# Patient Record
Sex: Female | Born: 1935 | Race: White | Hispanic: No | Smoking: Former smoker
Health system: Southern US, Community
[De-identification: ages and names within clinical notes are randomized; demographics above are authoritative.]

## PROBLEM LIST (undated history)

## (undated) DIAGNOSIS — R002 Palpitations: Secondary | ICD-10-CM

## (undated) DIAGNOSIS — B029 Zoster without complications: Secondary | ICD-10-CM

## (undated) DIAGNOSIS — J189 Pneumonia, unspecified organism: Secondary | ICD-10-CM

## (undated) DIAGNOSIS — K602 Anal fissure, unspecified: Secondary | ICD-10-CM

## (undated) DIAGNOSIS — H409 Unspecified glaucoma: Secondary | ICD-10-CM

## (undated) DIAGNOSIS — E785 Hyperlipidemia, unspecified: Secondary | ICD-10-CM

## (undated) DIAGNOSIS — R0602 Shortness of breath: Secondary | ICD-10-CM

## (undated) DIAGNOSIS — R079 Chest pain, unspecified: Secondary | ICD-10-CM

## (undated) DIAGNOSIS — E039 Hypothyroidism, unspecified: Secondary | ICD-10-CM

## (undated) DIAGNOSIS — I10 Essential (primary) hypertension: Secondary | ICD-10-CM

## (undated) DIAGNOSIS — E119 Type 2 diabetes mellitus without complications: Secondary | ICD-10-CM

## (undated) DIAGNOSIS — E038 Other specified hypothyroidism: Secondary | ICD-10-CM

## (undated) DIAGNOSIS — K219 Gastro-esophageal reflux disease without esophagitis: Secondary | ICD-10-CM

## (undated) DIAGNOSIS — I493 Ventricular premature depolarization: Secondary | ICD-10-CM

## (undated) DIAGNOSIS — L719 Rosacea, unspecified: Secondary | ICD-10-CM

## (undated) HISTORY — DX: Hyperlipidemia, unspecified: E78.5

## (undated) HISTORY — DX: Unspecified glaucoma: H40.9

## (undated) HISTORY — DX: Ventricular premature depolarization: I49.3

## (undated) HISTORY — DX: Zoster without complications: B02.9

## (undated) HISTORY — DX: Other specified hypothyroidism: E03.8

## (undated) HISTORY — DX: Shortness of breath: R06.02

## (undated) HISTORY — DX: Pneumonia, unspecified organism: J18.9

## (undated) HISTORY — DX: Chest pain, unspecified: R07.9

## (undated) HISTORY — DX: Hypothyroidism, unspecified: E03.9

## (undated) HISTORY — PX: APPENDECTOMY: SHX54

## (undated) HISTORY — PX: TOTAL ABDOMINAL HYSTERECTOMY: SHX209

## (undated) HISTORY — PX: BOWEL RESECTION: SHX1257

## (undated) HISTORY — DX: Palpitations: R00.2

## (undated) HISTORY — DX: Anal fissure, unspecified: K60.2

## (undated) HISTORY — PX: BLADDER REPAIR: SHX76

## (undated) HISTORY — DX: Rosacea, unspecified: L71.9

---

## 1998-10-07 ENCOUNTER — Ambulatory Visit (HOSPITAL_COMMUNITY): Admission: RE | Admit: 1998-10-07 | Discharge: 1998-10-08 | Payer: Self-pay | Admitting: Urology

## 2007-05-21 HISTORY — PX: OTHER SURGICAL HISTORY: SHX169

## 2009-04-03 ENCOUNTER — Encounter: Payer: Self-pay | Admitting: Cardiology

## 2009-04-14 DIAGNOSIS — R002 Palpitations: Secondary | ICD-10-CM | POA: Insufficient documentation

## 2009-04-14 DIAGNOSIS — R0602 Shortness of breath: Secondary | ICD-10-CM | POA: Insufficient documentation

## 2009-04-17 ENCOUNTER — Ambulatory Visit: Payer: Self-pay | Admitting: Cardiology

## 2009-04-17 DIAGNOSIS — E119 Type 2 diabetes mellitus without complications: Secondary | ICD-10-CM | POA: Insufficient documentation

## 2009-04-17 DIAGNOSIS — R079 Chest pain, unspecified: Secondary | ICD-10-CM | POA: Insufficient documentation

## 2009-04-17 DIAGNOSIS — E785 Hyperlipidemia, unspecified: Secondary | ICD-10-CM | POA: Insufficient documentation

## 2009-04-30 ENCOUNTER — Telehealth (INDEPENDENT_AMBULATORY_CARE_PROVIDER_SITE_OTHER): Payer: Self-pay | Admitting: *Deleted

## 2009-05-01 ENCOUNTER — Encounter (HOSPITAL_COMMUNITY): Admission: RE | Admit: 2009-05-01 | Discharge: 2009-06-25 | Payer: Self-pay | Admitting: Cardiology

## 2009-05-01 ENCOUNTER — Ambulatory Visit: Payer: Self-pay

## 2009-05-01 ENCOUNTER — Ambulatory Visit: Payer: Self-pay | Admitting: Cardiology

## 2009-05-01 ENCOUNTER — Encounter: Payer: Self-pay | Admitting: Cardiology

## 2009-05-01 ENCOUNTER — Ambulatory Visit (HOSPITAL_COMMUNITY): Admission: RE | Admit: 2009-05-01 | Discharge: 2009-05-01 | Payer: Self-pay | Admitting: Cardiology

## 2009-05-02 ENCOUNTER — Telehealth (INDEPENDENT_AMBULATORY_CARE_PROVIDER_SITE_OTHER): Payer: Self-pay | Admitting: *Deleted

## 2009-05-02 ENCOUNTER — Ambulatory Visit: Payer: Self-pay | Admitting: Internal Medicine

## 2009-05-05 LAB — CONVERTED CEMR LAB
Eosinophils Relative: 1.2 % (ref 0.0–5.0)
HCT: 38.9 % (ref 36.0–46.0)
Hemoglobin: 13 g/dL (ref 12.0–15.0)
Lymphocytes Relative: 47.3 % — ABNORMAL HIGH (ref 12.0–46.0)
Lymphs Abs: 4.1 10*3/uL — ABNORMAL HIGH (ref 0.7–4.0)
Monocytes Relative: 8.2 % (ref 3.0–12.0)
Platelets: 335 10*3/uL (ref 150.0–400.0)
Sed Rate: 22 mm/hr (ref 0–22)
WBC: 8.6 10*3/uL (ref 4.5–10.5)

## 2009-10-28 ENCOUNTER — Ambulatory Visit: Payer: Self-pay

## 2010-04-23 NOTE — Assessment & Plan Note (Signed)
Summary: Cardiology Nuclear Study  Nuclear Med Background Indications for Stress Test: Evaluation for Ischemia   History: Echo, GXT  History Comments: '07 GXT: no definite abnormalities; 05/01/09 Echo:EF=60-65%  Symptoms: Chest Pain with Exertion, Chest Pressure, Chest Pressure with Exertion, DOE, Palpitations, Rapid HR  Symptoms Comments: Last episode of IO:NGEX night.   Nuclear Pre-Procedure Cardiac Risk Factors: Lipids, NIDDM Caffeine/Decaff Intake: None NPO After: 6:30 PM Lungs: Clear IV 0.9% NS with Angio Cath: 20g     IV Site: (R) AC IV Started by: Irean Hong RN Chest Size (in) 38     Cup Size B     Height (in): 66 Weight (lb): 158 BMI: 25.59 Tech Comments: While waiting of stress images patient c/o throat tightness, chest and abdominal pain.  This was all relieved by the time she was finished with the images.  Patient stated she was feeling fine.  Nuclear Med Study 1 or 2 day study:  1 day     Stress Test Type:  Eugenie Birks Reading MD:  Marca Ancona, MD     Referring MD:  Willa Rough, MD Resting Radionuclide:  Technetium 74m Tetrofosmin     Resting Radionuclide Dose:  10.9 mCi  Stress Radionuclide:  Technetium 54m Tetrofosmin     Stress Radionuclide Dose:  33.0 mCi   Stress Protocol   Lexiscan: 0.4 mg   Stress Test Technologist:  Rea College CMA-N     Nuclear Technologist:  Burna Mortimer Deal RT-N  Rest Procedure  Myocardial perfusion imaging was performed at rest 45 minutes following the intravenous administration of Myoview Technetium 85m Tetrofosmin.  Stress Procedure  The patient received IV Lexiscan 0.4 mg over 15-seconds.  Myoview injected at 30-seconds.  There were nonspecific T-wave changes and frequent PVC's with LBBB morphology with lexiscan.  Quantitative spect images were obtained after a 45 minute delay.  QPS Raw Data Images:  Normal; no motion artifact; normal heart/lung ratio. Stress Images:  NI: Uniform and normal uptake of tracer in all myocardial  segments. Rest Images:  Normal homogeneous uptake in all areas of the myocardium. Subtraction (SDS):  There is no evidence of scar or ischemia. Transient Ischemic Dilatation:  .87  (Normal <1.22)  Lung/Heart Ratio:  .28  (Normal <0.45)  Quantitative Gated Spect Images QGS EDV:  62 ml QGS ESV:  20 ml QGS EF:  69 % QGS cine images:  Normal wall motion   Overall Impression  Exercise Capacity: Lexiscan stress BP Response: Normal blood pressure response. Clinical Symptoms: Nausea, dizzy with infusion. Throat tightness while waiting for stress images.  ECG Impression: Intermittent left bundle with infusion.  Overall Impression: No evidence for ischemia or infarction Overall Impression Comments: Intermittent left bundle with infusion.   Appended Document: Cardiology Nuclear Study Good  Appended Document: Cardiology Nuclear Study Left message to call back   Appended Document: Cardiology Nuclear Study spoke w/pt she is aware of results

## 2010-04-23 NOTE — Progress Notes (Signed)
Summary: Nuclear Pre-Procedure  Phone Note Outgoing Call Call back at [facility] Phone [phone]   Call placed by: Stanton Kidney, EMT-P,  April 30, 2009 3:00 PM Call placed to: Patient Action Taken: Phone Call Completed Summary of Call: Reviewed information on Myoview Information Sheet (see scanned document for further details).  Spoke with Patient.    Nuclear Med Background Indications for Stress Test: Evaluation for Ischemia   History: GXT  History Comments: '07 GXT: no definite abnormalities  Symptoms: Chest Pain with Exertion, DOE, Palpitations    Nuclear Pre-Procedure Cardiac Risk Factors: Lipids, NIDDM Height (in): 66

## 2010-04-23 NOTE — Assessment & Plan Note (Signed)
Summary: NP6/PALPS/SOB/PER DR Angelina Ok PT TO SEE DR Myrtis Ser   Visit Type:  Initial Consult Primary Provider:  Harrison Mons, MD  CC:  chest pain.  History of Present Illness: The patient is seen to evaluate chest pain and palpitations.  I have excellent information from her primary physician, Dr.Shaw.  The patient has diabetes.  She tells me that she had a standard exercise test in 2007.  At that time there was no definite abnormality.  More recently she feels palpitations.  She has not had syncope or presyncope.  She also says that she feels uncomfortable if she rushes with her activities.  If she climbs a set of stairs rapidly she feels some shortness of breath and an unusual sensation in her throat.  This passes with time.  There is no radiation of discomfort.  There is no nausea vomiting or diaphoresis.  The patient does have diabetes.  Current Medications (verified): 1)  Aspirin 81 Mg Tbec (Aspirin) .... Take One Tablet By Mouth Daily 2)  Co Q-10 30 Mg  Caps (Coenzyme Q10) .... 50 Mg Once Daily 3)  Fish Oil   Oil (Fish Oil) .Marland Kitchen.. 1000 Mg Once Daily 4)  Vitamin E 600 Unit  Caps (Vitamin E) .... 400 International Units Once Daily 5)  Tetrahydrazoline Hcl 0.05 % Soln (Tetrahydrozoline Hcl) .... As Directed 6)  Glucophage 1000 Mg Tabs (Metformin Hcl) .... Two Times A Day 7)  Singulair 10 Mg Tabs (Montelukast Sodium) .... Once Daily 8)  Metrocream 0.75 % Crea (Metronidazole) .... Two Times A Day 9)  Lumigan 0.03 % Soln (Bimatoprost) .... At Bedtime  Allergies (verified): 1)  ! Sulfa 2)  ! Pcn 3)  ! 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  Alcohol Use - no Drug Use - no Full Time---- CNA (ederly /  alzheimers pts)  Review of Systems       Patient denies fever, chills, headache, sweats, rash, change in vision, change in hearing, cough, nausea vomiting, urinary symptoms, musculoskeletal problems.  All other systems are reviewed and are negative.  Vital Signs:  Patient profile:   77 year old female Height:      66 inches Weight:      159 pounds BMI:     25.76 Pulse rate:   68 / minute BP sitting:   120 / 78  (left arm) Cuff size:   regular  Vitals Entered By: Hardin Negus, RMA (April 17, 2009 10:24 AM)  Physical Exam  General:  patient is quite stable in general. Head:  head is atraumatic. Eyes:  no xanthelasma. Neck:  no jugular venous distention.  No carotid bruits. Chest Wall:  no chest wall tenderness. Lungs:  lungs are clear.  Respiratory effort is nonlabored. Heart:  cardiac exam reveals S1-S2.  No clicks or significant murmurs. Abdomen:  abdomen is soft. Msk:  no musculoskeletal deformities. Extremities:  no peripheral edema. Skin:  no skin rashes. Psych:  patient is oriented to person time and place.  Affect is normal.   Impression & Recommendations:  Problem # 1:  CHEST PAIN-UNSPECIFIED (ICD-786.50)  The patient has chest discomfort  after walking up stairs.  She describes a sensation in her throat that could possibly be an anginal equivalent.  By report she had a normal general exercise test in 2007.  She does have diabetes.  I do feel that we should assess her further to rule out significant ischemia.  She would prefer not to walk on a treadmill.  We will do a pharmacologic nuclear stress.  Also because she has shortness of breath we need to be sure that she has good LV function.  Two-dimensional echo will be done to  assess LV function and valvular function.  I have reviewed the EKG dated April 03, 2009.  There are no significant abnormalities.  There is sinus rhythm.  I have also reviewed all of the information sent from Dr.Shaw's office.    Orders: Echocardiogram (Echo) Nuclear Stress Test (Nuc Stress Test)  Problem # 2:  HYPERLIPIDEMIA-MIXED (ICD-272.4) The patient is receiving medications for lipids.  No change in therapy.  Problem # 3:  DM (ICD-250.00)  Her updated medication list for this problem includes:    Aspirin 81 Mg Tbec (Aspirin) .Marland Kitchen... Take one tablet by mouth daily    Glucophage 1000 Mg Tabs (Metformin hcl) .Marland Kitchen..Marland Kitchen Two times a day The patient's diabetes is well treated by her primary physician.  No change in therapy.  Problem # 4:  PALPITATIONS (ICD-785.1) The patient has palpitations.  She has a history of some PVCs in the past.  I discussed this issue with her.  I doubt she is having significant arrhythmias.  I chosen not to proceed with a Holter monitor.  We can make further decisions about her rhythm after we know more about her LV function.  After her studies are done I will review them with her in person.  Patient Instructions: 1)  Your physician has requested that you have an echocardiogram.  Echocardiography is a painless test that uses sound waves to create images of your heart. It provides your doctor with information about the size and shape of your heart and how well your heart's chambers and valves are working.  This procedure takes approximately one hour. There are no restrictions for this procedure. 2)  Your physician has requested that you have a Lexiscan myoview.  For further information please visit [email or web address]  Please follow instruction sheet, as given. 3)  Follow up in 3 weeks

## 2010-04-23 NOTE — Letter (Signed)
Summary: [facility] Office Note  [facility] Office Note   Imported By: Roderic Ovens 04/29/2009 13:44:21  _____________________________________________________________________  External Attachment:    Type:   Image     Comment:   External Document

## 2010-04-23 NOTE — Assessment & Plan Note (Signed)
Summary: rov   Visit Type:  Follow-up ADD ON _-DOD Primary Provider:  Harrison Mons, MD  CC:  pt continues to have sob.  History of Present Illness: Patient is a 77 year old with a history of diabetes,  allergies.  She was recently seen by Lovena Neighbours for shortness of breath and chest tightness (refer to his dictation).  She was sched for an echo and a myoview scan which she had done yesterday.  The echo showed normal LV function and no significant valvular abnormalities.  The patient had a [employer].  She developed nausa and shrotness of breath during the infusion and wihile waiting for the stress images.  Per the patient she continued to feel poorly.  She was taken to her car by wheelchair and went home.  Over the past day, she continues to have shortness of breath.  She also complains of some aching in her legs.   She called back earlier today and was scehduled to be seen.   Shee denies rash, itching, wheezing, swelling.  No diarrhea.  Does note some back pain.  Current Medications (verified): 1)  Aspirin 81 Mg Tbec (Aspirin) .... Take One Tablet By Mouth Daily 2)  Co Q-10 30 Mg  Caps (Coenzyme Q10) .... 50 Mg Once Daily 3)  Fish Oil   Oil (Fish Oil) .Marland Kitchen.. 1000 Mg Once Daily 4)  Vitamin E 600 Unit  Caps (Vitamin E) .... 400 International Units Once Daily 5)  Glucophage 1000 Mg Tabs (Metformin Hcl) .... Two Times A Day 6)  Singulair 10 Mg Tabs (Montelukast Sodium) .... Once Daily 7)  Metrocream 0.75 % Crea (Metronidazole) .... Two Times A Day 8)  Lumigan 0.03 % Soln (Bimatoprost) .... At Bedtime 9)  Vivelle Dot .... As Directed 10)  Vit C .... Daily  Allergies (verified): 1)  ! Sulfa 2)  ! Pcn 3)  ! 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  Alcohol Use - no Drug Use - no Full Time---- CNA (ederly /  alzheimers pts)  Review of Systems       All systems reviewed.  Negative to the above problem except as noted above.  Vital Signs:  Patient profile:   77 year old female Height:      66 inches Weight:      159 pounds BMI:     25.76 Pulse rate:   72 / minute BP sitting:   174 / 88  (left arm) Cuff size:   regular  Vitals Entered By: Burnett Kanaris, CNA (May 02, 2009 11:50 AM)  Physical Exam  Additional Exam:  HEENT:  Normocephalic, atraumatic. EOMI, PERRLA.  Neck: JVP is normal. No thyromegaly. No bruits.  Lungs: Decreased expiratory flow.  No wheezes or rales. Heart: Regular rate and rhythm. Normal S1, S2. No S3.   No significant murmurs. PMI not displaced. Back:  Tender in L paravertebral area to percussion.  Abdomen:  Supple, nontender. Normal bowel sounds. No masses. No hepatomegaly.  Extremities:   Good distal pulses throughout. No lower extremity edema.  Musculoskeletal :moving all  extremities.  Neuro:   alert and oriented x3.    Impression & Recommendations:  Problem # 1:  DYSPNEA (ICD-786.05) The patient returns today saying she has felt bad since her myoview yesterday.  ON exam, the only remarkable finding is decreased airflow on respiratory exam.  No wheezes.  I have reviewed  the studies she has had done.  They are overall normal.  I am not convinced that this is a false negative.  I am not convinced that this is an allergic reaction to the lexiscan or myoview.   I would recomm that we get a CBC and ESR.  I would also set the patient up for  PFTs.  Problem # 2:  CHEST PAIN-UNSPECIFIED (ICD-786.50) Refer to #!.    continue ASA for now. Her updated medication list for this problem includes:    Aspirin 81 Mg Tbec (Aspirin) .Marland Kitchen... Take one tablet by mouth daily  Other Orders: TLB-CBC Platelet - w/Differential (85025-CBCD) TLB-Sedimentation Rate (ESR) (85652-ESR) Pulmonary Function Test (PFT)  Patient Instructions: 1)  Your physician recommends that you return for lab work in: lab work today .Marland Kitchenwe will call you with results 2)  Your physician has recommended that you have a pulmonary function test.  Pulmonary Function Tests are a group of tests that measure how well air moves in and out of your lungs.  Appended Document: rov Patient had cancelled her PFT appointment.

## 2010-04-23 NOTE — Progress Notes (Signed)
  Phone Note Outgoing Call   Call placed by: Scherrie Bateman, LPN,  May 02, 2009 9:43 AM Summary of Call: CALLED PT  RE  LEFT MESSAGE WITH ANSWERING SERVICE LAST NIGHT NO RETURN CALL THIS AM. PT HAD LEXISCAN HERE YESTERDAY HAD TO LEAVE OFFICE  ON STRETCHER C/O CHEST AND THROAT TIGHTNESS AS WELL AS NAUSEA AFTER LEXISCAN. SPOKE WITH PT THIS AM CONT TO C/O CHEST HEAVINESS H/A SOB "CAN'T GET DEEP BREATH" "DOESN'T FEEL RIGHT" SUGAR THIS AM WAS 169 NEVER RUNS THAT HIGH PER PT. WHILE ON PHONE HAD PT CHECK B/P. B/P RUNNING 185/100.WAS GIVEN PHENEGRAN FOR NAUSEA YESTERDAY.NO C/O RASH HIVES OR ITCHING Initial call taken by: Scherrie Bateman, LPN,  May 02, 2009 9:51 AM  Follow-up for Phone Call        pt son in law just called and states that she is still not doing well at all. would like for someone to call and talk to the pt and pt daughter [phone]  May 02, 2009 9:51 AM    Additional Follow-up for Phone Call Additional follow up Details #2::    SPOKE WITH DR Tenny Craw RE PT'S S/S PER DR ROSS WILL SEE PT  TODAY. CALLED PT, DAUGHTER ANSWERED PHONE INSTRUCTED TO BRING MOTHER TO OFFICE TO BE EVALUATED BY DR ROSS .VERBALIZED UNDERSTANDING. Follow-up by: Scherrie Bateman, LPN,  May 02, 2009 10:06 AM

## 2010-09-25 ENCOUNTER — Encounter: Payer: Self-pay | Admitting: Internal Medicine

## 2013-05-29 LAB — IFOBT (OCCULT BLOOD): IFOBT: POSITIVE

## 2013-06-01 ENCOUNTER — Observation Stay (HOSPITAL_COMMUNITY)
Admission: EM | Admit: 2013-06-01 | Discharge: 2013-06-03 | DRG: 060 | Disposition: A | Discharge status: Home / Self Care | Payer: Medicare Other | Attending: Internal Medicine | Admitting: Internal Medicine

## 2013-06-01 ENCOUNTER — Encounter (HOSPITAL_COMMUNITY): Payer: Self-pay | Admitting: Emergency Medicine

## 2013-06-01 ENCOUNTER — Inpatient Hospital Stay (HOSPITAL_COMMUNITY): Payer: Medicare Other

## 2013-06-01 DIAGNOSIS — G119 Hereditary ataxia, unspecified: Principal | ICD-10-CM | POA: Diagnosis present

## 2013-06-01 DIAGNOSIS — E785 Hyperlipidemia, unspecified: Secondary | ICD-10-CM | POA: Diagnosis present

## 2013-06-01 DIAGNOSIS — R197 Diarrhea, unspecified: Secondary | ICD-10-CM | POA: Diagnosis present

## 2013-06-01 DIAGNOSIS — R270 Ataxia, unspecified: Secondary | ICD-10-CM

## 2013-06-01 DIAGNOSIS — I951 Orthostatic hypotension: Secondary | ICD-10-CM

## 2013-06-01 DIAGNOSIS — I10 Essential (primary) hypertension: Secondary | ICD-10-CM | POA: Diagnosis present

## 2013-06-01 DIAGNOSIS — E119 Type 2 diabetes mellitus without complications: Secondary | ICD-10-CM | POA: Diagnosis present

## 2013-06-01 DIAGNOSIS — E039 Hypothyroidism, unspecified: Secondary | ICD-10-CM | POA: Diagnosis present

## 2013-06-01 HISTORY — DX: Type 2 diabetes mellitus without complications: E11.9

## 2013-06-01 HISTORY — DX: Ataxia, unspecified: R27.0

## 2013-06-01 HISTORY — DX: Essential (primary) hypertension: I10

## 2013-06-01 HISTORY — DX: Orthostatic hypotension: I95.1

## 2013-06-01 LAB — CBC WITH DIFFERENTIAL/PLATELET
Basophils Absolute: 0 10*3/uL (ref 0.0–0.1)
Basophils Relative: 0 % (ref 0–1)
EOS ABS: 0.1 10*3/uL (ref 0.0–0.7)
EOS PCT: 1 % (ref 0–5)
HEMATOCRIT: 38.9 % (ref 36.0–46.0)
Hemoglobin: 13.4 g/dL (ref 12.0–15.0)
LYMPHS ABS: 4.4 10*3/uL — AB (ref 0.7–4.0)
LYMPHS PCT: 43 % (ref 12–46)
MCH: 30.9 pg (ref 26.0–34.0)
MCHC: 34.4 g/dL (ref 30.0–36.0)
MCV: 89.6 fL (ref 78.0–100.0)
MONO ABS: 1.1 10*3/uL — AB (ref 0.1–1.0)
Monocytes Relative: 10 % (ref 3–12)
Neutro Abs: 4.6 10*3/uL (ref 1.7–7.7)
Neutrophils Relative %: 45 % (ref 43–77)
PLATELETS: 322 10*3/uL (ref 150–400)
RBC: 4.34 MIL/uL (ref 3.87–5.11)
RDW: 13.2 % (ref 11.5–15.5)
WBC: 10.2 10*3/uL (ref 4.0–10.5)

## 2013-06-01 LAB — COMPREHENSIVE METABOLIC PANEL
ALBUMIN: 3.7 g/dL (ref 3.5–5.2)
ALT: 17 U/L (ref 0–35)
AST: 19 U/L (ref 0–37)
Alkaline Phosphatase: 65 U/L (ref 39–117)
BUN: 19 mg/dL (ref 6–23)
CHLORIDE: 102 meq/L (ref 96–112)
CO2: 24 mEq/L (ref 19–32)
Calcium: 9.8 mg/dL (ref 8.4–10.5)
Creatinine, Ser: 0.82 mg/dL (ref 0.50–1.10)
GFR calc Af Amer: 77 mL/min — ABNORMAL LOW (ref >90)
GFR calc non Af Amer: 67 mL/min — ABNORMAL LOW (ref >90)
Glucose, Bld: 134 mg/dL — ABNORMAL HIGH (ref 70–99)
Potassium: 3.8 mEq/L (ref 3.7–5.3)
SODIUM: 142 meq/L (ref 137–147)
TOTAL PROTEIN: 7 g/dL (ref 6.0–8.3)
Total Bilirubin: <0.2 mg/dL — ABNORMAL LOW (ref 0.3–1.2)

## 2013-06-01 LAB — URINE MICROSCOPIC-ADD ON

## 2013-06-01 LAB — URINALYSIS, ROUTINE W REFLEX MICROSCOPIC
Bilirubin Urine: NEGATIVE
GLUCOSE, UA: NEGATIVE mg/dL
Hgb urine dipstick: NEGATIVE
KETONES UR: NEGATIVE mg/dL
Nitrite: NEGATIVE
PROTEIN: NEGATIVE mg/dL
Specific Gravity, Urine: 1.015 (ref 1.005–1.030)
Urobilinogen, UA: 0.2 mg/dL (ref 0.0–1.0)
pH: 5 (ref 5.0–8.0)

## 2013-06-01 LAB — GLUCOSE, CAPILLARY: Glucose-Capillary: 138 mg/dL — ABNORMAL HIGH (ref 70–99)

## 2013-06-01 LAB — I-STAT TROPONIN, ED: Troponin i, poc: 0.01 ng/mL (ref 0.00–0.08)

## 2013-06-01 MED ORDER — CYCLOSPORINE 0.05 % OP EMUL
1.0000 [drp] | Freq: Two times a day (BID) | OPHTHALMIC | Status: DC | PRN
  Filled 2013-06-01: qty 1

## 2013-06-01 MED ORDER — LORAZEPAM 2 MG/ML IJ SOLN
0.5000 mg | Freq: Once | INTRAMUSCULAR | Status: AC
Start: 2013-06-01 — End: 2013-06-01
  Administered 2013-06-01: 0.5 mg via INTRAVENOUS
  Filled 2013-06-01: qty 1

## 2013-06-01 MED ORDER — ALPRAZOLAM 0.5 MG PO TABS
0.5000 mg | ORAL_TABLET | Freq: Every evening | ORAL | Status: DC | PRN
  Administered 2013-06-02: 0.5 mg via ORAL
  Filled 2013-06-01: qty 1

## 2013-06-01 MED ORDER — VITAMIN C 500 MG PO TABS
500.0000 mg | ORAL_TABLET | Freq: Every day | ORAL | Status: DC
  Administered 2013-06-02 – 2013-06-03 (×2): 500 mg via ORAL
  Filled 2013-06-01 (×2): qty 1

## 2013-06-01 MED ORDER — SODIUM CHLORIDE 0.9 % IV BOLUS (SEPSIS)
1000.0000 mL | Freq: Once | INTRAVENOUS | Status: AC
  Administered 2013-06-01: 1000 mL via INTRAVENOUS

## 2013-06-01 MED ORDER — ONDANSETRON HCL 4 MG/2ML IJ SOLN: 4.0000 mg | Freq: Four times a day (QID) | INTRAMUSCULAR | Status: DC | PRN

## 2013-06-01 MED ORDER — SODIUM CHLORIDE 0.9 % IV SOLN
INTRAVENOUS | Status: DC
  Administered 2013-06-01: 60 mL/h via INTRAVENOUS

## 2013-06-01 MED ORDER — VITAMIN D3 25 MCG (1000 UNIT) PO TABS
1000.0000 [IU] | ORAL_TABLET | Freq: Every day | ORAL | Status: DC
  Administered 2013-06-02 – 2013-06-03 (×2): 1000 [IU] via ORAL
  Filled 2013-06-01 (×2): qty 1

## 2013-06-01 MED ORDER — GUAIFENESIN ER 600 MG PO TB12
600.0000 mg | ORAL_TABLET | Freq: Two times a day (BID) | ORAL | Status: DC | PRN
  Filled 2013-06-01: qty 1

## 2013-06-01 MED ORDER — ACETAMINOPHEN 325 MG PO TABS
650.0000 mg | ORAL_TABLET | Freq: Four times a day (QID) | ORAL | Status: DC | PRN
  Administered 2013-06-02 (×2): 650 mg via ORAL
  Filled 2013-06-01 (×2): qty 2

## 2013-06-01 MED ORDER — LOSARTAN POTASSIUM 50 MG PO TABS
50.0000 mg | ORAL_TABLET | Freq: Two times a day (BID) | ORAL | Status: DC
  Administered 2013-06-02 – 2013-06-03 (×4): 50 mg via ORAL
  Filled 2013-06-01 (×5): qty 1

## 2013-06-01 MED ORDER — ONDANSETRON HCL 4 MG PO TABS: 4.0000 mg | ORAL_TABLET | Freq: Four times a day (QID) | ORAL | Status: DC | PRN

## 2013-06-01 MED ORDER — VITAMIN B-12 1000 MCG PO TABS
1000.0000 ug | ORAL_TABLET | Freq: Every day | ORAL | Status: DC
  Administered 2013-06-02 – 2013-06-03 (×2): 1000 ug via ORAL
  Filled 2013-06-01 (×2): qty 1

## 2013-06-01 MED ORDER — METOCLOPRAMIDE HCL 5 MG/ML IJ SOLN
10.0000 mg | Freq: Once | INTRAMUSCULAR | Status: AC
  Administered 2013-06-01: 10 mg via INTRAVENOUS
  Filled 2013-06-01: qty 2

## 2013-06-01 MED ORDER — DESONIDE 0.05 % EX CREA: 1.0000 "application " | TOPICAL_CREAM | Freq: Two times a day (BID) | CUTANEOUS | Status: DC | PRN

## 2013-06-01 MED ORDER — INSULIN ASPART 100 UNIT/ML ~~LOC~~ SOLN: 0.0000 [IU] | Freq: Three times a day (TID) | SUBCUTANEOUS | Status: DC

## 2013-06-01 MED ORDER — MONTELUKAST SODIUM 10 MG PO TABS
10.0000 mg | ORAL_TABLET | Freq: Every day | ORAL | Status: DC
  Administered 2013-06-02 (×2): 10 mg via ORAL
  Filled 2013-06-01 (×3): qty 1

## 2013-06-01 MED ORDER — ACETAMINOPHEN 650 MG RE SUPP: 650.0000 mg | Freq: Four times a day (QID) | RECTAL | Status: DC | PRN

## 2013-06-01 NOTE — H&P (Signed)
Kayla Medina is an 80 y.o. female.   PCP:   Marton Redwood, MD   Chief Complaint:  N/DeH/Orthostasis/Dizzy/Fatigue/Anorexia  HPI: 61 F c DM2 and HTN presents to UC then the ED c 1 week of strange Sxs.  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  He Rxed the Flexeril and it helped.  Sxs changed Late Tuesday c increasing Fatigue.  Worked Tuesday and Thursday 15 hrs plus.  Now c/o of Lightheadedness c N/DeH/Orthostasis/Dizzy/Fatigue/Anorexia.  She has only been able to lie around.  I was called to admit for eval and Rx.  C/O intermittent Frontal HA.  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  When stood up she gets very dizzy, nauseated, and lightheaded. Orthostasis noted. Generalized weakness. Nausea is worse when she stands. Lightheadedness is worse when she stands. She denies pain anywhere denies fever. She was seen in urgent care Center sent here for further evaluation. 2 Zofran, without relief. No other associated symptoms  I was called for inpt admission because she cannot funnction    Past Medical History:  Past Medical History  Diagnosis Date  . Chest pain, unspecified   . Other and unspecified hyperlipidemia   . Shortness of breath   . Palpitations   . Shingles     hx  . Subclinical hypothyroidism   . Diabetes mellitus without complication   . Hypertension     Past Surgical History  Procedure Laterality Date  . Bladder track    . Bowel resection    . Bilateral eye surgery/cataract repair  3/09  . Total abdominal hysterectomy        Allergies:   Allergies  Allergen Reactions  . Codeine Nausea And Vomiting  . Januvia [Sitagliptin] Nausea And Vomiting  . Other Nausea And Vomiting    *all narcotic meds*  . Penicillins Hives  . Sulfonamide Derivatives Hives     Medications: Prior to Admission medications   Medication Sig Start Date End Date Taking?  Authorizing Provider  ALPRAZolam Duanne Moron) 0.5 MG tablet Take 0.5 mg by mouth at bedtime as needed for anxiety.   Yes Historical Provider, MD  cholecalciferol (VITAMIN D) 1000 UNITS tablet Take 1,000 Units by mouth daily.   Yes Historical Provider, MD  Coenzyme Q10 (CO Q-10) 30 MG CAPS Take 30 mg by mouth daily.    Yes Historical Provider, MD  cyclobenzaprine (FLEXERIL) 10 MG tablet Take 10 mg by mouth 3 (three) times daily as needed for muscle spasms.   Yes Historical Provider, MD  cycloSPORINE (RESTASIS) 0.05 % ophthalmic emulsion Place 1 drop into both eyes 2 (two) times daily as needed (for dry eyes).   Yes Historical Provider, MD  desonide (DESOWEN) 0.05 % cream Apply 1 application topically 2 (two) times daily as needed (for itching/dry skin).   Yes Historical Provider, MD  hydrochlorothiazide (MICROZIDE) 12.5 MG capsule Take 12.5 mg by mouth daily.   Yes Historical Provider, MD  losartan (COZAAR) 50 MG tablet Take 50 mg by mouth 2 (two) times daily.   Yes Historical Provider, MD  metFORMIN (GLUCOPHAGE) 1000 MG tablet Take 1,000 mg by mouth 2 (two) times daily.     Yes Historical Provider, MD  montelukast (SINGULAIR) 10 MG tablet Take 10 mg by mouth at bedtime.    Yes Historical Provider, MD  vitamin B-12 (CYANOCOBALAMIN) 1000 MCG tablet Take 1,000 mcg by  mouth daily.   Yes Historical Provider, MD  vitamin C (ASCORBIC ACID) 500 MG tablet Take 500 mg by mouth daily.   Yes Historical Provider, MD  vitamin E 600 UNIT capsule Take 400 Units by mouth daily.     Yes Historical Provider, MD      (Not in a hospital admission)   Social History:  reports that she has never smoked. She does not have any smokeless tobacco history on file. She reports that she does not drink alcohol or use illicit drugs.  Family History: Family History  Problem Relation Age of Onset  . Alzheimer's disease Other   . Hypertension Other     Review of Systems:  Review of Systems - See HPI. Nasal congestion -  nothing new - Mucinex. No other ENT Sx x HA. No CP or SOB No other Ab issues. No pain No Urinary issues - goes often due to lots of water. No Focal weakness. (-) fever.   Physical Exam:  Blood pressure 151/53, pulse 79, temperature 97.9 F (36.6 C), temperature source Oral, resp. rate 14, SpO2 99.00%. Filed Vitals:   06/01/13 1956 06/01/13 1958 06/01/13 2000 06/01/13 2110  BP: 142/61 155/64 157/61 151/53  Pulse: 79     Temp:      TempSrc:      Resp: 18   14  SpO2: 100%   99%   General appearance: Appears fine lying down Head: Normocephalic, without obvious abnormality, atraumatic Eyes: conjunctivae/corneas clear. PERRL, EOM's intact.  Nose: Nares normal. Septum midline. Mucosa normal. No drainage or sinus tenderness. Throat: lips, mucosa, and tongue normal but dry. Neck: no adenopathy, no carotid bruit, no JVD and thyroid not enlarged, symmetric, no tenderness/mass/nodules Resp: CTA B Cardio:  Reg GI: soft, non-tender; bowel sounds normal; no masses,  no organomegaly Extremities: extremities normal, atraumatic, no cyanosis or edema Pulses: 2+ and symmetric Lymph nodes: no cervical lymphadenopathy Neurologic: Alert and oriented X 3, normal strength and tone. Normal symmetric reflexes.  Becomes lightheaded and nauseated upon standing gait is unsteady. Finger-to-nose is normal heel-to-shin normal. Gait is unsteady and broad-based.     Labs on Admission:   Recent Labs  06/01/13 1823  NA 142  K 3.8  CL 102  CO2 24  GLUCOSE 134*  BUN 19  CREATININE 0.82  CALCIUM 9.8    Recent Labs  06/01/13 1823  AST 19  ALT 17  ALKPHOS 65  BILITOT <0.2*  PROT 7.0  ALBUMIN 3.7   No results found for this basename: LIPASE, AMYLASE,  in the last 72 hours  Recent Labs  06/01/13 1823  WBC 10.2  NEUTROABS 4.6  HGB 13.4  HCT 38.9  MCV 89.6  PLT 322   No results found for this basename: CKTOTAL, CKMB, CKMBINDEX, TROPONINI,  in the last 72 hours No results found for this  basename: INR,  PROTIME     LAB RESULT POCT:  Results for orders placed during the hospital encounter of 06/01/13  COMPREHENSIVE METABOLIC PANEL      Result Value Ref Range   Sodium 142  137 - 147 mEq/L   Potassium 3.8  3.7 - 5.3 mEq/L   Chloride 102  96 - 112 mEq/L   CO2 24  19 - 32 mEq/L   Glucose, Bld 134 (*) 70 - 99 mg/dL   BUN 19  6 - 23 mg/dL   Creatinine, Ser 0.82  0.50 - 1.10 mg/dL   Calcium 9.8  8.4 - 10.5 mg/dL   Total  Protein 7.0  6.0 - 8.3 g/dL   Albumin 3.7  3.5 - 5.2 g/dL   AST 19  0 - 37 U/L   ALT 17  0 - 35 U/L   Alkaline Phosphatase 65  39 - 117 U/L   Total Bilirubin <0.2 (*) 0.3 - 1.2 mg/dL   GFR calc non Af Amer 67 (*) >90 mL/min   GFR calc Af Amer 77 (*) >90 mL/min  CBC WITH DIFFERENTIAL      Result Value Ref Range   WBC 10.2  4.0 - 10.5 K/uL   RBC 4.34  3.87 - 5.11 MIL/uL   Hemoglobin 13.4  12.0 - 15.0 g/dL   HCT 38.9  36.0 - 46.0 %   MCV 89.6  78.0 - 100.0 fL   MCH 30.9  26.0 - 34.0 pg   MCHC 34.4  30.0 - 36.0 g/dL   RDW 13.2  11.5 - 15.5 %   Platelets 322  150 - 400 K/uL   Neutrophils Relative % 45  43 - 77 %   Neutro Abs 4.6  1.7 - 7.7 K/uL   Lymphocytes Relative 43  12 - 46 %   Lymphs Abs 4.4 (*) 0.7 - 4.0 K/uL   Monocytes Relative 10  3 - 12 %   Monocytes Absolute 1.1 (*) 0.1 - 1.0 K/uL   Eosinophils Relative 1  0 - 5 %   Eosinophils Absolute 0.1  0.0 - 0.7 K/uL   Basophils Relative 0  0 - 1 %   Basophils Absolute 0.0  0.0 - 0.1 K/uL  URINALYSIS, ROUTINE W REFLEX MICROSCOPIC      Result Value Ref Range   Color, Urine YELLOW  YELLOW   APPearance CLEAR  CLEAR   Specific Gravity, Urine 1.015  1.005 - 1.030   pH 5.0  5.0 - 8.0   Glucose, UA NEGATIVE  NEGATIVE mg/dL   Hgb urine dipstick NEGATIVE  NEGATIVE   Bilirubin Urine NEGATIVE  NEGATIVE   Ketones, ur NEGATIVE  NEGATIVE mg/dL   Protein, ur NEGATIVE  NEGATIVE mg/dL   Urobilinogen, UA 0.2  0.0 - 1.0 mg/dL   Nitrite NEGATIVE  NEGATIVE   Leukocytes, UA TRACE (*) NEGATIVE  URINE  MICROSCOPIC-ADD ON      Result Value Ref Range   Squamous Epithelial / LPF FEW (*) RARE   WBC, UA 0-2  <3 WBC/hpf   Bacteria, UA RARE  RARE   Urine-Other MUCOUS PRESENT    I-STAT TROPOININ, ED      Result Value Ref Range   Troponin i, poc 0.01  0.00 - 0.08 ng/mL   Comment 3               Radiological Exams on Admission: No results found.    Orders placed during the hospital encounter of 06/01/13  . EKG 12-LEAD  . EKG 12-LEAD   Sinus rhythm Multiple ventricular premature    Assessment/Plan Active Problems:   DM   Orthostasis   Ataxia  N/DeH/Orthostasis/Dizzy/Fatigue/Anorexia and ? Cerebellar Ataxia - Agree c MRI to eval for Post CVA. No Pains to suggest Carotid or Vertebral dissection. Get am Cortisol but doubt adrenal issues as electrolytes are fine. May need Neuro consult. No Arrhythmias seen. No Sxs of illness to suggest viral issues. Continue fluids. Hold flexeril, HCT, Metformin for now. OOB c assist only. May need PT/OT Tele. No lab or urine abnormalities. Check TSH  DM2 - SSI - light. Hold metformin.  HTN - Hold Diuretic and eval  and adjust in am.  DVT Proph - SCDs  Nelida Mandarino M 06/01/2013, 9:21 PM

## 2013-06-01 NOTE — ED Notes (Signed)
Per EMS, pt coming from UC on [address]. Pt with c/o gradual onset of fatigue that started one week ago and nausea that started 2 days ago. Pt denies pain. Pt given ODT Zofran at UC. Pt denies nausea. CBG 287.

## 2013-06-01 NOTE — ED Notes (Signed)
Patient transported to MRI 

## 2013-06-01 NOTE — ED Provider Notes (Signed)
CSN: [id number]     Arrival date & time 06/01/13  1643 History   First MD Initiated Contact with Patient 06/01/13 1725     Chief Complaint  Patient presents with  . Fatigue  . Weakness  . Nausea     (Consider location/radiation/quality/duration/timing/severity/associated sxs/prior Treatment) HPI Complains of generalized weakness for 2 days accompanied by nausea and lightheadedness. Nausea is worse when she stands lightheadedness is worse when she stands. She denies pain anywhere denies fever. Admits to diarrhea 2 episodes yesterday one episode today. She suffers from chronic diarrhea for several years. He was seen in urgent care Center sent here for further evaluation. 2 Zofran, without relief. No chest pain no abdominal pain no headache no shortness of breath. No other associated symptoms. Past Medical History  Diagnosis Date  . Chest pain, unspecified   . Other and unspecified hyperlipidemia   . Shortness of breath   . Palpitations   . Shingles     hx  . Subclinical hypothyroidism   . Diabetes mellitus without complication   . Hypertension    Past Surgical History  Procedure Laterality Date  . Bladder track    . Bowel resection    . Bilateral eye surgery/cataract repair  3/09  . Total abdominal hysterectomy     Family History  Problem Relation Age of Onset  . Alzheimer's disease Other   . Hypertension Other    History  Substance Use Topics  . Smoking status: Never Smoker   . Smokeless tobacco: Not on file  . Alcohol Use: No   OB History   Grav Para Term Preterm Abortions TAB SAB Ect Mult Living                 Review of Systems  Constitutional: Negative.   HENT: Negative.   Respiratory: Negative.   Cardiovascular: Negative.   Gastrointestinal: Positive for nausea and diarrhea.  Musculoskeletal: Negative.   Skin: Negative.   Neurological: Positive for light-headedness.  Psychiatric/Behavioral: Negative.   All other systems reviewed and are  negative.      Allergies  Codeine; Januvia; Other; Penicillins; and Sulfonamide derivatives  Home Medications   Current Outpatient Rx  Name  Route  Sig  Dispense  Refill  . ALPRAZolam (XANAX) 0.5 MG tablet   Oral   Take 0.5 mg by mouth at bedtime as needed for anxiety.         . cholecalciferol (VITAMIN D) 1000 UNITS tablet   Oral   Take 1,000 Units by mouth daily.         . Coenzyme Q10 (CO Q-10) 30 MG CAPS   Oral   Take 30 mg by mouth daily.          . cyclobenzaprine (FLEXERIL) 10 MG tablet   Oral   Take 10 mg by mouth 3 (three) times daily as needed for muscle spasms.         . cycloSPORINE (RESTASIS) 0.05 % ophthalmic emulsion   Both Eyes   Place 1 drop into both eyes 2 (two) times daily as needed (for dry eyes).         Marland Kitchen desonide (DESOWEN) 0.05 % cream   Topical   Apply 1 application topically 2 (two) times daily as needed (for itching/dry skin).         . hydrochlorothiazide (MICROZIDE) 12.5 MG capsule   Oral   Take 12.5 mg by mouth daily.         Marland Kitchen losartan (COZAAR) 50 MG tablet  Oral   Take 50 mg by mouth 2 (two) times daily.         . metFORMIN (GLUCOPHAGE) 1000 MG tablet   Oral   Take 1,000 mg by mouth 2 (two) times daily.           . montelukast (SINGULAIR) 10 MG tablet   Oral   Take 10 mg by mouth at bedtime.          . vitamin B-12 (CYANOCOBALAMIN) 1000 MCG tablet   Oral   Take 1,000 mcg by mouth daily.         . vitamin C (ASCORBIC ACID) 500 MG tablet   Oral   Take 500 mg by mouth daily.         . vitamin E 600 UNIT capsule   Oral   Take 400 Units by mouth daily.            BP 133/63  Temp(Src) 97.9 F (36.6 C) (Oral)  Resp 13  SpO2 97% Physical Exam  Nursing note and vitals reviewed. Constitutional: She is oriented to person, place, and time. She appears well-developed and well-nourished.  HENT:  Head: Normocephalic and atraumatic.  Eyes: Conjunctivae are normal. Pupils are equal, round, and  reactive to light.  Neck: Neck supple. No tracheal deviation present. No thyromegaly present.  Cardiovascular: Normal rate and regular rhythm.   No murmur heard. Pulmonary/Chest: Effort normal and breath sounds normal.  Abdominal: Soft. Bowel sounds are normal. She exhibits no distension. There is no tenderness.  Musculoskeletal: Normal range of motion. She exhibits no edema and no tenderness.  Neurological: She is alert and oriented to person, place, and time. She has normal reflexes. No cranial nerve deficit. She exhibits normal muscle tone. Coordination normal.  Becomes lightheaded and nauseated upon standing gait is unsteady. Finger-to-nose is normal heel-to-shin normal. Gait is unsteady and broad-based.  Skin: Skin is warm and dry. No rash noted.  Psychiatric: She has a normal mood and affect.   Results for orders placed during the hospital encounter of 06/01/13  COMPREHENSIVE METABOLIC PANEL      Result Value Ref Range   Sodium 142  137 - 147 mEq/L   Potassium 3.8  3.7 - 5.3 mEq/L   Chloride 102  96 - 112 mEq/L   CO2 24  19 - 32 mEq/L   Glucose, Bld 134 (*) 70 - 99 mg/dL   BUN 19  6 - 23 mg/dL   Creatinine, Ser 0.82  0.50 - 1.10 mg/dL   Calcium 9.8  8.4 - 10.5 mg/dL   Total Protein 7.0  6.0 - 8.3 g/dL   Albumin 3.7  3.5 - 5.2 g/dL   AST 19  0 - 37 U/L   ALT 17  0 - 35 U/L   Alkaline Phosphatase 65  39 - 117 U/L   Total Bilirubin <0.2 (*) 0.3 - 1.2 mg/dL   GFR calc non Af Amer 67 (*) >90 mL/min   GFR calc Af Amer 77 (*) >90 mL/min  CBC WITH DIFFERENTIAL      Result Value Ref Range   WBC 10.2  4.0 - 10.5 K/uL   RBC 4.34  3.87 - 5.11 MIL/uL   Hemoglobin 13.4  12.0 - 15.0 g/dL   HCT 38.9  36.0 - 46.0 %   MCV 89.6  78.0 - 100.0 fL   MCH 30.9  26.0 - 34.0 pg   MCHC 34.4  30.0 - 36.0 g/dL   RDW 13.2  11.5 -  15.5 %   Platelets 322  150 - 400 K/uL   Neutrophils Relative % 45  43 - 77 %   Neutro Abs 4.6  1.7 - 7.7 K/uL   Lymphocytes Relative 43  12 - 46 %   Lymphs Abs 4.4 (*)  0.7 - 4.0 K/uL   Monocytes Relative 10  3 - 12 %   Monocytes Absolute 1.1 (*) 0.1 - 1.0 K/uL   Eosinophils Relative 1  0 - 5 %   Eosinophils Absolute 0.1  0.0 - 0.7 K/uL   Basophils Relative 0  0 - 1 %   Basophils Absolute 0.0  0.0 - 0.1 K/uL  URINALYSIS, ROUTINE W REFLEX MICROSCOPIC      Result Value Ref Range   Color, Urine YELLOW  YELLOW   APPearance CLEAR  CLEAR   Specific Gravity, Urine 1.015  1.005 - 1.030   pH 5.0  5.0 - 8.0   Glucose, UA NEGATIVE  NEGATIVE mg/dL   Hgb urine dipstick NEGATIVE  NEGATIVE   Bilirubin Urine NEGATIVE  NEGATIVE   Ketones, ur NEGATIVE  NEGATIVE mg/dL   Protein, ur NEGATIVE  NEGATIVE mg/dL   Urobilinogen, UA 0.2  0.0 - 1.0 mg/dL   Nitrite NEGATIVE  NEGATIVE   Leukocytes, UA TRACE (*) NEGATIVE  URINE MICROSCOPIC-ADD ON      Result Value Ref Range   Squamous Epithelial / LPF FEW (*) RARE   WBC, UA 0-2  <3 WBC/hpf   Bacteria, UA RARE  RARE   Urine-Other MUCOUS PRESENT    I-STAT TROPOININ, ED      Result Value Ref Range   Troponin i, poc 0.01  0.00 - 0.08 ng/mL   Comment 3            No results found.  ED Course  Procedures (including critical care time) Labs Review Labs Reviewed  COMPREHENSIVE METABOLIC PANEL  CBC WITH DIFFERENTIAL  URINALYSIS, ROUTINE W REFLEX MICROSCOPIC  I-STAT [place], ED   Imaging Review No results found.   EKG Interpretation   Date/Time:  Friday June 01 2013 16:56:05 EDT Ventricular Rate:  68 PR Interval:  164 QRS Duration: 94 QT Interval:  405 QTC Calculation: 431 R Axis:   60 Text Interpretation:  Sinus rhythm Multiple ventricular premature  complexes Premature ventricular complexes New since previous tracing  Confirmed by Nilam Quakenbush  MD, Slyvia Lartigue [phone]) on 06/01/2013 5:00:49 PM       she was treated with 1 L of intravenous normal saline. He a part of a meal. At 8:30 PM she is continues to become nauseated with standing and has a broad-based and unsteady gait. MDM   Final diagnoses:  None   Dr. Virgina Jock  called to evaluate patient for inpatient stay. MRI scan brain ordered as I'm concerned for possible posterior circulation stroke. Symptoms may be consistent with vertigo however she states this does not resemble vertigo but she speaks in the past. Diagnosis ataxia    Orlie Dakin, MD 06/01/13 2102

## 2013-06-02 LAB — CBC
HCT: 34.3 % — ABNORMAL LOW (ref 36.0–46.0)
HEMOGLOBIN: 11.5 g/dL — AB (ref 12.0–15.0)
MCH: 30.4 pg (ref 26.0–34.0)
MCHC: 33.5 g/dL (ref 30.0–36.0)
MCV: 90.7 fL (ref 78.0–100.0)
PLATELETS: 285 10*3/uL (ref 150–400)
RBC: 3.78 MIL/uL — AB (ref 3.87–5.11)
RDW: 13.4 % (ref 11.5–15.5)
WBC: 7.8 10*3/uL (ref 4.0–10.5)

## 2013-06-02 LAB — GLUCOSE, CAPILLARY
GLUCOSE-CAPILLARY: 118 mg/dL — AB (ref 70–99)
GLUCOSE-CAPILLARY: 175 mg/dL — AB (ref 70–99)
Glucose-Capillary: 154 mg/dL — ABNORMAL HIGH (ref 70–99)
Glucose-Capillary: 179 mg/dL — ABNORMAL HIGH (ref 70–99)

## 2013-06-02 LAB — COMPREHENSIVE METABOLIC PANEL
ALBUMIN: 3.1 g/dL — AB (ref 3.5–5.2)
ALT: 13 U/L (ref 0–35)
AST: 14 U/L (ref 0–37)
Alkaline Phosphatase: 53 U/L (ref 39–117)
BUN: 18 mg/dL (ref 6–23)
CHLORIDE: 108 meq/L (ref 96–112)
CO2: 23 mEq/L (ref 19–32)
CREATININE: 0.87 mg/dL (ref 0.50–1.10)
Calcium: 8.9 mg/dL (ref 8.4–10.5)
GFR calc Af Amer: 72 mL/min — ABNORMAL LOW (ref >90)
GFR calc non Af Amer: 62 mL/min — ABNORMAL LOW (ref >90)
Glucose, Bld: 149 mg/dL — ABNORMAL HIGH (ref 70–99)
Potassium: 3.9 mEq/L (ref 3.7–5.3)
Sodium: 144 mEq/L (ref 137–147)
Total Bilirubin: 0.2 mg/dL — ABNORMAL LOW (ref 0.3–1.2)
Total Protein: 5.8 g/dL — ABNORMAL LOW (ref 6.0–8.3)

## 2013-06-02 LAB — TSH: TSH: 9.176 u[IU]/mL — AB (ref 0.350–4.500)

## 2013-06-02 LAB — HEMOGLOBIN A1C
HEMOGLOBIN A1C: 8.4 % — AB (ref <5.7)
MEAN PLASMA GLUCOSE: 194 mg/dL — AB (ref <117)

## 2013-06-02 MED ORDER — ENOXAPARIN SODIUM 40 MG/0.4ML ~~LOC~~ SOLN
40.0000 mg | SUBCUTANEOUS | Status: DC
  Administered 2013-06-02: 40 mg via SUBCUTANEOUS
  Filled 2013-06-02 (×2): qty 0.4

## 2013-06-02 MED ORDER — HYDROCORTISONE 1 % EX CREA
TOPICAL_CREAM | Freq: Two times a day (BID) | CUTANEOUS | Status: DC | PRN
Start: 2013-06-02 — End: 2013-06-03
  Filled 2013-06-02: qty 28

## 2013-06-02 MED ORDER — MECLIZINE HCL 25 MG PO TABS
25.0000 mg | ORAL_TABLET | Freq: Three times a day (TID) | ORAL | Status: DC
  Administered 2013-06-02 – 2013-06-03 (×4): 25 mg via ORAL
  Filled 2013-06-02 (×6): qty 1

## 2013-06-02 NOTE — Progress Notes (Signed)
CSW (Clinical Education officer, museum) received consult for "home health services". This is a CM role. CSW left message for weekend CM to notify. No current CSW needs.   [place], [place]

## 2013-06-02 NOTE — Progress Notes (Signed)
Subjective: No N/V Doing well in bed this am. She is happy she did not have a CVA and MRI was (-) PT note read.  Objective: Vital signs in last 24 hours: Temp:  [97.8 F (36.6 C)-98 F (36.7 C)] 98 F (36.7 C) (03/14 0428) Pulse Rate:  [63-86] 63 (03/14 0428) Resp:  [11-21] 16 (03/14 0428) BP: (118-157)/(40-78) 132/53 mmHg (03/14 0428) SpO2:  [96 %-100 %] 96 % (03/14 0428) Weight:  [75.524 kg (166 lb 8 oz)] 75.524 kg (166 lb 8 oz) (03/13 2245) Weight change:  Last BM Date: 06/01/13  CBG (last 3)   Recent Labs  06/01/13 2255 06/02/13 0735  GLUCAP 138* 118*    Intake/Output from previous day:  Intake/Output Summary (Last 24 hours) at 06/02/13 1046 Last data filed at 06/02/13 0700  Gross per 24 hour  Intake    456 ml  Output    400 ml  Net     56 ml   03/13 0701 - 03/14 0700 In: 789 [I.V.:456] Out: 400 [Urine:400]   Physical Exam  General appearance: Looks good in bed. Eyes: L Lat Gaze causes dizzieness Throat: oropharynx moist without erythema Resp: CTA Cardio: reg GI: soft, non-tender; bowel sounds normal; no masses,  no organomegaly Extremities: no clubbing, cyanosis or edema Dizzy c standing   Lab Results:  Recent Labs  06/01/13 1823 06/02/13 0558  NA 142 144  K 3.8 3.9  CL 102 108  CO2 24 23  GLUCOSE 134* 149*  BUN 19 18  CREATININE 0.82 0.87  CALCIUM 9.8 8.9     Recent Labs  06/01/13 1823 06/02/13 0558  AST 19 14  ALT 17 13  ALKPHOS 65 53  BILITOT <0.2* 0.2*  PROT 7.0 5.8*  ALBUMIN 3.7 3.1*     Recent Labs  06/01/13 1823 06/02/13 0558  WBC 10.2 7.8  NEUTROABS 4.6  --   HGB 13.4 11.5*  HCT 38.9 34.3*  MCV 89.6 90.7  PLT 322 285    No results found for this basename: INR, PROTIME    No results found for this basename: CKTOTAL, CKMB, CKMBINDEX, TROPONINI,  in the last 72 hours  No results found for this basename: TSH, T4TOTAL, FREET3, T3FREE, THYROIDAB,  in the last 72 hours  No results found for this basename:  VITAMINB12, FOLATE, FERRITIN, TIBC, IRON, RETICCTPCT,  in the last 72 hours  Micro Results: No results found for this or any previous visit (from the past 240 hour(s)).   Studies/Results: Mr Brain Wo Contrast  06/01/2013   CLINICAL DATA:  Stroke  EXAM: MRI HEAD WITHOUT CONTRAST  TECHNIQUE: Multiplanar, multiecho pulse sequences of the brain and surrounding structures were obtained without intravenous contrast.  COMPARISON:  None.  FINDINGS: Negative for acute infarct. Small 3 mm hyperintensities in the frontal white matter bilaterally. Brainstem and cerebellum are intact. Negative for intracranial hemorrhage.  Ventricle size is normal. Cerebral volume is normal. Negative for mass or edema.  Mild mucosal thickening in the paranasal sinuses.  IMPRESSION: No acute abnormality.  Normal for age.   Electronically Signed   By: Franchot Gallo M.D.   On: 06/01/2013 23:50     Medications: Scheduled: . cholecalciferol  1,000 Units Oral Daily  . insulin aspart  0-9 Units Subcutaneous TID WC  . losartan  50 mg Oral BID  . montelukast  10 mg Oral QHS  . vitamin B-12  1,000 mcg Oral Daily  . vitamin C  500 mg Oral Daily   Continuous: .  sodium chloride 60 mL/hr (06/01/13 2324)     Assessment/Plan: Active Problems:   DM   Orthostasis   Ataxia   N/DeH/Orthostasis/Dizzy/Fatigue/Anorexia and ? Cerebellar Ataxia - MRI (-) Daughter states her mom has not felt well since 2/13th. Also exposed to family viruses and URI's Worked c PT this am and Pt generally unsteady with mobility and with sudden onset of dizziness after amb to bathroom and washing hands.  With Lat gaze issues - ? All related to Vertigo. Start Meclizine and continue Hydrating c holding some meds (flexeril, HCT, Metformin). Await cortisol and TSh May need Neuro consult. OOB c assist only  HTN - Hold Diuretic and eval and adjust in am.  DVT Proph - SCDs. May add Lovenox since MRI (-) DM2 -SSI - light. Hold metformin  Recent Labs   06/01/13 2255 06/02/13 0735  GLUCAP 138* 118*   Hopefully improves some today and overnight and can go home tomorrow/Monday.   LOS: 1 day   Merrick Feutz M 06/02/2013, 10:46 AM

## 2013-06-02 NOTE — Evaluation (Signed)
Physical Therapy Evaluation Patient Details Name: Kayla Medina MRN: [id number] DOB: 24-Jun-1932 Today's Date: 06/02/2013 Time: 9767-3419 PT Time Calculation (min): 27 min  PT Assessment / Plan / Recommendation History of Present Illness  pt presents with balance deficits and dizziness.    Clinical Impression  Pt generally unsteady with mobility and with sudden onset of dizziness after amb to bathroom and washing hands.  Pt needed MinA to sit quickly on bed with pt indicating symptoms resolved.  Spoke with pt about if this has happened before and pt relates that often first thing in the morning she has an episode of "vertigo" and has to lie back down and takes a Dramamine.  Spoke with RN about pt c/o "vertigo" and RN to check Orthostatics.  Will continue to follow.      PT Assessment  Patient needs continued PT services    Follow Up Recommendations  Home health PT;Supervision for mobility/OOB    Does the patient have the potential to tolerate intense rehabilitation      Barriers to Discharge        Equipment Recommendations   (TBD)    Recommendations for Other Services     Frequency Min 4X/week    Precautions / Restrictions Precautions Precautions: Fall Restrictions Weight Bearing Restrictions: No   Pertinent Vitals/Pain Denied pain.        Mobility  Bed Mobility Overal bed mobility: Modified Independent Transfers Overall transfer level: Needs assistance Equipment used: None Transfers: Sit to/from Stand Sit to Stand: Supervision;Min assist General transfer comment: pt S to come to stand, but required MinA to return to sitting 2/2 sudden onset of dizziness.   Ambulation/Gait Ambulation/Gait assistance: Min guard Ambulation Distance (Feet): 15 Feet (x2) Assistive device:  (IV pole) Gait Pattern/deviations: Step-through pattern;Decreased stride length;Staggering left;Staggering right Gait velocity interpretation: Below normal speed for age/gender General Gait  Details: pt unsteady and with staggered gait.  pt uses IV pole for support.  As pt amb back to bed, pt became very dissy and needed MinA to return to bed.   Modified Rankin (Stroke Patients Only) Pre-Morbid Rankin Score: No symptoms Modified Rankin: Moderately severe disability    Exercises     PT Diagnosis: Difficulty walking  PT Problem List: Decreased activity tolerance;Decreased balance;Decreased mobility;Decreased coordination;Decreased knowledge of use of DME PT Treatment Interventions: DME instruction;Gait training;Stair training;Functional mobility training;Therapeutic activities;Therapeutic exercise;Balance training;Neuromuscular re-education;Patient/family education     PT Goals(Current goals can be found in the care plan section) Acute Rehab PT Goals Patient Stated Goal: Back to normal PT Goal Formulation: With patient Time For Goal Achievement: 06/16/13 Potential to Achieve Goals: Good  Visit Information  Last PT Received On: 06/02/13 Assistance Needed: +1 History of Present Illness: pt presents with balance deficits and dizziness.         Prior [place] expects to be discharged to:: Private residence Living Arrangements: Alone Available Help at Discharge: Family;Available PRN/intermittently Type of Home: House Home Access: Stairs to enter [employer] of Steps: 8 Entrance Stairs-Rails: Right Home Layout: One level Home Equipment: None Additional Comments: Daughter works [employer] and can check on pt daily.   Prior Function Level of Independence: Independent Comments: pt works [employer].   Communication Communication: No difficulties    Cognition  Cognition Arousal/Alertness: Awake/alert Behavior During Therapy: WFL for tasks assessed/performed Overall Cognitive Status: Within Functional Limits for tasks assessed    Extremity/Trunk Assessment Upper Extremity Assessment Upper Extremity Assessment:  Defer to OT evaluation Lower  Extremity Assessment Lower Extremity Assessment: Overall WFL for tasks assessed Cervical / Trunk Assessment Cervical / Trunk Assessment: Normal   Balance Balance Overall balance assessment: Needs assistance Standing balance support: Single extremity supported Standing balance-Leahy Scale: Poor  End of Session PT - End of Session Equipment Utilized During Treatment: Gait belt Activity Tolerance: Patient tolerated treatment well Patient left: in bed;with call bell/phone within reach Nurse Communication: Mobility status  GP     Kayla Medina, [name] 06/02/2013, 9:58 AM

## 2013-06-03 LAB — GLUCOSE, CAPILLARY: Glucose-Capillary: 144 mg/dL — ABNORMAL HIGH (ref 70–99)

## 2013-06-03 LAB — CORTISOL-AM, BLOOD: Cortisol - AM: 7.4 ug/dL (ref 4.3–22.4)

## 2013-06-03 MED ORDER — GUAIFENESIN ER 600 MG PO TB12: 600.0000 mg | ORAL_TABLET | Freq: Two times a day (BID) | ORAL | Status: DC | PRN

## 2013-06-03 MED ORDER — MECLIZINE HCL 25 MG PO TABS: 25.0000 mg | ORAL_TABLET | Freq: Three times a day (TID) | ORAL | Status: AC | PRN

## 2013-06-03 NOTE — Discharge Summary (Signed)
Physician Discharge Summary  DISCHARGE SUMMARY   Patient ID: SWEETIE GIEBLER MR#: [id number] DOB/AGE: 1932-06-16 80 y.o.   Attending Physician:Onyx Edgley M  Patient's EGB:TDVV, Gwyndolyn Saxon, MD  Consults:  none  Admit date: 06/01/2013 Discharge date: 06/03/2013  Discharge Diagnoses:  Active Problems:   DM   Orthostasis   Ataxia   Patient Active Problem List   Diagnosis Date Noted  . Orthostasis 06/01/2013  . Ataxia 06/01/2013  . DM 04/17/2009  . HYPERLIPIDEMIA-MIXED 04/17/2009  . CHEST PAIN-UNSPECIFIED 04/17/2009  . PALPITATIONS 04/14/2009  . DYSPNEA 04/14/2009   Past Medical History  Diagnosis Date  . Chest pain, unspecified   . Other and unspecified hyperlipidemia   . Shortness of breath   . Palpitations   . Shingles     hx  . Subclinical hypothyroidism   . Diabetes mellitus without complication   . Hypertension     Discharged Condition: stable/better   Discharge Medications:   Medication List    STOP taking these medications       cyclobenzaprine 10 MG tablet  Commonly known as:  FLEXERIL     hydrochlorothiazide 12.5 MG capsule  Commonly known as:  MICROZIDE      TAKE these medications       ALPRAZolam 0.5 MG tablet  Commonly known as:  XANAX  Take 0.5 mg by mouth at bedtime as needed for anxiety.     cholecalciferol 1000 UNITS tablet  Commonly known as:  VITAMIN D  Take 1,000 Units by mouth daily.     Co Q-10 30 MG Caps  Take 30 mg by mouth daily.     cycloSPORINE 0.05 % ophthalmic emulsion  Commonly known as:  RESTASIS  Place 1 drop into both eyes 2 (two) times daily as needed (for dry eyes).     desonide 0.05 % cream  Commonly known as:  DESOWEN  Apply 1 application topically 2 (two) times daily as needed (for itching/dry skin).     GLUCOPHAGE 1000 MG tablet  Generic drug:  metFORMIN  Take 1,000 mg by mouth 2 (two) times daily.     guaiFENesin 600 MG 12 hr tablet  Commonly known as:  MUCINEX  Take 1 tablet (600 mg total) by mouth  2 (two) times daily as needed for cough or to loosen phlegm.     losartan 50 MG tablet  Commonly known as:  COZAAR  Take 50 mg by mouth 2 (two) times daily.     meclizine 25 MG tablet  Commonly known as:  ANTIVERT  Take 1 tablet (25 mg total) by mouth 3 (three) times daily as needed for dizziness.     SINGULAIR 10 MG tablet  Generic drug:  montelukast  Take 10 mg by mouth at bedtime.     vitamin B-12 1000 MCG tablet  Commonly known as:  CYANOCOBALAMIN  Take 1,000 mcg by mouth daily.     vitamin C 500 MG tablet  Commonly known as:  ASCORBIC ACID  Take 500 mg by mouth daily.     vitamin E 600 UNIT capsule  Take 400 Units by mouth daily.        Hospital Procedures: Mr Brain Wo Contrast  06/01/2013   CLINICAL DATA:  Stroke  EXAM: MRI HEAD WITHOUT CONTRAST  TECHNIQUE: Multiplanar, multiecho pulse sequences of the brain and surrounding structures were obtained without intravenous contrast.  COMPARISON:  None.  FINDINGS: Negative for acute infarct. Small 3 mm hyperintensities in the frontal white matter bilaterally. Brainstem and cerebellum are  intact. Negative for intracranial hemorrhage.  Ventricle size is normal. Cerebral volume is normal. Negative for mass or edema.  Mild mucosal thickening in the paranasal sinuses.  IMPRESSION: No acute abnormality.  Normal for age.   Electronically Signed   By: Franchot Gallo M.D.   On: 06/01/2013 23:50    History of Present Illness:  13 F c DM2 and HTN presented to UC then the ED on 06/01/13 c 1 week of strange Sxs. 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 He Rxed her c Flexeril and it helped. Sxs changed Late Tuesday c increasing Fatigue. Worked Tuesday and Thursday 15 hrs plus. In ED she c/o of Lightheadedness c N/DeH/Orthostasis/Dizzy/Fatigue/Anorexia. She had only been able to lie around. I was called to admit for eval and Rx. C/O intermittent Frontal HA. 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 When stood up she got very dizzy, nauseated, and lightheaded. Orthostasis noted. Generalized weakness. Nausea is worse when she stands. Lightheadedness was worse when she stood. She denied pain anywhere denied fever. She was seen in urgent care Center and sent to the ED for further evaluation. 2 Zofran, without relief. No other associated symptoms  I was called for inpt admission because she could not funnction   Hospital Course:  Admitted 06/01/13 - N/DeH/Orthostasis/Dizzy/Fatigue/Anorexia and ? Cerebellar Ataxia  MRI done to R/o post circulation CVA or issues - it was (-) She was hydrated. Meds adjusted. HCT, Flexeril, and Metformin held.  Daughter states her mom has not felt well since 2/13th.  She admits to 1 week.  Worked c PT 3/14 and Pt generally unsteady with mobility and with sudden onset of dizziness after amb to bathroom and washing hands.   On 3/14 she had Lateral gaze issues and the question became are all her Sxs related to Atypical Vertigo.  We started Meclizine and on morning of 3/15 she was smiling, joking, able to stand, walk - No more dizzieness.  She was better. TSH = 9.176 (H).  Will add Free T4 and TT3 - she will likely need Synthroid added. Labs need to be followed up on. Cortisol - AM 7.4 - this is equivecal and b/c she is better we will hold on further testing unless Sxs recur. She did not need a Neuro consult.    HTN - Hold Diuretic and eval and adjust BP as outpt. He BP was checked in Standing 141/68 HR 62:  Sitting 143/68 HR 62: Standing 126/63 HR 50.  This is not a + tilt as the HR went down  DVT Proph - SCDs/Lovenox  DM2 c neuropathy -SSI in hospital. Metformin held in hospital.  OK to restart.  She could not tolerate the Januvia.  Will d/c on 3/15 s HHPT as she is back to Normal.  Day of Discharge Exam BP 126/63  Pulse 50  Temp(Src) 98.1 F (36.7 C) (Oral)  Resp 18  Ht 5\' 6"  (1.676  m)  Wt 75.524 kg (166 lb 8 oz)  BMI 26.89 kg/m2  SpO2 99%  Physical Exam:  General appearance: Looks better - she is in better mood.  Eyes: L Lat Gaze is back to norma. Throat: oropharynx moist without erythema  Resp: CTA  Cardio: reg  GI: soft, non-tender; bowel sounds normal; no masses, no organomegaly  Extremities: no clubbing, cyanosis or edema  NON-Dizzy c standing  Discharge  Labs:  Recent Labs  06/01/13 1823 06/02/13 0558  NA 142 144  K 3.8 3.9  CL 102 108  CO2 24 23  GLUCOSE 134* 149*  BUN 19 18  CREATININE 0.82 0.87  CALCIUM 9.8 8.9    Recent Labs  06/01/13 1823 06/02/13 0558  AST 19 14  ALT 17 13  ALKPHOS 65 53  BILITOT <0.2* 0.2*  PROT 7.0 5.8*  ALBUMIN 3.7 3.1*    Recent Labs  06/01/13 1823 06/02/13 0558  WBC 10.2 7.8  NEUTROABS 4.6  --   HGB 13.4 11.5*  HCT 38.9 34.3*  MCV 89.6 90.7  PLT 322 285   No results found for this basename: CKTOTAL, CKMB, CKMBINDEX, TROPONINI,  in the last 72 hours  Recent Labs  06/02/13 0558  TSH 9.176*   No results found for this basename: VITAMINB12, FOLATE, FERRITIN, TIBC, IRON, RETICCTPCT,  in the last 72 hours No results found for this basename: INR,  PROTIME       Discharge instructions:   Follow-up Information   Follow up with Marton Redwood, MD In 1 week.   Specialty:  Internal Medicine   Contact information:   [place] [address] [phone]        Disposition: home  Follow-up Appts: Follow-up with Dr. Brigitte Pulse at [facility] in 1-2 weeks.  Call for appointment.  Condition on Discharge: stable/Better  Tests Needing Follow-up: Free T4 and Total T3. BP. Orthostatics.  Time spent in discharge (includes decision making & examination of pt): 35 min.  Signed: Precious Reel 06/03/2013, 9:17 AM

## 2013-06-11 ENCOUNTER — Ambulatory Visit (INDEPENDENT_AMBULATORY_CARE_PROVIDER_SITE_OTHER): Payer: Medicare Other | Admitting: Gastroenterology

## 2013-06-11 ENCOUNTER — Encounter: Payer: Self-pay | Admitting: Gastroenterology

## 2013-06-11 VITALS — BP 154/70 | HR 64 | Ht 66.0 in | Wt 162.4 lb

## 2013-06-11 DIAGNOSIS — R197 Diarrhea, unspecified: Secondary | ICD-10-CM

## 2013-06-11 DIAGNOSIS — R1319 Other dysphagia: Secondary | ICD-10-CM

## 2013-06-11 DIAGNOSIS — R109 Unspecified abdominal pain: Secondary | ICD-10-CM

## 2013-06-11 DIAGNOSIS — R195 Other fecal abnormalities: Secondary | ICD-10-CM

## 2013-06-11 MED ORDER — PEG-KCL-NACL-NASULF-NA ASC-C 100 G PO SOLR: 1.0000 | Freq: Once | ORAL | Status: DC

## 2013-06-11 NOTE — Progress Notes (Signed)
    History of Present Illness: This is a 80 year old female accompanied by her daughter. The pt was hospitalized 10 days ago with dizziness, fatigue, headaches and weakness. She was diagnosed with vertigo and placed on meclizine with resolution of her dizziness. In addition she complains of upper abdominal pain, dysphagia and chronic diarrhea. She states she had rectal surgery performed for a fissure and abscess in the 1960s and has had loose stools 3-5 times a day since that time. She had the onset of fatigue, headaches, upper abdominal pain and dysphagia about 3-4 months ago. She notes dysphagia with grits. She was recently started on omeprazole with no change in symptoms. Hemosure + stool was noted. She has not previously had colonoscopy or EGD. Denies weight loss, constipation, change in stool caliber, melena, hematochezia, nausea, vomiting, reflux symptoms, chest pain.  Review of Systems: Pertinent positive and negative review of systems were noted in the above HPI section. All other review of systems were otherwise negative.  Current Medications, Allergies, Past Medical History, Past Surgical History, Family History and Social History were reviewed in [employer] record.  Physical Exam: General: Well developed , well nourished, no acute distress Head: Normocephalic and atraumatic Eyes:  sclerae anicteric, EOMI Ears: Normal auditory acuity Mouth: No deformity or lesions Neck: Supple, no masses or thyromegaly Lungs: Clear throughout to auscultation Heart: Regular rate and rhythm; no murmurs, rubs or bruits Abdomen: Soft, moderate upper abdominal tenderness to deep palpation without rebound or guarding and non distended. No masses, hepatosplenomegaly or hernias noted. Normal Bowel sounds Rectal: Deferred to colonoscopy Musculoskeletal: Symmetrical with no gross deformities  Skin: No lesions on visible extremities Pulses:  Normal pulses noted Extremities: No clubbing,  cyanosis, edema or deformities noted Neurological: Alert oriented x 4, grossly nonfocal Cervical Nodes:  No significant cervical adenopathy Inguinal Nodes: No significant inguinal adenopathy Psychological:  Alert and cooperative. Normal mood and affect  Assessment and Recommendations:  1. Dysphagia, upper abdominal pain, Hemoccult positive stool, chronic diarrhea. R/O ulcer, esophagitis, esophageal stricture, neoplasm continue omeprazole 20 mg daily. Schedule colonoscopy and EGD. The risks, benefits, and alternatives to colonoscopy with possible biopsy and possible polypectomy were discussed with the patient and they consent to proceed. The risks, benefits, and alternatives to endoscopy with possible biopsy and possible dilation were discussed with the patient and they consent to proceed.   Marland Kitchen

## 2013-06-11 NOTE — Patient Instructions (Signed)
You have been scheduled for an endoscopy and colonoscopy with propofol. Please follow the written instructions given to you at your visit today. Please pick up your prep at the pharmacy within the next 1-3 days. If you use inhalers (even only as needed), please bring them with you on the day of your procedure.  Thank you for choosing me and [facility].  Pricilla Riffle. Dagoberto Ligas., MD., [facility]al  cc: Marton Redwood, MD

## 2013-06-14 ENCOUNTER — Encounter: Payer: Self-pay | Admitting: Gastroenterology

## 2013-06-14 ENCOUNTER — Ambulatory Visit (AMBULATORY_SURGERY_CENTER): Payer: Medicare Other | Admitting: Gastroenterology

## 2013-06-14 VITALS — BP 154/75 | HR 62 | Temp 97.3°F | Resp 15 | Ht 66.0 in | Wt 162.0 lb

## 2013-06-14 DIAGNOSIS — R1013 Epigastric pain: Secondary | ICD-10-CM

## 2013-06-14 DIAGNOSIS — R195 Other fecal abnormalities: Secondary | ICD-10-CM

## 2013-06-14 DIAGNOSIS — R1310 Dysphagia, unspecified: Secondary | ICD-10-CM

## 2013-06-14 LAB — GLUCOSE, CAPILLARY
GLUCOSE-CAPILLARY: 146 mg/dL — AB (ref 70–99)
GLUCOSE-CAPILLARY: 129 mg/dL — AB (ref 70–99)

## 2013-06-14 MED ORDER — FLUCONAZOLE 100 MG PO TABS: ORAL_TABLET | ORAL | Status: DC

## 2013-06-14 MED ORDER — SODIUM CHLORIDE 0.9 % IV SOLN: 500.0000 mL | INTRAVENOUS | Status: DC

## 2013-06-14 NOTE — Op Note (Signed)
[place]  [address] [place], [id number]   ENDOSCOPY PROCEDURE REPORT  PATIENT: Kayla Medina, Kayla Medina  MR#: [id number] BIRTHDATE: June 19, 1932 , 72  yrs. old GENDER: Female ENDOSCOPIST: Ladene Artist, MD, [facility] REFERRED BY:  Janalyn Rouse, M.D. PROCEDURE DATE:  06/14/2013 PROCEDURE:  EGD, diagnostic and Savary dilation of esophagus ASA CLASS:     Class III INDICATIONS:  Dysphagia.   Epigastric pain.   Occult blood positive.  MEDICATIONS: MAC sedation, administered by CRNA and propofol (Diprivan) 200mg  IV TOPICAL ANESTHETIC: none DESCRIPTION OF PROCEDURE: After the risks benefits and alternatives of the procedure were thoroughly explained, informed consent was obtained.  The LB [id number] endoscope was introduced through the mouth and advanced to the second portion of the duodenum. Without limitations.  The instrument was slowly withdrawn as the mucosa was fully examined.  ESOPHAGUS: White exudates consistent with candidiasis were found in the upper third of the esophagus and middle third of the esophagus. A stricture was found at the gastroesophageal junction, 13 mm.  The stenosis was traversable with the endoscope.   The esophagus was otherwise normal. STOMACH: The mucosa and folds of the stomach appeared normal. Retroflexed view photo did not capture. DUODENUM: The duodenal mucosa showed no abnormalities in the bulb and second portion of the duodenum.  Retroflexed views revealed a small hiatal hernia.  A guidewire was placed and the scope was withdrawn from the patient.  14 and 15 mm Savary dilators were passed with no resistance and no heme noted and the procedure completed.  COMPLICATIONS: Hypoxemia occurred ENDOSCOPIC IMPRESSION: 1.   White exudates consistent with candidiasis 2.   Stricture at the gastroesophageal junction 3.   Small hiatal hernia  RECOMMENDATIONS: 1.  Anti-reflux regimen long term 2.  Continue PPI daily long term 3.   Diflucan 100 mg PO daily for 7 days 4.  Post dilation instructions   eSigned:  Ladene Artist, MD, [facility] 06/14/2013 11:24 AM

## 2013-06-14 NOTE — Progress Notes (Signed)
Pt desats during endoscopic exam. Laryngospasm detected. 100% O2 by BVM and PEEP. sats returned to baseline. Procedure completed without difficulty.   Procedure ends. To recovery, report given and VSS.

## 2013-06-14 NOTE — Patient Instructions (Signed)
Discharge instructions given with verbal understanding. Handout on a dilatation diet. Prescription sent in to pharmacy. Resume previous medications. YOU HAD AN ENDOSCOPIC PROCEDURE TODAY AT [facility]: Refer to the procedure report that was given to you for any specific questions about what was found during the examination.  If the procedure report does not answer your questions, please call your gastroenterologist to clarify.  If you requested that your care partner not be given the details of your procedure findings, then the procedure report has been included in a sealed envelope for you to review at your convenience later.  YOU SHOULD EXPECT: Some feelings of bloating in the abdomen. Passage of more gas than usual.  Walking can help get rid of the air that was put into your GI tract during the procedure and reduce the bloating. If you had a lower endoscopy (such as a colonoscopy or flexible sigmoidoscopy) you may notice spotting of blood in your stool or on the toilet paper. If you underwent a bowel prep for your procedure, then you may not have a normal bowel movement for a few days.  DIET: Your first meal following the procedure should be a light meal and then it is ok to progress to your normal diet.  A half-sandwich or bowl of soup is an example of a good first meal.  Heavy or fried foods are harder to digest and may make you feel nauseous or bloated.  Likewise meals heavy in dairy and vegetables can cause extra gas to form and this can also increase the bloating.  Drink plenty of fluids but you should avoid alcoholic beverages for 24 hours.  ACTIVITY: Your care partner should take you home directly after the procedure.  You should plan to take it easy, moving slowly for the rest of the day.  You can resume normal activity the day after the procedure however you should NOT DRIVE or use heavy machinery for 24 hours (because of the sedation medicines used during the test).     SYMPTOMS TO REPORT IMMEDIATELY: A gastroenterologist can be reached at any hour.  During normal business hours, 8:30 AM to 5:00 PM Monday through Friday, call [phone].  After hours and on weekends, please call the GI answering service at [phone] who will take a message and have the physician on call contact you.   Following upper endoscopy (EGD)  Vomiting of blood or coffee ground material  New chest pain or pain under the shoulder blades  Painful or persistently difficult swallowing  New shortness of breath  Fever of 100F or higher  Black, tarry-looking stools  FOLLOW UP: If any biopsies were taken you will be contacted by phone or by letter within the next 1-3 weeks.  Call your gastroenterologist if you have not heard about the biopsies in 3 weeks.  Our staff will call the home number listed on your records the next business day following your procedure to check on you and address any questions or concerns that you may have at that time regarding the information given to you following your procedure. This is a courtesy call and so if there is no answer at the home number and we have not heard from you through the emergency physician on call, we will assume that you have returned to your regular daily activities without incident.  SIGNATURES/CONFIDENTIALITY: You and/or your care partner have signed paperwork which will be entered into your electronic medical record.  These signatures attest to the fact  that that the information above on your After Visit Summary has been reviewed and is understood.  Full responsibility of the confidentiality of this discharge information lies with you and/or your care-partner.

## 2013-06-14 NOTE — Progress Notes (Signed)
Called to room to assist during endoscopic procedure.  Patient ID and intended procedure confirmed with present staff. Received instructions for my participation in the procedure from the performing physician.  

## 2013-06-15 ENCOUNTER — Telehealth: Payer: Self-pay | Admitting: *Deleted

## 2013-06-15 NOTE — Telephone Encounter (Signed)
[facility] Name Identifier. Phone [phone].  Instructed patient to call back if any problems or concerns.

## 2013-07-20 ENCOUNTER — Encounter: Payer: Self-pay | Admitting: Gastroenterology

## 2013-07-20 ENCOUNTER — Ambulatory Visit (AMBULATORY_SURGERY_CENTER): Payer: Medicare Other | Admitting: Gastroenterology

## 2013-07-20 VITALS — BP 147/74 | HR 65 | Temp 96.9°F | Resp 12 | Ht 66.0 in | Wt 162.0 lb

## 2013-07-20 DIAGNOSIS — D126 Benign neoplasm of colon, unspecified: Secondary | ICD-10-CM

## 2013-07-20 DIAGNOSIS — R195 Other fecal abnormalities: Secondary | ICD-10-CM

## 2013-07-20 LAB — GLUCOSE, CAPILLARY
Glucose-Capillary: 144 mg/dL — ABNORMAL HIGH (ref 70–99)
Glucose-Capillary: 125 mg/dL — ABNORMAL HIGH (ref 70–99)

## 2013-07-20 MED ORDER — SODIUM CHLORIDE 0.9 % IV SOLN: 500.0000 mL | INTRAVENOUS | Status: DC

## 2013-07-20 NOTE — Progress Notes (Signed)
Procedure ends, to recovery, report given and VSS. 

## 2013-07-20 NOTE — Op Note (Addendum)
[facility]  [address] [place], [id number]   COLONOSCOPY PROCEDURE REPORT PATIENT: Kayla Medina, Kayla Medina  MR#: [id number] BIRTHDATE: 01-26-1933 , 81  yrs. old GENDER: Female ENDOSCOPIST: Ladene Artist, MD, [facility] REFERRED BY:W.  Lutricia Feil, M.D. PROCEDURE DATE:  07/20/2013 PROCEDURE:   Colonoscopy with biopsy, snare polypectomy and hot biopsy/bipolar First Screening Colonoscopy - Avg.  risk and is 50 yrs.  old or older - No.  Prior Negative Screening - Now for repeat screening. N/A  History of Adenoma - Now for follow-up colonoscopy & has been > or = to 3 yrs.  N/A  Polyps Removed Today? Yes. ASA CLASS:   Class II INDICATIONS:heme-positive stool. MEDICATIONS: MAC sedation, administered by CRNA and propofol (Diprivan) 370mg  IV DESCRIPTION OF PROCEDURE:   After the risks benefits and alternatives of the procedure were thoroughly explained, informed consent was obtained.  A digital rectal exam revealed no abnormalities of the rectum.   The LB [id number]  endoscope was introduced through the anus and advanced to the cecum, which was identified by both the appendix and ileocecal valve. No adverse events experienced with a tortuous and redundant colon.   The quality of the prep was excellent, using MoviPrep  The instrument was then slowly withdrawn as the colon was fully examined.  COLON FINDINGS: Two polyps measuring 8-10 mm in size were found in the ascending colon and at the hepatic flexure.  A polypectomy was performed using snare cautery.  The resection was complete and the polyp tissue was completely retrieved.   Two sessile polyps measuring 4 mm in size were found at the hepatic flexure.  A polypectomy was performed with cold forceps.  The resection was complete and the polyp tissue was completely retrieved.   A sessile polyp measuring 8 mm in size was found in the transverse colon.  A polypectomy was performed using snare cautery.  The resection  was complete and the polyp tissue was completely retrieved.   A sessile polyp measuring 5 mm in size was found in the sigmoid colon.  A polypectomy was performed using hot forceps.  The resection was complete and the polyp tissue was completely retrieved.   The colon was otherwise normal.  There was no diverticulosis, inflammation, polyps or cancers unless previously stated.  Retroflexed views revealed internal hemorrhoids. The time to cecum=5 minutes 30 seconds.  Withdrawal time=21 minutes 30 seconds.  The scope was withdrawn and the procedure completed. COMPLICATIONS: There were no complications. ENDOSCOPIC IMPRESSION: 1.   Two polyps measuring 8-10 mm: ascending colon and hepatic flexure; polypectomy performed using snare cautery 2.   Two sessile polyps measuring 4 mm at the hepatic flexure; polypectomy performed with cold forceps 3.   Sessile polyp measuring 8 mm in the transverse colon; polypectomy performed using snare cautery 4.   Sessile polyp measuring 5 mm in the sigmoid colon; polypectomy performed using hot forceps 5.   Small internal hemorrhoids RECOMMENDATIONS: 1.  Await pathology results 2.  Hold aspirin, aspirin products, and anti-inflammatory medication for 2 weeks. 3.  Given your age, you will not need another colonoscopy for colon cancer screening or polyp surveillance.  These types of tests usually stop around the age 66. eSigned:  Ladene Artist, MD, [facility] 07/20/2013 2:51 PM     PATIENT NAME:  Kayla Medina MR#: [id number]

## 2013-07-20 NOTE — Patient Instructions (Signed)
No aspirin,aspirin products or NSAIDS(motrin,ibuprofen, aleve etc) for 2 weeks, 08/03/13   YOU HAD AN ENDOSCOPIC PROCEDURE TODAY AT [facility]: Refer to the procedure report that was given to you for any specific questions about what was found during the examination.  If the procedure report does not answer your questions, please call your gastroenterologist to clarify.  If you requested that your care partner not be given the details of your procedure findings, then the procedure report has been included in a sealed envelope for you to review at your convenience later.  YOU SHOULD EXPECT: Some feelings of bloating in the abdomen. Passage of more gas than usual.  Walking can help get rid of the air that was put into your GI tract during the procedure and reduce the bloating. If you had a lower endoscopy (such as a colonoscopy or flexible sigmoidoscopy) you may notice spotting of blood in your stool or on the toilet paper. If you underwent a bowel prep for your procedure, then you may not have a normal bowel movement for a few days.  DIET: Your first meal following the procedure should be a light meal and then it is ok to progress to your normal diet.  A half-sandwich or bowl of soup is an example of a good first meal.  Heavy or fried foods are harder to digest and may make you feel nauseous or bloated.  Likewise meals heavy in dairy and vegetables can cause extra gas to form and this can also increase the bloating.  Drink plenty of fluids but you should avoid alcoholic beverages for 24 hours.  ACTIVITY: Your care partner should take you home directly after the procedure.  You should plan to take it easy, moving slowly for the rest of the day.  You can resume normal activity the day after the procedure however you should NOT DRIVE or use heavy machinery for 24 hours (because of the sedation medicines used during the test).    SYMPTOMS TO REPORT IMMEDIATELY: A gastroenterologist can be  reached at any hour.  During normal business hours, 8:30 AM to 5:00 PM Monday through Friday, call [phone].  After hours and on weekends, please call the GI answering service at [phone] who will take a message and have the physician on call contact you.   Following lower endoscopy (colonoscopy or flexible sigmoidoscopy):  Excessive amounts of blood in the stool  Significant tenderness or worsening of abdominal pains  Swelling of the abdomen that is new, acute  Fever of 100F or higher  FOLLOW UP: If any biopsies were taken you will be contacted by phone or by letter within the next 1-3 weeks.  Call your gastroenterologist if you have not heard about the biopsies in 3 weeks.  Our staff will call the home number listed on your records the next business day following your procedure to check on you and address any questions or concerns that you may have at that time regarding the information given to you following your procedure. This is a courtesy call and so if there is no answer at the home number and we have not heard from you through the emergency physician on call, we will assume that you have returned to your regular daily activities without incident.  SIGNATURES/CONFIDENTIALITY: You and/or your care partner have signed paperwork which will be entered into your electronic medical record.  These signatures attest to the fact that that the information above on your After Visit Summary has  been reviewed and is understood.  Full responsibility of the confidentiality of this discharge information lies with you and/or your care-partner.

## 2013-07-20 NOTE — Progress Notes (Signed)
Called to room to assist during endoscopic procedure.  Patient ID and intended procedure confirmed with present staff. Received instructions for my participation in the procedure from the performing physician.  

## 2013-07-23 ENCOUNTER — Telehealth: Payer: Self-pay | Admitting: *Deleted

## 2013-07-23 NOTE — Telephone Encounter (Signed)
Message left at [phone]. Attempted to call [phone] no message left.

## 2013-07-24 ENCOUNTER — Encounter: Payer: Self-pay | Admitting: Gastroenterology

## 2013-08-03 NOTE — Progress Notes (Addendum)
Physical Therapy Note  Late entry for missed G-code.     06/02/13 0905  PT Visit Information  Last PT Received On 08/12/13  PT G-Codes **NOT FOR INPATIENT CLASS**  Functional Assessment Tool Used Clinical Judgement  Functional Limitation Mobility: Walking and moving around  Mobility: Walking and Moving Around Current Status [phone]) CI  Mobility: Walking and Moving Around Goal Status [phone]) Tupman, Lindsay

## 2014-03-26 ENCOUNTER — Encounter: Payer: Self-pay | Admitting: Cardiovascular Disease

## 2014-04-09 ENCOUNTER — Encounter: Payer: Self-pay | Admitting: Cardiovascular Disease

## 2014-04-22 ENCOUNTER — Encounter (HOSPITAL_COMMUNITY): Payer: Self-pay | Admitting: Neurology

## 2014-04-22 ENCOUNTER — Emergency Department (HOSPITAL_COMMUNITY): Payer: Medicare Other

## 2014-04-22 ENCOUNTER — Inpatient Hospital Stay (HOSPITAL_COMMUNITY)
Admission: EM | Admit: 2014-04-22 | Discharge: 2014-04-25 | DRG: 287 | Disposition: A | Discharge status: Home / Self Care | Payer: Medicare Other | Attending: Internal Medicine | Admitting: Internal Medicine

## 2014-04-22 DIAGNOSIS — I82B12 Acute embolism and thrombosis of left subclavian vein: Secondary | ICD-10-CM | POA: Diagnosis not present

## 2014-04-22 DIAGNOSIS — R079 Chest pain, unspecified: Secondary | ICD-10-CM | POA: Diagnosis present

## 2014-04-22 DIAGNOSIS — E785 Hyperlipidemia, unspecified: Secondary | ICD-10-CM | POA: Diagnosis present

## 2014-04-22 DIAGNOSIS — I8290 Acute embolism and thrombosis of unspecified vein: Secondary | ICD-10-CM

## 2014-04-22 DIAGNOSIS — Z7982 Long term (current) use of aspirin: Secondary | ICD-10-CM

## 2014-04-22 DIAGNOSIS — I447 Left bundle-branch block, unspecified: Secondary | ICD-10-CM | POA: Diagnosis present

## 2014-04-22 DIAGNOSIS — E119 Type 2 diabetes mellitus without complications: Secondary | ICD-10-CM | POA: Diagnosis present

## 2014-04-22 DIAGNOSIS — I5022 Chronic systolic (congestive) heart failure: Secondary | ICD-10-CM | POA: Diagnosis present

## 2014-04-22 DIAGNOSIS — R0600 Dyspnea, unspecified: Secondary | ICD-10-CM

## 2014-04-22 DIAGNOSIS — I10 Essential (primary) hypertension: Secondary | ICD-10-CM | POA: Diagnosis present

## 2014-04-22 LAB — CBC
HCT: 35.9 % — ABNORMAL LOW (ref 36.0–46.0)
HEMOGLOBIN: 12.1 g/dL (ref 12.0–15.0)
MCH: 29.9 pg (ref 26.0–34.0)
MCHC: 33.7 g/dL (ref 30.0–36.0)
MCV: 88.6 fL (ref 78.0–100.0)
Platelets: 362 10*3/uL (ref 150–400)
RBC: 4.05 MIL/uL (ref 3.87–5.11)
RDW: 13 % (ref 11.5–15.5)
WBC: 8.1 10*3/uL (ref 4.0–10.5)

## 2014-04-22 LAB — I-STAT TROPONIN, ED
TROPONIN I, POC: 0 ng/mL (ref 0.00–0.08)
Troponin i, poc: 0.01 ng/mL (ref 0.00–0.08)

## 2014-04-22 LAB — D-DIMER, QUANTITATIVE: D-Dimer, Quant: 0.7 ug/mL-FEU — ABNORMAL HIGH (ref 0.00–0.48)

## 2014-04-22 LAB — BASIC METABOLIC PANEL
Anion gap: 8 (ref 5–15)
BUN: 16 mg/dL (ref 6–23)
CHLORIDE: 108 mmol/L (ref 96–112)
CO2: 21 mmol/L (ref 19–32)
Calcium: 8.9 mg/dL (ref 8.4–10.5)
Creatinine, Ser: 1 mg/dL (ref 0.50–1.10)
GFR calc Af Amer: 60 mL/min — ABNORMAL LOW (ref >90)
GFR calc non Af Amer: 52 mL/min — ABNORMAL LOW (ref >90)
GLUCOSE: 286 mg/dL — AB (ref 70–99)
Potassium: 3.9 mmol/L (ref 3.5–5.1)
Sodium: 137 mmol/L (ref 135–145)

## 2014-04-22 LAB — CBG MONITORING, ED: Glucose-Capillary: 139 mg/dL — ABNORMAL HIGH (ref 70–99)

## 2014-04-22 MED ORDER — ACETAMINOPHEN 500 MG PO TABS
1000.0000 mg | ORAL_TABLET | Freq: Once | ORAL | Status: AC
  Administered 2014-04-22: 1000 mg via ORAL
  Filled 2014-04-22: qty 2

## 2014-04-22 MED ORDER — IOHEXOL 300 MG/ML  SOLN
80.0000 mL | Freq: Once | INTRAMUSCULAR | Status: AC | PRN
  Administered 2014-04-22: 80 mL via INTRAVENOUS

## 2014-04-22 NOTE — ED Notes (Signed)
Pt reports middle cp tightness, pain to left arm. Reports recent cough. Denies cardiac hx. Feels SOB.

## 2014-04-22 NOTE — ED Provider Notes (Signed)
CSN: [id number]     Arrival date & time 04/22/14  1820 History   First MD Initiated Contact with Patient 04/22/14 1855     Chief Complaint  Patient presents with  . Chest Pain     (Consider location/radiation/quality/duration/timing/severity/associated sxs/prior Treatment) HPI 81 year old female with past medical history as below presents to ED for intermittent chest pain which is been ongoing for the past 2-3 days. She also reports having shortness of breath during this time which is also intermittent. Patient reports pain is mid sternum and radiated to her left shoulder. She had mild chest pain earlier in the day today but states she has had none since then. States she currently feels well. She denies having any associated nausea, vomiting, diaphoresis, leg pain, leg swelling. She does have a mild headache which is not uncommon for her. Of note, patient was recently admitted for flu. She states she says a lingering productive cough and fatigue since then. No fevers reported. No urinary symptoms. Past Medical History  Diagnosis Date  . Chest pain, unspecified   . Other and unspecified hyperlipidemia   . Shortness of breath   . Palpitations   . Shingles     hx  . Diabetes mellitus without complication   . Hypertension   . Rosacea   . Anal fissure   . Pneumonia   . PVC (premature ventricular contraction)   . Glaucoma     pt. states no-glaucoma 07/20/13  . Subclinical hypothyroidism    Past Surgical History  Procedure Laterality Date  . Bladder repair    . Bowel resection    . Bilateral eye surgery/cataract repair  3/09  . Total abdominal hysterectomy    . Appendectomy     Family History  Problem Relation Age of Onset  . Alzheimer's disease Mother   . Hypertension Mother   . Thyroid disease Mother   . Alcoholism Father   . Prostate cancer Brother   . Seizures Sister   . Diabetes Maternal Grandmother   . Diabetes Daughter     x 2  . Diabetes Paternal Grandmother     entire  family  . Thyroid disease Brother     x2   . Thyroid disease Daughter   . Thyroid disease Son   . Other Sister     meningitis   History  Substance Use Topics  . Smoking status: Never Smoker   . Smokeless tobacco: Never Used  . Alcohol Use: No   OB History    No data available     Review of Systems  Constitutional: Negative for fever and chills.  HENT: Negative for congestion, rhinorrhea and sore throat.   Eyes: Negative for visual disturbance.  Respiratory: Positive for cough, chest tightness and shortness of breath.   Cardiovascular: Positive for chest pain. Negative for palpitations and leg swelling.  Gastrointestinal: Negative for nausea, vomiting, abdominal pain, diarrhea and constipation.  Genitourinary: Negative for dysuria, hematuria, vaginal bleeding and vaginal discharge.  Musculoskeletal: Negative for back pain and neck pain.  Skin: Negative for rash.  Neurological: Negative for weakness and headaches.  All other systems reviewed and are negative.     Allergies  Codeine; Januvia; Other; Penicillins; and Sulfonamide derivatives  Home Medications   Prior to Admission medications   Medication Sig Start Date End Date Taking? Authorizing Provider  ALPRAZolam Duanne Moron) 0.5 MG tablet Take 0.5 mg by mouth at bedtime as needed for anxiety.    Historical Provider, MD  cholecalciferol (VITAMIN D) 1000 UNITS  tablet Take 1,000 Units by mouth daily.    Historical Provider, MD  Coenzyme Q10 (CO Q-10) 30 MG CAPS Take 30 mg by mouth daily.     Historical Provider, MD  cycloSPORINE (RESTASIS) 0.05 % ophthalmic emulsion Place 1 drop into both eyes 2 (two) times daily as needed (for dry eyes).    Historical Provider, MD  desonide (DESOWEN) 0.05 % cream Apply 1 application topically 2 (two) times daily as needed (for itching/dry skin).    Historical Provider, MD  fluconazole (DIFLUCAN) 100 MG tablet 1 tablet daily for 7 days 06/14/13   Ladene Artist, MD  guaiFENesin (MUCINEX) 600  MG 12 hr tablet Take 1 tablet (600 mg total) by mouth 2 (two) times daily as needed for cough or to loosen phlegm. 06/03/13   Precious Reel, MD  losartan (COZAAR) 100 MG tablet Take 100 mg by mouth 2 (two) times daily.  05/21/13   Historical Provider, MD  losartan (COZAAR) 50 MG tablet Take 25 mg by mouth 2 (two) times daily.     Historical Provider, MD  meclizine (ANTIVERT) 25 MG tablet Take 1 tablet (25 mg total) by mouth 3 (three) times daily as needed for dizziness. 06/03/13   Precious Reel, MD  metFORMIN (GLUCOPHAGE) 1000 MG tablet Take 1,000 mg by mouth 2 (two) times daily.     Historical Provider, MD  metroNIDAZOLE (METROCREAM) 0.75 % cream Apply 1 application topically as needed.  04/25/13   Historical Provider, MD  montelukast (SINGULAIR) 10 MG tablet Take 10 mg by mouth at bedtime.     Historical Provider, MD  omeprazole (PRILOSEC OTC) 20 MG tablet Take 20 mg by mouth daily.    Historical Provider, MD  vitamin B-12 (CYANOCOBALAMIN) 1000 MCG tablet Take 1,000 mcg by mouth daily.    Historical Provider, MD  vitamin C (ASCORBIC ACID) 500 MG tablet Take 500 mg by mouth daily.    Historical Provider, MD  vitamin E 600 UNIT capsule Take 400 Units by mouth daily.     Historical Provider, MD   BP 148/83 mmHg  Pulse 72  Temp(Src) 97.9 F (36.6 C) (Oral)  Resp 17  SpO2 97% Physical Exam  Constitutional: She is oriented to person, place, and time. She appears well-developed and well-nourished. No distress.  HENT:  Head: Normocephalic and atraumatic.  Eyes: Conjunctivae are normal.  Neck: Normal range of motion.  Cardiovascular: Normal rate, regular rhythm, normal heart sounds and intact distal pulses.   No murmur heard. Pulmonary/Chest: Effort normal and breath sounds normal. No respiratory distress. She has no wheezes. She has no rales. She exhibits no tenderness.  Abdominal: Soft. Bowel sounds are normal. She exhibits no distension and no mass. There is no tenderness. There is no rebound and no  guarding.  Musculoskeletal: Normal range of motion.  Neurological: She is alert and oriented to person, place, and time. No cranial nerve deficit.  Skin: Skin is warm and dry. She is not diaphoretic.  Psychiatric: She has a normal mood and affect.  Nursing note and vitals reviewed.   ED Course  Procedures (including critical care time) Labs Review Labs Reviewed  CBC - Abnormal; Notable for the following:    HCT 35.9 (*)    All other components within normal limits  BASIC METABOLIC PANEL - Abnormal; Notable for the following:    Glucose, Bld 286 (*)    GFR calc non Af Amer 52 (*)    GFR calc Af Amer 60 (*)  All other components within normal limits  D-DIMER, QUANTITATIVE - Abnormal; Notable for the following:    D-Dimer, Quant 0.70 (*)    All other components within normal limits  CBG MONITORING, ED - Abnormal; Notable for the following:    Glucose-Capillary 139 (*)    All other components within normal limits  I-STAT TROPOININ, ED  Randolm Idol, ED    Imaging Review Dg Chest 2 View  04/22/2014   CLINICAL DATA:  Chest pain and tightness. Left arm pain. Cough. Shortness of breath.  EXAM: CHEST  2 VIEW  COMPARISON:  None.  FINDINGS: Midline trachea. Normal heart size. Atherosclerosis in the transverse aorta. No pleural effusion or pneumothorax. Mild biapical pleural-parenchymal scarring. Greater on the left. Mild volume loss and probable scarring at the left lung base.  IMPRESSION: No acute cardiopulmonary disease.  Aortic atherosclerosis.   Electronically Signed   By: Abigail Miyamoto M.D.   On: 04/22/2014 19:59   Ct Angio Chest Pe W/cm &/or Wo Cm  04/22/2014   CLINICAL DATA:  Acute onset of sharp mid chest pain for several days and left arm pain. Recent cough. Initial encounter.  EXAM: CT ANGIOGRAPHY CHEST WITH CONTRAST  TECHNIQUE: Multidetector CT imaging of the chest was performed using the standard protocol during bolus administration of intravenous contrast. Multiplanar CT image  reconstructions and MIPs were obtained to evaluate the vascular anatomy.  CONTRAST:  80 mL of Omnipaque 350 IV contrast  COMPARISON:  Chest radiograph performed earlier today at 7:15 p.m.  FINDINGS: There is no evidence of pulmonary embolus.  Mild bilateral dependent subsegmental atelectasis is noted, with minimal associated nonspecific nodularity. Additional relatively diffuse minimal peripheral nodularity is noted within the upper lung lobes. This could reflect a mild atypical infectious process. There is no evidence of significant focal consolidation, pleural effusion or pneumothorax. No masses are identified; no abnormal focal contrast enhancement is seen.  Scattered mediastinal nodes remain normal in size, without evidence of mediastinal lymphadenopathy. No pericardial effusion is identified. Mild mural thrombus and calcification is noted at the origin of the left subclavian artery, with less than 50% luminal narrowing. The great vessels are otherwise grossly unremarkable. No axillary lymphadenopathy is seen. The thyroid gland is unremarkable in appearance.  Nonspecific vaguely decreased attenuation is noted within the liver near the hepatic hilum. The visualized portions of the spleen are unremarkable.  No acute osseous abnormalities are seen.  Review of the MIP images confirms the above findings.  IMPRESSION: 1. No evidence of pulmonary embolus. 2. Mild bilateral dependent subsegmental atelectasis, with minimal associated nonspecific nodularity. Additional relatively diffuse minimal peripheral nodularity within the upper lung lobes. This could reflect a mild atypical infectious process. 3. Mild mural thrombus and calcification at the origin of the left subclavian artery, with less than 50% luminal narrowing. 4. Nonspecific vaguely decreased attenuation within the liver near the hepatic hilum. This distribution is somewhat unusual for fatty infiltration, though incompletely visualized. Would correlate with  LFTs and consider dynamic liver protocol MRI or CT for further evaluation.   Electronically Signed   By: Garald Balding M.D.   On: 04/22/2014 22:32     EKG Interpretation None      MDM   Final diagnoses:  None    Kayla Medina is a 81 y.o. female who p/w CP with H&P as above. ABCs intact. HDS, NAD.   Patient without chest pain in ED. EKG shows normal sinus rhythm with occasional PVCs and is not significantly changed from prior tracing.  Patient will be ruled out for ACS. Chest x-ray does not show pneumonia. Labs show + dimer and o/w unremarkable. While awaiting 2nd Tn, pt has onset of CP. Same as before. Repeat EKG unchanged. Delta Tn neg.  CT chest neg for PE but shows ATX and sign of questionable atypical PNA. Additionally, shows Mild mural thrombus and calcification at the origin of the left subclavian artery, with less than 50% luminal narrowing. Pt has 2+ equal radials BL.  Heart score 4.  Pt will require admission for further w/u.  After discussing with pt PCP, he would like cards involved. Discussed with cards, rec hospitalist admit. Discussed with hospitalist, who will admit. Rec starting ABX to cover HCAP (Vanc/Cefepime given), and consult Vasc regarding mural thrombus in L subclavian.  Vasc recs starting heparin and they will see in AM. Hep bolus given in ED. Admit to Hospitalist.  Pt seen in conjunction with Dr. Orpah Greek, *  Kirstie Peri, [facility] Resident - PGY-2     Kirstie Peri, MD 04/23/14 [id number]  Orpah Greek, MD 04/23/14 [phone]

## 2014-04-22 NOTE — Discharge Instructions (Signed)
Please make sure the Metformin Home medication gets held for 48 hours if the patient gets admitted. A nursing communication order has been placedMetformin and X-ray Contrast Studies For some X-ray exams, a contrast dye is used. Contrast dye is a type of medicine used to make the X-ray image clearer. The contrast dye is given to the patient through a vein (intravenously). If you need to have this type of X-ray exam and you take a medication called metformin, your caregiver may have you stop taking metformin before the exam.  LACTIC ACIDOSIS In rare cases, a serious medical condition called lactic acidosis can develop in people who take metformin and receive contrast dye. The following conditions can increase the risk of this complication:   Kidney failure.  Liver problems.  Certain types of heart problems such as:  Heart failure.  Heart attack.  Heart infection.  Heart valve problems.  Alcohol abuse. If left untreated, lactic acidosis can lead to coma.  SYMPTOMS OF LACTIC ACIDOSIS Symptoms of lactic acidosis can include:  Rapid breathing (hyperventilation).  Neurologic symptoms such as:  Headaches.  Confusion.  Dizziness.  Excessive sweating.  Feeling sick to your stomach (nauseous) or throwing up (vomiting). AFTER THE X-RAY EXAM  Stay well-hydrated. Drink fluids as instructed by your caregiver.  If you have a risk of developing lactic acidosis, blood tests may be done to make sure your kidney function is okay.  Metformin is usually stopped for 48 hours after the X-ray exam. Ask your caregiver when you can start taking metformin again. SEEK MEDICAL CARE IF:   You have shortness of breath or difficulty breathing.  You develop a headache that does not go away.  You have nausea or vomiting.  You urinate more than normal.  You develop a skin rash and have:  Redness.  Swelling.  Itching. Document Released: 02/24/2009 Document Revised: 05/31/2011 Document  Reviewed: 02/24/2009 [facility] Patient Information 2015 [place], [place]. This information is not intended to replace advice given to you by your health care provider. Make sure you discuss any questions you have with your health care provider. Marland Kitchen

## 2014-04-22 NOTE — ED Notes (Signed)
Pt returned from x-ray, placed back on heart monitor. Pt reports chest tightness, pt tearful. EKG done and given to Dr. Betsey Holiday also Dr. Balleh(resident) notified.

## 2014-04-22 NOTE — ED Notes (Signed)
Pt reports shakiness and feeling "like my sugar is low". CBG checked. Pt in NAD.

## 2014-04-23 DIAGNOSIS — R079 Chest pain, unspecified: Secondary | ICD-10-CM | POA: Diagnosis present

## 2014-04-23 DIAGNOSIS — I10 Essential (primary) hypertension: Secondary | ICD-10-CM | POA: Diagnosis present

## 2014-04-23 DIAGNOSIS — E119 Type 2 diabetes mellitus without complications: Secondary | ICD-10-CM | POA: Diagnosis present

## 2014-04-23 DIAGNOSIS — I8290 Acute embolism and thrombosis of unspecified vein: Secondary | ICD-10-CM

## 2014-04-23 DIAGNOSIS — I447 Left bundle-branch block, unspecified: Secondary | ICD-10-CM | POA: Diagnosis present

## 2014-04-23 DIAGNOSIS — E785 Hyperlipidemia, unspecified: Secondary | ICD-10-CM | POA: Diagnosis present

## 2014-04-23 DIAGNOSIS — Z7982 Long term (current) use of aspirin: Secondary | ICD-10-CM | POA: Diagnosis not present

## 2014-04-23 DIAGNOSIS — R0600 Dyspnea, unspecified: Secondary | ICD-10-CM

## 2014-04-23 DIAGNOSIS — I209 Angina pectoris, unspecified: Secondary | ICD-10-CM

## 2014-04-23 DIAGNOSIS — I82B12 Acute embolism and thrombosis of left subclavian vein: Secondary | ICD-10-CM | POA: Diagnosis present

## 2014-04-23 DIAGNOSIS — I70208 Unspecified atherosclerosis of native arteries of extremities, other extremity: Secondary | ICD-10-CM

## 2014-04-23 DIAGNOSIS — I5022 Chronic systolic (congestive) heart failure: Secondary | ICD-10-CM | POA: Diagnosis present

## 2014-04-23 HISTORY — DX: Acute embolism and thrombosis of unspecified vein: I82.90

## 2014-04-23 LAB — COMPREHENSIVE METABOLIC PANEL
ALBUMIN: 3.1 g/dL — AB (ref 3.5–5.2)
ALT: 13 U/L (ref 0–35)
ANION GAP: 9 (ref 5–15)
AST: 22 U/L (ref 0–37)
Alkaline Phosphatase: 65 U/L (ref 39–117)
BILIRUBIN TOTAL: 0.3 mg/dL (ref 0.3–1.2)
BUN: 11 mg/dL (ref 6–23)
CHLORIDE: 104 mmol/L (ref 96–112)
CO2: 23 mmol/L (ref 19–32)
Calcium: 8.5 mg/dL (ref 8.4–10.5)
Creatinine, Ser: 0.9 mg/dL (ref 0.50–1.10)
GFR calc Af Amer: 69 mL/min — ABNORMAL LOW (ref >90)
GFR calc non Af Amer: 59 mL/min — ABNORMAL LOW (ref >90)
GLUCOSE: 193 mg/dL — AB (ref 70–99)
POTASSIUM: 3.7 mmol/L (ref 3.5–5.1)
Sodium: 136 mmol/L (ref 135–145)
TOTAL PROTEIN: 6.3 g/dL (ref 6.0–8.3)

## 2014-04-23 LAB — CBC WITH DIFFERENTIAL/PLATELET
BASOS PCT: 0 % (ref 0–1)
Basophils Absolute: 0 10*3/uL (ref 0.0–0.1)
EOS PCT: 3 % (ref 0–5)
Eosinophils Absolute: 0.2 10*3/uL (ref 0.0–0.7)
HCT: 34.6 % — ABNORMAL LOW (ref 36.0–46.0)
Hemoglobin: 11.5 g/dL — ABNORMAL LOW (ref 12.0–15.0)
Lymphocytes Relative: 39 % (ref 12–46)
Lymphs Abs: 3.4 10*3/uL (ref 0.7–4.0)
MCH: 29.5 pg (ref 26.0–34.0)
MCHC: 33.2 g/dL (ref 30.0–36.0)
MCV: 88.7 fL (ref 78.0–100.0)
MONO ABS: 1 10*3/uL (ref 0.1–1.0)
MONOS PCT: 11 % (ref 3–12)
NEUTROS PCT: 47 % (ref 43–77)
Neutro Abs: 4.1 10*3/uL (ref 1.7–7.7)
Platelets: 367 10*3/uL (ref 150–400)
RBC: 3.9 MIL/uL (ref 3.87–5.11)
RDW: 13 % (ref 11.5–15.5)
WBC: 8.8 10*3/uL (ref 4.0–10.5)

## 2014-04-23 LAB — GLUCOSE, CAPILLARY
GLUCOSE-CAPILLARY: 229 mg/dL — AB (ref 70–99)
Glucose-Capillary: 159 mg/dL — ABNORMAL HIGH (ref 70–99)
Glucose-Capillary: 161 mg/dL — ABNORMAL HIGH (ref 70–99)
Glucose-Capillary: 175 mg/dL — ABNORMAL HIGH (ref 70–99)
Glucose-Capillary: 176 mg/dL — ABNORMAL HIGH (ref 70–99)
Glucose-Capillary: 183 mg/dL — ABNORMAL HIGH (ref 70–99)

## 2014-04-23 LAB — TSH: TSH: 5.257 u[IU]/mL — AB (ref 0.350–4.500)

## 2014-04-23 LAB — PROTIME-INR
INR: 1.01 (ref 0.00–1.49)
PROTHROMBIN TIME: 13.4 s (ref 11.6–15.2)

## 2014-04-23 LAB — TROPONIN I
Troponin I: 0.11 ng/mL — ABNORMAL HIGH (ref <0.031)
Troponin I: 0.11 ng/mL — ABNORMAL HIGH (ref <0.031)
Troponin I: 0.05 ng/mL — ABNORMAL HIGH (ref <0.031)

## 2014-04-23 LAB — HEPARIN LEVEL (UNFRACTIONATED)
Heparin Unfractionated: 0.56 IU/mL (ref 0.30–0.70)
Heparin Unfractionated: 0.39 IU/mL (ref 0.30–0.70)

## 2014-04-23 LAB — STREP PNEUMONIAE URINARY ANTIGEN: STREP PNEUMO URINARY ANTIGEN: NEGATIVE

## 2014-04-23 LAB — LIPID PANEL
CHOLESTEROL: 231 mg/dL — AB (ref 0–200)
HDL: 45 mg/dL (ref >39)
LDL Cholesterol: 127 mg/dL — ABNORMAL HIGH (ref 0–99)
Total CHOL/HDL Ratio: 5.1 RATIO
Triglycerides: 297 mg/dL — ABNORMAL HIGH (ref <150)
VLDL: 59 mg/dL — AB (ref 0–40)

## 2014-04-23 LAB — LEGIONELLA ANTIGEN, URINE

## 2014-04-23 LAB — CBG MONITORING, ED: Glucose-Capillary: 150 mg/dL — ABNORMAL HIGH (ref 70–99)

## 2014-04-23 MED ORDER — HEPARIN BOLUS VIA INFUSION
4000.0000 [IU] | Freq: Once | INTRAVENOUS | Status: AC
  Administered 2014-04-23: 4000 [IU] via INTRAVENOUS
  Filled 2014-04-23: qty 4000

## 2014-04-23 MED ORDER — BENZONATATE 100 MG PO CAPS
200.0000 mg | ORAL_CAPSULE | Freq: Three times a day (TID) | ORAL | Status: DC | PRN
  Administered 2014-04-24: 200 mg via ORAL
  Filled 2014-04-23 (×2): qty 2

## 2014-04-23 MED ORDER — ONDANSETRON HCL 4 MG PO TABS: 4.0000 mg | ORAL_TABLET | Freq: Four times a day (QID) | ORAL | Status: DC | PRN

## 2014-04-23 MED ORDER — LEVOFLOXACIN IN D5W 750 MG/150ML IV SOLN
750.0000 mg | INTRAVENOUS | Status: DC
  Administered 2014-04-24: 750 mg via INTRAVENOUS
  Filled 2014-04-23: qty 150

## 2014-04-23 MED ORDER — HEPARIN (PORCINE) IN NACL 100-0.45 UNIT/ML-% IJ SOLN
1050.0000 [IU]/h | INTRAMUSCULAR | Status: DC
  Administered 2014-04-23: 1050 [IU]/h via INTRAVENOUS
  Filled 2014-04-23: qty 250

## 2014-04-23 MED ORDER — HEPARIN SODIUM (PORCINE) 5000 UNIT/ML IJ SOLN: 60.0000 [IU]/kg | Freq: Once | INTRAMUSCULAR | Status: DC

## 2014-04-23 MED ORDER — ONDANSETRON HCL 4 MG/2ML IJ SOLN
4.0000 mg | Freq: Four times a day (QID) | INTRAMUSCULAR | Status: DC | PRN
  Administered 2014-04-23: 4 mg via INTRAVENOUS
  Filled 2014-04-23: qty 2

## 2014-04-23 MED ORDER — GUAIFENESIN ER 600 MG PO TB12
1200.0000 mg | ORAL_TABLET | Freq: Two times a day (BID) | ORAL | Status: DC
  Administered 2014-04-23 – 2014-04-25 (×5): 1200 mg via ORAL
  Filled 2014-04-23 (×6): qty 2

## 2014-04-23 MED ORDER — ONDANSETRON HCL 4 MG/2ML IJ SOLN
4.0000 mg | Freq: Once | INTRAMUSCULAR | Status: AC
  Administered 2014-04-23: 4 mg via INTRAVENOUS
  Filled 2014-04-23: qty 2

## 2014-04-23 MED ORDER — HEPARIN (PORCINE) IN NACL 100-0.45 UNIT/ML-% IJ SOLN
1000.0000 [IU]/h | INTRAMUSCULAR | Status: DC
  Administered 2014-04-23: 1000 [IU]/h via INTRAVENOUS
  Filled 2014-04-23 (×2): qty 250

## 2014-04-23 MED ORDER — SODIUM CHLORIDE 0.9 % IJ SOLN
3.0000 mL | Freq: Two times a day (BID) | INTRAMUSCULAR | Status: DC
  Administered 2014-04-23: 3 mL via INTRAVENOUS

## 2014-04-23 MED ORDER — SODIUM CHLORIDE 0.9 % IV SOLN: 250.0000 mL | INTRAVENOUS | Status: DC | PRN

## 2014-04-23 MED ORDER — LOSARTAN POTASSIUM 50 MG PO TABS
100.0000 mg | ORAL_TABLET | Freq: Two times a day (BID) | ORAL | Status: DC
  Administered 2014-04-23 – 2014-04-25 (×6): 100 mg via ORAL
  Filled 2014-04-23 (×7): qty 2

## 2014-04-23 MED ORDER — ASPIRIN 81 MG PO CHEW: 81.0000 mg | CHEWABLE_TABLET | ORAL | Status: DC

## 2014-04-23 MED ORDER — INSULIN ASPART 100 UNIT/ML ~~LOC~~ SOLN
0.0000 [IU] | SUBCUTANEOUS | Status: DC
  Administered 2014-04-23: 3 [IU] via SUBCUTANEOUS
  Administered 2014-04-23 (×3): 2 [IU] via SUBCUTANEOUS
  Administered 2014-04-23: 1 [IU] via SUBCUTANEOUS
  Administered 2014-04-23 – 2014-04-25 (×3): 2 [IU] via SUBCUTANEOUS
  Filled 2014-04-23: qty 1

## 2014-04-23 MED ORDER — ALPRAZOLAM 0.5 MG PO TABS
0.5000 mg | ORAL_TABLET | Freq: Every evening | ORAL | Status: DC | PRN
  Administered 2014-04-23 – 2014-04-24 (×2): 0.5 mg via ORAL
  Filled 2014-04-23: qty 1
  Filled 2014-04-23: qty 2

## 2014-04-23 MED ORDER — DEXTROSE 5 % IV SOLN: 1.0000 g | Freq: Once | INTRAVENOUS | Status: DC

## 2014-04-23 MED ORDER — LEVOFLOXACIN IN D5W 750 MG/150ML IV SOLN
750.0000 mg | INTRAVENOUS | Status: DC
  Administered 2014-04-23: 750 mg via INTRAVENOUS
  Filled 2014-04-23: qty 150

## 2014-04-23 MED ORDER — SODIUM CHLORIDE 0.9 % IJ SOLN
3.0000 mL | Freq: Two times a day (BID) | INTRAMUSCULAR | Status: DC
  Administered 2014-04-23 – 2014-04-24 (×4): 3 mL via INTRAVENOUS

## 2014-04-23 MED ORDER — MORPHINE SULFATE 2 MG/ML IJ SOLN
2.0000 mg | Freq: Once | INTRAMUSCULAR | Status: AC
  Administered 2014-04-23: 2 mg via INTRAVENOUS
  Filled 2014-04-23: qty 1

## 2014-04-23 MED ORDER — SODIUM CHLORIDE 0.9 % IJ SOLN: 3.0000 mL | INTRAMUSCULAR | Status: DC | PRN

## 2014-04-23 MED ORDER — SODIUM CHLORIDE 0.9 % IV SOLN
INTRAVENOUS | Status: DC
  Administered 2014-04-24: 04:00:00 via INTRAVENOUS

## 2014-04-23 MED ORDER — ASPIRIN EC 81 MG PO TBEC
81.0000 mg | DELAYED_RELEASE_TABLET | Freq: Every day | ORAL | Status: DC
  Administered 2014-04-23 – 2014-04-25 (×3): 81 mg via ORAL
  Filled 2014-04-23 (×3): qty 1

## 2014-04-23 MED ORDER — VANCOMYCIN HCL 10 G IV SOLR
1500.0000 mg | Freq: Once | INTRAVENOUS | Status: DC
  Filled 2014-04-23: qty 1500

## 2014-04-23 MED ORDER — MORPHINE SULFATE 2 MG/ML IJ SOLN
2.0000 mg | INTRAMUSCULAR | Status: DC | PRN
  Administered 2014-04-23 (×2): 2 mg via INTRAVENOUS
  Filled 2014-04-23 (×2): qty 1

## 2014-04-23 NOTE — Progress Notes (Signed)
ANTICOAGULATION CONSULT NOTE - Initial Consult  Pharmacy Consult for heparin  Indication: subclavian mural thrombus  Allergies  Allergen Reactions  . Codeine Nausea And Vomiting  . Januvia [Sitagliptin] Nausea And Vomiting  . Other Nausea And Vomiting    *all narcotic meds*  . Penicillins Hives  . Sulfonamide Derivatives Hives    Patient Measurements:   Heparin Dosing Weight:   Vital Signs: Temp: 97.9 F (36.6 C) (02/01 1826) Temp Source: Oral (02/01 1826) BP: 167/67 mmHg (02/02 0000) Pulse Rate: 70 (02/02 0000)  Labs:  Recent Labs  04/22/14 1830  HGB 12.1  HCT 35.9*  PLT 362  CREATININE 1.00    CrCl cannot be calculated (Unknown ideal weight.).   Medical History: Past Medical History  Diagnosis Date  . Chest pain, unspecified   . Other and unspecified hyperlipidemia   . Shortness of breath   . Palpitations   . Shingles     hx  . Diabetes mellitus without complication   . Hypertension   . Rosacea   . Anal fissure   . Pneumonia   . PVC (premature ventricular contraction)   . Glaucoma     pt. states no-glaucoma 07/20/13  . Subclinical hypothyroidism     Medications:  No oral anticoags PTA  Assessment: 81 yo with intermittent CP for 2-3 days. Lingering productive cough and fatigue. Tn negative. ACS r/o. VASC recommends full dose heparin for mural thrombus of left subclavian Goal of Therapy:  Heparin level 0.3-0.7 units/ml Monitor platelets by anticoagulation protocol: Yes   Plan:  Heparin 4000 unit bolus then 1000 units/hr.  HL in 8 hours Daily HL and CBC to begin 2/3  Curlene Dolphin 04/23/2014,12:47 AM

## 2014-04-23 NOTE — Consult Note (Signed)
[facility] [place] HISTORY AND PHYSICAL  Reason for consult: left subclavian mural thrombus Requesting: Dr Ernestina Patches History of Present Illness:  Patient is a 81 y.o. year old female who presents for evaluation of left subclavian stenosis incidental finding on CT yesterday for chest pain.  Pt admitted with dyspnea and left side chest pain.  She has no complaints of left hand pain numbness or tingling.  She denies exertional fatigue of her left upper extremity. No prior embolic events.  No history of afib.  Other medical problems include hyperlipidemia, diabetes, hypertension which are currently stable.  Admitted to rule out pneumonia/myocardial event.  She is currently on aspirin but denies taking this prior to admission. She has no history of ulcers or GI bleed.  Past Medical History  Diagnosis Date  . Chest pain, unspecified   . Other and unspecified hyperlipidemia   . Shortness of breath   . Palpitations   . Shingles     hx  . Diabetes mellitus without complication   . Hypertension   . Rosacea   . Anal fissure   . Pneumonia   . PVC (premature ventricular contraction)   . Glaucoma     pt. states no-glaucoma 07/20/13  . Subclinical hypothyroidism     Past Surgical History  Procedure Laterality Date  . Bladder repair    . Bowel resection    . Bilateral eye surgery/cataract repair  3/09  . Total abdominal hysterectomy    . Appendectomy      Social History History  Substance Use Topics  . Smoking status: Never Smoker   . Smokeless tobacco: Never Used  . Alcohol Use: No    Family History Family History  Problem Relation Age of Onset  . Alzheimer's disease Mother   . Hypertension Mother   . Thyroid disease Mother   . Alcoholism Father   . Prostate cancer Brother   . Seizures Sister   . Diabetes Maternal Grandmother   . Diabetes Daughter     x 2  . Diabetes Paternal Grandmother     entire family  . Thyroid disease Brother     x2   . Thyroid  disease Daughter   . Thyroid disease Son   . Other Sister     meningitis    Allergies  Allergies  Allergen Reactions  . Codeine Nausea And Vomiting  . Januvia [Sitagliptin] Nausea And Vomiting  . Other Nausea And Vomiting    *all narcotic meds*  . Penicillins Hives  . Sulfonamide Derivatives Hives     Current Facility-Administered Medications  Medication Dose Route Frequency Provider Last Rate Last Dose  . ALPRAZolam Duanne Moron) tablet 0.5 mg  0.5 mg Oral QHS PRN Shanda Howells, MD      . aspirin EC tablet 81 mg  81 mg Oral Daily Shanda Howells, MD      . heparin ADULT infusion 100 units/mL (25000 units/250 mL)  1,000 Units/hr Intravenous Continuous Orpah Greek, MD 10 mL/hr at 04/23/14 0122 1,000 Units/hr at 04/23/14 0122  . insulin aspart (novoLOG) injection 0-9 Units  0-9 Units Subcutaneous 6 times per day Shanda Howells, MD   2 Units at 04/23/14 0435  . [START ON 04/24/2014] levofloxacin (LEVAQUIN) IVPB 750 mg  750 mg Intravenous Q48H Rogue Bussing, [facility]      . losartan (COZAAR) tablet 100 mg  100 mg Oral BID Shanda Howells, MD   100 mg at 04/23/14 0115  . morphine 2 MG/ML injection 2-4 mg  2-4 mg Intravenous Q2H PRN Shanda Howells, MD   2 mg at 04/23/14 0523  . ondansetron (ZOFRAN) tablet 4 mg  4 mg Oral Q6H PRN Shanda Howells, MD       Or  . ondansetron [facility]) injection 4 mg  4 mg Intravenous Q6H PRN Shanda Howells, MD      . sodium chloride 0.9 % injection 3 mL  3 mL Intravenous Q12H Shanda Howells, MD   3 mL at 04/23/14 0428    ROS:   General:  No weight loss, Fever, chills  HEENT: No recent headaches, no nasal bleeding, no visual changes, no sore throat  Neurologic: No dizziness, blackouts, seizures. No recent symptoms of stroke or mini- stroke. No recent episodes of slurred speech, or temporary blindness.  Cardiac: + recent episodes of chest pain/pressure, no shortness of breath at rest.  + shortness of breath with exertion.  Denies history of atrial  fibrillation or irregular heartbeat  Vascular: No history of rest pain in feet.  No history of claudication.  No history of non-healing ulcer, No history of DVT   Pulmonary: No home oxygen, no productive cough, no hemoptysis,  No asthma or wheezing  Musculoskeletal:  [ ]  Arthritis, [ ]  Low back pain,  [ ]  Joint pain  Hematologic:No history of hypercoagulable state.  No history of easy bleeding.  No history of anemia  Gastrointestinal: No hematochezia or melena,  No gastroesophageal reflux, no trouble swallowing  Urinary: [ ]  chronic Kidney disease, [ ]  on HD - [ ]  MWF or [ ]  TTHS, [ ]  Burning with urination, [ ]  Frequent urination, [ ]  Difficulty urinating;   Skin: No rashes  Psychological: No history of anxiety,  No history of depression   Physical Examination  Filed Vitals:   04/23/14 0100 04/23/14 0134 04/23/14 0208 04/23/14 0453  BP: 158/67 152/66 160/74 138/67  Pulse: 66 82 82 77  Temp:   97.7 F (36.5 C) 97.7 F (36.5 C)  TempSrc:   Oral Oral  Resp: 17 18 18 18   Height:      Weight:   160 lb (72.576 kg)   SpO2: 95% 97% 98% 99%    Body mass index is 25.84 kg/(m^2).  General:  Alert and oriented, no acute distress HEENT: Normal Cardiac: Regular Rate and Rhythm Skin: No rash Extremity Pulses:  2+ radial, brachial pulses bilaterally Musculoskeletal: No deformity or edema  Neurologic: Upper extremity motor 5/5 and symmetric  DATA:  CTA from yesterday images reviewed.   Mild narrowing of left subclavian artery less than 50% with some adjacent mural thrombus vs. Soft plaque.  Patent distal subclavian and axillary system   ASSESSMENT:  Athersclerosis with mild narrowing of left subclavian asymptomatic.  The mural thrombus can be normal variant of atherosclerosis.  Overall low risk of embolization and seen fairly routinely.  No current evidence of embolization.   PLAN:  1.  May d/c heparin from my standpoint.  2.  Agree with aspirin daily here and on discharge  3.  No  follow up necessary unless develops symptoms of exertional fatigue left arm or evidence of embolization.  Will sign off  Ruta Hinds, MD [facility] [place] Office: [phone] Pager: [phone]

## 2014-04-23 NOTE — Progress Notes (Signed)
ANTICOAGULATION CONSULT NOTE - FOLLOW UP   HL = 0.39 (goal 0.3 - 0.7 units/mL) Heparin dosing weight = 73 kg   Assessment: 81 YOF continues on IV heparin for ACS.  Confirmatory heparin level drawn early but remained therapeutic.  No bleeding reported.  Cath postponed until tomorrow.   Plan: - Increase heparin gtt slightly to 1050 units/hr - F/U AM labs    Kell Ferris D. Mina Marble, PharmD, BCPS 04/23/2014, 7:25 PM

## 2014-04-23 NOTE — Progress Notes (Signed)
UR completed 

## 2014-04-23 NOTE — Progress Notes (Signed)
Primary Care Courtesy Note- History and events reviewed and discussed with patient- 81 year old WF with multiple cardiac risk factors including DM2, HTN, Hyperlipemia which have been marginally controlled due to statin intolerance, intolerance to BP medications and patient reluctance to add additional medications.  Presented with prolonged chest pain with some atypical features (3 days waxing and waning) but some exertional component, left arm radiation and associated shortness of breath. Recent treatment for bronchitis with Levaquin with cough, dyspnea and fatigue.  CT angio shows no PE and possible atypical infection (fairly minimal) vs. Atelectasis.  She is afebrile with normal WBC and O2 sats.  I was initially called for admission and requested cardiology evaluation due to high risk profile, ongoing chest pain and EKG changes.  Admitted by Hospitalists. Upon my review, EKG reveals new LBBB compared to 3/15.  Initial cardiac enzymes negative but subsequently increased to 0.11 X 2.  Defer further workup to Cardiology- if this is deemed non-cardiac and patient to remain inpatient I am happy to transfer to my service.  Otherwise, prefer cardiology management and I will follow.  Will follow-up pending cardiology evaluation.

## 2014-04-23 NOTE — Progress Notes (Signed)
Pt arrived on unit rm 2W38 0200 hrs, A&O x4, C/O mid sternum chest pain 3/10, admission orders being implemented, pt oriented to room equipment.

## 2014-04-23 NOTE — H&P (Addendum)
Hospitalist Admission History and Physical  Patient name: Kayla Medina record number: [id number] Date of birth: 12-Feb-1933 Age: 81 y.o. Gender: female  Primary Care Provider: Marton Redwood, MD  Chief Complaint: chest pain, dyspnea, subclavian mural thrombus   History of Present Illness:This is a 81 y.o. year old female with significant past medical history of HTN, type 2 DM, PVCs presenting with chest pain, dyspnea, subclavian mural thrombus. Patient and daughter at bedside. Patient reports coming down with the flu approximately 2 weeks ago. Initially had fever, malaise, cough. Patient states that malaise and fevers have subsided however coughers persisted. Has had progressively worsening chest pain over this timeframe. Pain is central in nature with intermittent radiation to the neck and down the left arm. Pain is present both at rest as well as with coughing. Pain is only relieved by intermittently laying on the right side. Has also had progressive shortness of breath. Nonsmoker. Per report, patient informed her PCP if symptoms and was directed to the ER for further evaluation. Presented to the ER temperature 97.9, heart rate in the 60s 70s, respirations in the tens to 20s, blood pressure in the 140s to 170s. Satting 98% on room air. White blood cell count 8.0, hemoglobin 12.1, creatinine 1. Glucose 286. Troponin negative 2. EKG sinus rhythm with intermittent PVCs. Chest x-ray with no acute disease. Follow-up CT angiogram is negative for PE however does show mild bilateral subsegmental atelectasis-? Atypical infectious processes. Patient also with a positive mild mural thrombus and calcification at the margin of the left subclavian artery with less than 50% luminal narrowing. As well as nonspecific decreased attenuation within the liver.    Assessment and Plan: Kayla Medina is a 81 y.o. year old female presenting with chest pain, dyspnea, obstructive   Active Problems:   Chest pain    Dyspnea   Obstructive thrombus   1- Chest Pain  -suspect large pleuritic and MSK component to sxs  - + anterior chest wall TTP -however, pt w/ fair amount of typical sxs w/ moderate risk factors including age, HTN, DM -cycle CEs -risk stratification labs  -IV morphine for pain  -baby ASA  -tele bed  2- Dyspnea -no hypoxia on presentation  -suspect pleuritic component  -? PNA on CT Angio -post flu in elderly diabetic pt  -place on IV levaquin  -blood/sputum culture -urine strep and legionella  -supplemental O2 prn   3-Mural thrombus -unclear etiology  -ER resident discussed case w/ vascular per report  -IV heparin  -anticipate formal consult in am   4- DM  -SSI  -A1C -hold oral regimen   FEN/GI: heart healthy-carb modified diet  Prophylaxis: heparin gtt Disposition: pending further evaluation  Code Status:Full Code    Patient Active Problem List   Diagnosis Date Noted  . Chest pain 04/23/2014  . Dyspnea 04/23/2014  . Obstructive thrombus 04/23/2014  . Orthostasis 06/01/2013  . Ataxia 06/01/2013  . DM 04/17/2009  . HYPERLIPIDEMIA-MIXED 04/17/2009  . CHEST PAIN-UNSPECIFIED 04/17/2009  . PALPITATIONS 04/14/2009  . DYSPNEA 04/14/2009   Past Medical History: Past Medical History  Diagnosis Date  . Chest pain, unspecified   . Other and unspecified hyperlipidemia   . Shortness of breath   . Palpitations   . Shingles     hx  . Diabetes mellitus without complication   . Hypertension   . Rosacea   . Anal fissure   . Pneumonia   . PVC (premature ventricular contraction)   . Glaucoma  pt. states no-glaucoma 07/20/13  . Subclinical hypothyroidism     Past Surgical History: Past Surgical History  Procedure Laterality Date  . Bladder repair    . Bowel resection    . Bilateral eye surgery/cataract repair  3/09  . Total abdominal hysterectomy    . Appendectomy      Social History: History   Social History  . Marital Status: Married    Spouse  Name: N/A    Number of Children: 6  . Years of Education: N/A   Occupational History  . CNA    Social History Main Topics  . Smoking status: Never Smoker   . Smokeless tobacco: Never Used  . Alcohol Use: No  . Drug Use: No  . Sexual Activity: None   Other Topics Concern  . 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 Alzheimer's disease Mother   . Hypertension Mother   . Thyroid disease Mother   . Alcoholism Father   . Prostate cancer Brother   . Seizures Sister   . Diabetes Maternal Grandmother   . Diabetes Daughter     x 2  . Diabetes Paternal Grandmother     entire family  . Thyroid disease Brother     x2   . Thyroid disease Daughter   . Thyroid disease Son   . Other Sister     meningitis    Allergies: Allergies  Allergen Reactions  . Codeine Nausea And Vomiting  . Januvia [Sitagliptin] Nausea And Vomiting  . Other Nausea And Vomiting    *all narcotic meds*  . Penicillins Hives  . Sulfonamide Derivatives Hives    Current Facility-Administered Medications  Medication Dose Route Frequency Provider Last Rate Last Dose  . aspirin EC tablet 81 mg  81 mg Oral Daily Shanda Howells, MD      . levofloxacin [facility]) IVPB 750 mg  750 mg Intravenous Q24H Shanda Howells, MD      . morphine 2 MG/ML injection 2-4 mg  2-4 mg Intravenous Q2H PRN Shanda Howells, MD      . ondansetron [facility]) tablet 4 mg  4 mg Oral Q6H PRN Shanda Howells, MD       Or  . ondansetron [facility]) injection 4 mg  4 mg Intravenous Q6H PRN Shanda Howells, MD      . sodium chloride 0.9 % injection 3 mL  3 mL Intravenous Q12H Shanda Howells, MD       Current Outpatient Prescriptions  Medication Sig Dispense Refill  . ALPRAZolam (XANAX) 0.5 MG tablet Take 0.5 mg by mouth at bedtime as needed for anxiety.    . cholecalciferol (VITAMIN D) 1000 UNITS tablet Take  1,000 Units by mouth daily.    . Coenzyme Q10 (CO Q-10) 30 MG CAPS Take 30 mg by mouth daily.     Marland Kitchen desonide (DESOWEN) 0.05 % cream Apply 1 application topically 2 (two) times daily as needed (for itching/dry skin).    Marland Kitchen guaiFENesin (MUCINEX) 600 MG 12 hr tablet Take 1 tablet (600 mg total) by mouth 2 (two) times daily as needed for cough or to loosen phlegm.    Marland Kitchen losartan (COZAAR) 100 MG tablet Take 100 mg by mouth 2 (two) times daily.     . meclizine (ANTIVERT) 25 MG tablet Take 1 tablet (25 mg total) by mouth 3 (three) times daily as needed for  dizziness. 30 tablet 0  . metFORMIN (GLUCOPHAGE) 1000 MG tablet Take 1,000 mg by mouth 2 (two) times daily.     . vitamin B-12 (CYANOCOBALAMIN) 1000 MCG tablet Take 1,000 mcg by mouth daily.    . vitamin C (ASCORBIC ACID) 500 MG tablet Take 500 mg by mouth daily.    . vitamin E 600 UNIT capsule Take 400 Units by mouth daily.     . fluconazole (DIFLUCAN) 100 MG tablet 1 tablet daily for 7 days (Patient not taking: Reported on 04/22/2014) 7 tablet 0  . montelukast (SINGULAIR) 10 MG tablet Take 10 mg by mouth at bedtime.      Review Of Systems: 12 point ROS negative except as noted above in HPI.  Physical Exam: Filed Vitals:   04/23/14 0000  BP: 167/67  Pulse: 70  Temp:   Resp: 13    General: alert and cooperative HEENT: PERRLA and extra ocular movement intact Heart: S1, S2 normal, no murmur, rub or gallop, regular rate and rhythm Lungs: clear to auscultation, no wheezes or rales and unlabored breathing, + anterior chest wall TTP  Abdomen: abdomen is soft without significant tenderness, masses, organomegaly or guarding Extremities: extremities normal, atraumatic, no cyanosis or edema Skin:no rashes Neurology: normal without focal findings  Labs and Imaging: Lab Results  Component Value Date/Time   NA 137 04/22/2014 06:30 PM   K 3.9 04/22/2014 06:30 PM   CL 108 04/22/2014 06:30 PM   CO2 21 04/22/2014 06:30 PM   BUN 16 04/22/2014 06:30 PM    CREATININE 1.00 04/22/2014 06:30 PM   GLUCOSE 286* 04/22/2014 06:30 PM   Lab Results  Component Value Date   WBC 8.1 04/22/2014   HGB 12.1 04/22/2014   HCT 35.9* 04/22/2014   MCV 88.6 04/22/2014   PLT 362 04/22/2014    Dg Chest 2 View  04/22/2014   CLINICAL DATA:  Chest pain and tightness. Left arm pain. Cough. Shortness of breath.  EXAM: CHEST  2 VIEW  COMPARISON:  None.  FINDINGS: Midline trachea. Normal heart size. Atherosclerosis in the transverse aorta. No pleural effusion or pneumothorax. Mild biapical pleural-parenchymal scarring. Greater on the left. Mild volume loss and probable scarring at the left lung base.  IMPRESSION: No acute cardiopulmonary disease.  Aortic atherosclerosis.   Electronically Signed   By: Abigail Miyamoto M.D.   On: 04/22/2014 19:59   Ct Angio Chest Pe W/cm &/or Wo Cm  04/22/2014   CLINICAL DATA:  Acute onset of sharp mid chest pain for several days and left arm pain. Recent cough. Initial encounter.  EXAM: CT ANGIOGRAPHY CHEST WITH CONTRAST  TECHNIQUE: Multidetector CT imaging of the chest was performed using the standard protocol during bolus administration of intravenous contrast. Multiplanar CT image reconstructions and MIPs were obtained to evaluate the vascular anatomy.  CONTRAST:  80 mL of Omnipaque 350 IV contrast  COMPARISON:  Chest radiograph performed earlier today at 7:15 p.m.  FINDINGS: There is no evidence of pulmonary embolus.  Mild bilateral dependent subsegmental atelectasis is noted, with minimal associated nonspecific nodularity. Additional relatively diffuse minimal peripheral nodularity is noted within the upper lung lobes. This could reflect a mild atypical infectious process. There is no evidence of significant focal consolidation, pleural effusion or pneumothorax. No masses are identified; no abnormal focal contrast enhancement is seen.  Scattered mediastinal nodes remain normal in size, without evidence of mediastinal lymphadenopathy. No  pericardial effusion is identified. Mild mural thrombus and calcification is noted at the origin of the left  subclavian artery, with less than 50% luminal narrowing. The great vessels are otherwise grossly unremarkable. No axillary lymphadenopathy is seen. The thyroid gland is unremarkable in appearance.  Nonspecific vaguely decreased attenuation is noted within the liver near the hepatic hilum. The visualized portions of the spleen are unremarkable.  No acute osseous abnormalities are seen.  Review of the MIP images confirms the above findings.  IMPRESSION: 1. No evidence of pulmonary embolus. 2. Mild bilateral dependent subsegmental atelectasis, with minimal associated nonspecific nodularity. Additional relatively diffuse minimal peripheral nodularity within the upper lung lobes. This could reflect a mild atypical infectious process. 3. Mild mural thrombus and calcification at the origin of the left subclavian artery, with less than 50% luminal narrowing. 4. Nonspecific vaguely decreased attenuation within the liver near the hepatic hilum. This distribution is somewhat unusual for fatty infiltration, though incompletely visualized. Would correlate with LFTs and consider dynamic liver protocol MRI or CT for further evaluation.   Electronically Signed   By: Garald Balding M.D.   On: 04/22/2014 22:32           Shanda Howells MD  Pager: [phone]

## 2014-04-23 NOTE — Consult Note (Signed)
CONSULT NOTE  Date: 04/23/2014               Patient Name:  Kayla Medina MRN: [id number]  DOB: 04/12/1932 Age / Sex: 81 y.o., female        PCP: Marton Redwood Primary Cardiologist: New/Nahser            Referring Physician: Brigitte Pulse              Reason for Consult: Chest pain , + troponin            History of Present Illness: Patient is a 81 y.o. female with a PMHx of HTN, DM, , who was admitted to [facility] on 04/22/2014 for evaluation of  Chest discomfort.   Patient has had flu like symptoms for the past week or so .  Fever, malaise, cough.   2 nights ago she had  chest heaviness and chest  pressure. The symptoms lasted for several hours. She took a sleeping pill and eventually went to sleep. The pain had resolved by morning.  She now has recurrent chest pain every time she gets up and walks around. The pain typically last for several minutes. This associated with generalized weakness and shortness of breath. It typically resolves when she stops and rest. She does not get any regular exercise. She does minimal housework.    Medications: Outpatient medications: Prescriptions prior to admission  Medication Sig Dispense Refill Last Dose  . ALPRAZolam (XANAX) 0.5 MG tablet Take 0.5 mg by mouth at bedtime as needed for anxiety.   04/21/2014 at Unknown time  . cholecalciferol (VITAMIN D) 1000 UNITS tablet Take 1,000 Units by mouth daily.   04/22/2014 at Unknown time  . Coenzyme Q10 (CO Q-10) 30 MG CAPS Take 30 mg by mouth daily.    04/22/2014 at Unknown time  . desonide (DESOWEN) 0.05 % cream Apply 1 application topically 2 (two) times daily as needed (for itching/dry skin).   04/22/2014 at Unknown time  . guaiFENesin (MUCINEX) 600 MG 12 hr tablet Take 1 tablet (600 mg total) by mouth 2 (two) times daily as needed for cough or to loosen phlegm.   04/22/2014 at Unknown time  . losartan (COZAAR) 100 MG tablet Take 100 mg by mouth 2 (two) times daily.    04/22/2014 at Unknown time  . meclizine  (ANTIVERT) 25 MG tablet Take 1 tablet (25 mg total) by mouth 3 (three) times daily as needed for dizziness. 30 tablet 0 Past Week at Unknown time  . metFORMIN (GLUCOPHAGE) 1000 MG tablet Take 1,000 mg by mouth 2 (two) times daily.    04/22/2014 at Unknown time  . vitamin B-12 (CYANOCOBALAMIN) 1000 MCG tablet Take 1,000 mcg by mouth daily.   Past Week at Unknown time  . vitamin C (ASCORBIC ACID) 500 MG tablet Take 500 mg by mouth daily.   04/22/2014 at Unknown time  . vitamin E 600 UNIT capsule Take 400 Units by mouth daily.    04/22/2014 at Unknown time  . fluconazole (DIFLUCAN) 100 MG tablet 1 tablet daily for 7 days (Patient not taking: Reported on 04/22/2014) 7 tablet 0 Completed Course at Unknown time  . montelukast (SINGULAIR) 10 MG tablet Take 10 mg by mouth at bedtime.    Not Taking at Unknown time    Current medications: Current Facility-Administered Medications  Medication Dose Route Frequency Provider Last Rate Last Dose  . ALPRAZolam Duanne Moron) tablet 0.5 mg  0.5 mg Oral QHS PRN Shanda Howells,  MD      . aspirin EC tablet 81 mg  81 mg Oral Daily Shanda Howells, MD      . heparin ADULT infusion 100 units/mL (25000 units/250 mL)  1,000 Units/hr Intravenous Continuous Orpah Greek, MD 10 mL/hr at 04/23/14 0122 1,000 Units/hr at 04/23/14 0122  . insulin aspart (novoLOG) injection 0-9 Units  0-9 Units Subcutaneous 6 times per day Shanda Howells, MD   2 Units at 04/23/14 0818  . [START ON 04/24/2014] levofloxacin (LEVAQUIN) IVPB 750 mg  750 mg Intravenous Q48H [facility], [facility]      . losartan (COZAAR) tablet 100 mg  100 mg Oral BID Shanda Howells, MD   100 mg at 04/23/14 0115  . morphine 2 MG/ML injection 2-4 mg  2-4 mg Intravenous Q2H PRN Shanda Howells, MD   2 mg at 04/23/14 0523  . ondansetron (ZOFRAN) tablet 4 mg  4 mg Oral Q6H PRN Shanda Howells, MD       Or  . ondansetron [facility]) injection 4 mg  4 mg Intravenous Q6H PRN Shanda Howells, MD      . sodium chloride 0.9 % injection 3 mL   3 mL Intravenous Q12H Shanda Howells, MD   3 mL at 04/23/14 0428     Allergies  Allergen Reactions  . Codeine Nausea And Vomiting  . Januvia [Sitagliptin] Nausea And Vomiting  . Other Nausea And Vomiting    *all narcotic meds*  . Penicillins Hives  . Sulfonamide Derivatives Hives     Past Medical History  Diagnosis Date  . Chest pain, unspecified   . Other and unspecified hyperlipidemia   . Shortness of breath   . Palpitations   . Shingles     hx  . Diabetes mellitus without complication   . Hypertension   . Rosacea   . Anal fissure   . Pneumonia   . PVC (premature ventricular contraction)   . Glaucoma     pt. states no-glaucoma 07/20/13  . Subclinical hypothyroidism     Past Surgical History  Procedure Laterality Date  . Bladder repair    . Bowel resection    . Bilateral eye surgery/cataract repair  3/09  . Total abdominal hysterectomy    . Appendectomy      Family History  Problem Relation Age of Onset  . Alzheimer's disease Mother   . Hypertension Mother   . Thyroid disease Mother   . Alcoholism Father   . Prostate cancer Brother   . Seizures Sister   . Diabetes Maternal Grandmother   . Diabetes Daughter     x 2  . Diabetes Paternal Grandmother     entire family  . Thyroid disease Brother     x2   . Thyroid disease Daughter   . Thyroid disease Son   . Other Sister     meningitis    Social History:  reports that she has never smoked. She has never used smokeless tobacco. She reports that she does not drink alcohol or use illicit drugs.   Review of Systems: Constitutional:  admits to fever, chills, diaphoresis, appetite change and fatigue several weeks ago when she had the flu  HEENT: denies photophobia, eye pain, redness, hearing loss, ear pain, congestion, sore throat, rhinorrhea, sneezing, neck pain, neck stiffness and tinnitus.  Respiratory: admits to SOB, DOE, cough, chest tightness, and wheezing.  Cardiovascular: admits to chest pain,  palpitations and leg swelling.  Gastrointestinal: denies nausea, vomiting, abdominal pain, diarrhea, constipation, blood in  stool.  Genitourinary: denies dysuria, urgency, frequency, hematuria, flank pain and difficulty urinating.  Musculoskeletal: denies  myalgias, back pain, joint swelling, arthralgias and gait problem.   Skin: denies pallor, rash and wound.  Neurological: denies dizziness, seizures, syncope, weakness, light-headedness, numbness and headaches.   Hematological: denies adenopathy, easy bruising, personal or family bleeding history.  Psychiatric/ Behavioral: denies suicidal ideation, mood changes, confusion, nervousness, sleep disturbance and agitation.    Physical Exam: BP 138/67 mmHg  Pulse 77  Temp(Src) 97.7 F (36.5 C) (Oral)  Resp 18  Ht 5\' 6"  (1.676 m)  Wt 160 lb (72.576 kg)  BMI 25.84 kg/m2  SpO2 99%  Wt Readings from Last 3 Encounters:  04/23/14 160 lb (72.576 kg)  07/20/13 162 lb (73.483 kg)  06/14/13 162 lb (73.483 kg)    General: Vital signs reviewed and noted. Well-developed, well-nourished, in no acute distress; alert,   Head: Normocephalic, atraumatic, sclera anicteric,   Neck: Supple. Negative for carotid bruits. No JVD   Lungs:  Clear bilaterally, no  wheezes, rales, or rhonchi. Breathing is normal   Heart: RRR with S1 S2. No murmurs, rubs, or gallops   Abdomen/ GI :  Soft, non-tender, non-distended with normoactive bowel sounds. No hepatomegaly. No rebound/guarding. No obvious abdominal masses   MSK: Strength and the appear normal for age.   Extremities: No clubbing or cyanosis. No edema.  Distal pedal pulses are 2+ and equal   Neurologic:  CN are grossly intact,  No obvious motor or sensory defect.  Alert and oriented X 3. Moves all extremities spontaneously.  Psych: Responds to questions appropriately with a normal affect.     Lab results: Basic Metabolic Panel:  Recent Labs Lab 04/22/14 1830 04/23/14 0610  NA 137 136  K 3.9 3.7  CL  108 104  CO2 21 23  GLUCOSE 286* 193*  BUN 16 11  CREATININE 1.00 0.90  CALCIUM 8.9 8.5    Liver Function Tests:  Recent Labs Lab 04/23/14 0610  AST 22  ALT 13  ALKPHOS 65  BILITOT 0.3  PROT 6.3  ALBUMIN 3.1*   No results for input(s): LIPASE, AMYLASE in the last 168 hours. No results for input(s): AMMONIA in the last 168 hours.  CBC:  Recent Labs Lab 04/22/14 1830 04/23/14 0610  WBC 8.1 8.8  NEUTROABS  --  4.1  HGB 12.1 11.5*  HCT 35.9* 34.6*  MCV 88.6 88.7  PLT 362 367    Cardiac Enzymes:  Recent Labs Lab 04/23/14 0045 04/23/14 0610  TROPONINI 0.11* 0.11*    BNP: Invalid input(s): POCBNP  CBG:  Recent Labs Lab 04/22/14 2147 04/23/14 0103 04/23/14 0400 04/23/14 0805  GLUCAP 139* 150* 175* 183*    Coagulation Studies: No results for input(s): LABPROT, INR in the last 72 hours.   Other results: Personal review of EKG shows :  -NSR, new LBBB. The LBBB is new since her previous ECG   Imaging: Dg Chest 2 View  04/22/2014   CLINICAL DATA:  Chest pain and tightness. Left arm pain. Cough. Shortness of breath.  EXAM: CHEST  2 VIEW  COMPARISON:  None.  FINDINGS: Midline trachea. Normal heart size. Atherosclerosis in the transverse aorta. No pleural effusion or pneumothorax. Mild biapical pleural-parenchymal scarring. Greater on the left. Mild volume loss and probable scarring at the left lung base.  IMPRESSION: No acute cardiopulmonary disease.  Aortic atherosclerosis.   Electronically Signed   By: Abigail Miyamoto M.D.   On: 04/22/2014 19:59   Ct Angio  Chest Pe W/cm &/or Wo Cm  04/22/2014   CLINICAL DATA:  Acute onset of sharp mid chest pain for several days and left arm pain. Recent cough. Initial encounter.  EXAM: CT ANGIOGRAPHY CHEST WITH CONTRAST  TECHNIQUE: Multidetector CT imaging of the chest was performed using the standard protocol during bolus administration of intravenous contrast. Multiplanar CT image reconstructions and MIPs were obtained to  evaluate the vascular anatomy.  CONTRAST:  80 mL of Omnipaque 350 IV contrast  COMPARISON:  Chest radiograph performed earlier today at 7:15 p.m.  FINDINGS: There is no evidence of pulmonary embolus.  Mild bilateral dependent subsegmental atelectasis is noted, with minimal associated nonspecific nodularity. Additional relatively diffuse minimal peripheral nodularity is noted within the upper lung lobes. This could reflect a mild atypical infectious process. There is no evidence of significant focal consolidation, pleural effusion or pneumothorax. No masses are identified; no abnormal focal contrast enhancement is seen.  Scattered mediastinal nodes remain normal in size, without evidence of mediastinal lymphadenopathy. No pericardial effusion is identified. Mild mural thrombus and calcification is noted at the origin of the left subclavian artery, with less than 50% luminal narrowing. The great vessels are otherwise grossly unremarkable. No axillary lymphadenopathy is seen. The thyroid gland is unremarkable in appearance.  Nonspecific vaguely decreased attenuation is noted within the liver near the hepatic hilum. The visualized portions of the spleen are unremarkable.  No acute osseous abnormalities are seen.  Review of the MIP images confirms the above findings.  IMPRESSION: 1. No evidence of pulmonary embolus. 2. Mild bilateral dependent subsegmental atelectasis, with minimal associated nonspecific nodularity. Additional relatively diffuse minimal peripheral nodularity within the upper lung lobes. This could reflect a mild atypical infectious process. 3. Mild mural thrombus and calcification at the origin of the left subclavian artery, with less than 50% luminal narrowing. 4. Nonspecific vaguely decreased attenuation within the liver near the hepatic hilum. This distribution is somewhat unusual for fatty infiltration, though incompletely visualized. Would correlate with LFTs and consider dynamic liver protocol MRI  or CT for further evaluation.   Electronically Signed   By: Garald Balding M.D.   On: 04/22/2014 22:32       Assessment & Plan:  1. Unstable angina: Patient presents with symptoms that are consistent with unstable angina. She has chest heaviness and chest pressure associated with profound weakness and a "sick feeling".    This is associated with a mildly elevated troponin level and a new left bundle-branch block. Given all of this information, I think that she needs a cardiac catheterization. We discussed the risks, benefits, and options of Cardec cavitation. She understands and agrees to proceed. I discussed this also with her son who is present during the exam and interview.  Continue heparin. We will schedule her for cardiac Rosacea later today. We will hold her metformin.  2. Diabetes mellitus: Her hemoglobin A1c levels have been mildly elevated. Further plans per Dr. Brigitte Pulse.  3. Hypertension: Stable   Thayer Headings, Brooke Bonito., MD, [facility] 04/23/2014, 9:12 AM Office - [phone] Pager [phone]

## 2014-04-23 NOTE — Progress Notes (Signed)
Inpatient Diabetes Program Recommendations  AACE/ADA: New Consensus Statement on Inpatient Glycemic Control (2013)  Target Ranges:  Prepandial:   less than 140 mg/dL      Peak postprandial:   less than 180 mg/dL (1-2 hours)      Critically ill patients:  140 - 180 mg/dL   Reason for Assessment:  Results for Kayla Medina, Kayla Medina (MRN [id number]) as of 04/23/2014 10:29  Ref. Range 07/20/2013 14:53 04/22/2014 21:47 04/23/2014 01:03 04/23/2014 04:00 04/23/2014 08:05  Glucose-Capillary Latest Range: 70-99 mg/dL 125 (H) 139 (H) 150 (H) 175 (H) 183 (H)   Diabetes history: Type 2 diabetes/  See's PCP Dr. Brigitte Pulse Outpatient Diabetes medications: Metformin 1000 mg bid Current orders for Inpatient glycemic control:  Novolog sensitive q 4 hours.  May consider changing Novolog correction to moderate tid with meals since patient is on PO diet.    Thanks, Adah Perl, RN, BC-ADM Inpatient Diabetes Coordinator Pager [phone]

## 2014-04-23 NOTE — Progress Notes (Signed)
ANTICOAGULATION CONSULT NOTE - Initial Consult  Pharmacy Consult for heparin  Indication: ACS  Allergies  Allergen Reactions  . Codeine Nausea And Vomiting  . Januvia [Sitagliptin] Nausea And Vomiting  . Other Nausea And Vomiting    *all narcotic meds*  . Penicillins Hives  . Sulfonamide Derivatives Hives    Patient Measurements: Height: 5\' 6"  (167.6 cm) Weight: 160 lb (72.576 kg) IBW/kg (Calculated) : 59.3 Heparin Dosing Weight: 72.5 kg  Vital Signs: Temp: 97.7 F (36.5 C) (02/02 0453) Temp Source: Oral (02/02 0453) BP: 138/67 mmHg (02/02 0453) Pulse Rate: 77 (02/02 0453)  Labs:  Recent Labs  04/22/14 1830 04/23/14 0045 04/23/14 0610 04/23/14 0935  HGB 12.1  --  11.5*  --   HCT 35.9*  --  34.6*  --   PLT 362  --  367  --   HEPARINUNFRC  --   --   --  0.56  CREATININE 1.00  --  0.90  --   TROPONINI  --  0.11* 0.11*  --     Estimated Creatinine Clearance: 51.7 mL/min (by C-G formula based on Cr of 0.9).   Medical History: Past Medical History  Diagnosis Date  . Chest pain, unspecified   . Other and unspecified hyperlipidemia   . Shortness of breath   . Palpitations   . Shingles     hx  . Diabetes mellitus without complication   . Hypertension   . Rosacea   . Anal fissure   . Pneumonia   . PVC (premature ventricular contraction)   . Glaucoma     pt. states no-glaucoma 07/20/13  . Subclinical hypothyroidism     Medications:  No oral anticoags PTA  Assessment: 81 yo with intermittent CP for 2-3 days, started on IV heparin. Heparin level 0.56 is therapeutic on 1000 units/hr. Cardiology consulted, plan for cath later today. Hgb 11.5, plt 367k stable. No bleeding noted per chart.  Goal of Therapy:  Heparin level 0.3-0.7 units/ml Monitor platelets by anticoagulation protocol: Yes   Plan:  Continue Heparin 1000 units/hr.  Confirmatory heparin level at 1800 Daily HL and CBC to begin 2/3 F/u after cath.  Maryanna Shape, PharmD, BCPS  Clinical  Pharmacist  Pager: [phone]   04/23/2014,10:22 AM

## 2014-04-23 NOTE — Progress Notes (Signed)
Patient admitted earlier this morning by Dr. Shanda Howells. Agree with current assessment and plan.  81 year old female history of hypertension, diabetes, presented with left-sided chest pain and shortness of breath.  Patient seen and examined, vital signs stable Gen.: Without well-nourished Cardiovascular: Normal S1-S2, regular rate and rhythm, no murmurs Respiratory clear to auscultation bilaterally Extremities no clubbing seen a surgeon  Assessment and plan Chest pain -Patient did have left bundle branch block with mild elevation in troponin -Continue to cycle troponin -Cardiology consulted and appreciated, plans for cardiac catheterization today -Continue heparin  Diabetes mellitus, type II -Continue current regimen, hold metformin  Mural thrombus -Etiology unknown -Seen by vascular surgery who recommended discontinuing heparin, continue aspirin, and felt this to be a normal variant of atherosclerosis, low risk of embolization, and signed out   Time spent: 20 minutes  Zyona Pettaway D.O. [facility] Pager [phone]  If 7PM-7AM, please contact night-coverage [email or web address] Password [facility] 04/23/2014, 12:41 PM

## 2014-04-24 ENCOUNTER — Encounter (HOSPITAL_COMMUNITY)
Admission: EM | Disposition: A | Discharge status: Home / Self Care | Payer: Self-pay | Source: Home / Self Care | Attending: Internal Medicine

## 2014-04-24 ENCOUNTER — Encounter (HOSPITAL_COMMUNITY): Payer: Self-pay | Admitting: Cardiovascular Disease

## 2014-04-24 DIAGNOSIS — I200 Unstable angina: Secondary | ICD-10-CM

## 2014-04-24 HISTORY — PX: LEFT HEART CATHETERIZATION WITH CORONARY ANGIOGRAM: SHX5451

## 2014-04-24 LAB — HEPARIN LEVEL (UNFRACTIONATED): HEPARIN UNFRACTIONATED: 0.5 [IU]/mL (ref 0.30–0.70)

## 2014-04-24 LAB — CBC WITH DIFFERENTIAL/PLATELET
BASOS PCT: 0 % (ref 0–1)
Basophils Absolute: 0 10*3/uL (ref 0.0–0.1)
EOS ABS: 0.2 10*3/uL (ref 0.0–0.7)
EOS PCT: 3 % (ref 0–5)
HEMATOCRIT: 35.1 % — AB (ref 36.0–46.0)
Hemoglobin: 11.8 g/dL — ABNORMAL LOW (ref 12.0–15.0)
Lymphocytes Relative: 33 % (ref 12–46)
Lymphs Abs: 2.8 10*3/uL (ref 0.7–4.0)
MCH: 29.7 pg (ref 26.0–34.0)
MCHC: 33.6 g/dL (ref 30.0–36.0)
MCV: 88.4 fL (ref 78.0–100.0)
Monocytes Absolute: 1 10*3/uL (ref 0.1–1.0)
Monocytes Relative: 12 % (ref 3–12)
NEUTROS PCT: 52 % (ref 43–77)
Neutro Abs: 4.4 10*3/uL (ref 1.7–7.7)
Platelets: 333 10*3/uL (ref 150–400)
RBC: 3.97 MIL/uL (ref 3.87–5.11)
RDW: 13 % (ref 11.5–15.5)
WBC: 8.5 10*3/uL (ref 4.0–10.5)

## 2014-04-24 LAB — GLUCOSE, CAPILLARY
GLUCOSE-CAPILLARY: 161 mg/dL — AB (ref 70–99)
Glucose-Capillary: 149 mg/dL — ABNORMAL HIGH (ref 70–99)
Glucose-Capillary: 151 mg/dL — ABNORMAL HIGH (ref 70–99)
Glucose-Capillary: 118 mg/dL — ABNORMAL HIGH (ref 70–99)
Glucose-Capillary: 125 mg/dL — ABNORMAL HIGH (ref 70–99)

## 2014-04-24 LAB — COMPREHENSIVE METABOLIC PANEL
ALBUMIN: 3.1 g/dL — AB (ref 3.5–5.2)
ALK PHOS: 56 U/L (ref 39–117)
ALT: 11 U/L (ref 0–35)
AST: 18 U/L (ref 0–37)
Anion gap: 9 (ref 5–15)
BUN: 9 mg/dL (ref 6–23)
CO2: 22 mmol/L (ref 19–32)
Calcium: 8.8 mg/dL (ref 8.4–10.5)
Chloride: 106 mmol/L (ref 96–112)
Creatinine, Ser: 0.93 mg/dL (ref 0.50–1.10)
GFR calc non Af Amer: 57 mL/min — ABNORMAL LOW (ref >90)
GFR, EST AFRICAN AMERICAN: 66 mL/min — AB (ref >90)
Glucose, Bld: 164 mg/dL — ABNORMAL HIGH (ref 70–99)
POTASSIUM: 4 mmol/L (ref 3.5–5.1)
SODIUM: 137 mmol/L (ref 135–145)
Total Bilirubin: 0.5 mg/dL (ref 0.3–1.2)
Total Protein: 6.1 g/dL (ref 6.0–8.3)

## 2014-04-24 LAB — HEMOGLOBIN A1C
HEMOGLOBIN A1C: 8.2 % — AB (ref 4.8–5.6)
Mean Plasma Glucose: 189 mg/dL

## 2014-04-24 LAB — MRSA PCR SCREENING: MRSA by PCR: NEGATIVE

## 2014-04-24 LAB — HIV ANTIBODY (ROUTINE TESTING W REFLEX): HIV SCREEN 4TH GENERATION: NONREACTIVE

## 2014-04-24 SURGERY — LEFT HEART CATHETERIZATION WITH CORONARY ANGIOGRAM

## 2014-04-24 MED ORDER — LIDOCAINE HCL (PF) 1 % IJ SOLN
INTRAMUSCULAR | Status: AC
  Filled 2014-04-24: qty 30

## 2014-04-24 MED ORDER — VERAPAMIL HCL 2.5 MG/ML IV SOLN
INTRAVENOUS | Status: AC
  Filled 2014-04-24: qty 2

## 2014-04-24 MED ORDER — HEPARIN SODIUM (PORCINE) 5000 UNIT/ML IJ SOLN
5000.0000 [IU] | Freq: Three times a day (TID) | INTRAMUSCULAR | Status: DC
  Administered 2014-04-25: 5000 [IU] via SUBCUTANEOUS
  Filled 2014-04-24 (×3): qty 1

## 2014-04-24 MED ORDER — SODIUM CHLORIDE 0.9 % IV SOLN: INTRAVENOUS | Status: AC

## 2014-04-24 MED ORDER — NITROGLYCERIN 1 MG/10 ML FOR IR/CATH LAB
INTRA_ARTERIAL | Status: AC
  Filled 2014-04-24: qty 10

## 2014-04-24 MED ORDER — GI COCKTAIL ~~LOC~~
30.0000 mL | Freq: Two times a day (BID) | ORAL | Status: DC | PRN
  Filled 2014-04-24: qty 30

## 2014-04-24 MED ORDER — HEPARIN SODIUM (PORCINE) 1000 UNIT/ML IJ SOLN
INTRAMUSCULAR | Status: AC
  Filled 2014-04-24: qty 1

## 2014-04-24 MED ORDER — MIDAZOLAM HCL 2 MG/2ML IJ SOLN
INTRAMUSCULAR | Status: AC
  Filled 2014-04-24: qty 2

## 2014-04-24 MED ORDER — CARVEDILOL 3.125 MG PO TABS
3.1250 mg | ORAL_TABLET | Freq: Two times a day (BID) | ORAL | Status: DC
  Administered 2014-04-24 – 2014-04-25 (×2): 3.125 mg via ORAL
  Filled 2014-04-24 (×4): qty 1

## 2014-04-24 MED ORDER — PANTOPRAZOLE SODIUM 40 MG PO TBEC
40.0000 mg | DELAYED_RELEASE_TABLET | Freq: Every day | ORAL | Status: DC
  Administered 2014-04-24 – 2014-04-25 (×2): 40 mg via ORAL
  Filled 2014-04-24 (×2): qty 1

## 2014-04-24 MED ORDER — HEPARIN (PORCINE) IN NACL 2-0.9 UNIT/ML-% IJ SOLN
INTRAMUSCULAR | Status: AC
  Filled 2014-04-24: qty 2000

## 2014-04-24 NOTE — H&P (View-Only) (Signed)
CONSULT NOTE  Date: 04/23/2014               Patient Name:  Kayla Medina MRN: [id number]  DOB: April 15, 1932 Age / Sex: 81 y.o., female        PCP: Marton Redwood Primary Cardiologist: New/Greyson Peavy            Referring Physician: Brigitte Pulse              Reason for Consult: Chest pain , + troponin            History of Present Illness: Patient is a 81 y.o. female with a PMHx of HTN, DM, , who was admitted to [facility] on 04/22/2014 for evaluation of  Chest discomfort.   Patient has had flu like symptoms for the past week or so .  Fever, malaise, cough.   2 nights ago she had  chest heaviness and chest  pressure. The symptoms lasted for several hours. She took a sleeping pill and eventually went to sleep. The pain had resolved by morning.  She now has recurrent chest pain every time she gets up and walks around. The pain typically last for several minutes. This associated with generalized weakness and shortness of breath. It typically resolves when she stops and rest. She does not get any regular exercise. She does minimal housework.    Medications: Outpatient medications: Prescriptions prior to admission  Medication Sig Dispense Refill Last Dose  . ALPRAZolam (XANAX) 0.5 MG tablet Take 0.5 mg by mouth at bedtime as needed for anxiety.   04/21/2014 at Unknown time  . cholecalciferol (VITAMIN D) 1000 UNITS tablet Take 1,000 Units by mouth daily.   04/22/2014 at Unknown time  . Coenzyme Q10 (CO Q-10) 30 MG CAPS Take 30 mg by mouth daily.    04/22/2014 at Unknown time  . desonide (DESOWEN) 0.05 % cream Apply 1 application topically 2 (two) times daily as needed (for itching/dry skin).   04/22/2014 at Unknown time  . guaiFENesin (MUCINEX) 600 MG 12 hr tablet Take 1 tablet (600 mg total) by mouth 2 (two) times daily as needed for cough or to loosen phlegm.   04/22/2014 at Unknown time  . losartan (COZAAR) 100 MG tablet Take 100 mg by mouth 2 (two) times daily.    04/22/2014 at Unknown time  . meclizine  (ANTIVERT) 25 MG tablet Take 1 tablet (25 mg total) by mouth 3 (three) times daily as needed for dizziness. 30 tablet 0 Past Week at Unknown time  . metFORMIN (GLUCOPHAGE) 1000 MG tablet Take 1,000 mg by mouth 2 (two) times daily.    04/22/2014 at Unknown time  . vitamin B-12 (CYANOCOBALAMIN) 1000 MCG tablet Take 1,000 mcg by mouth daily.   Past Week at Unknown time  . vitamin C (ASCORBIC ACID) 500 MG tablet Take 500 mg by mouth daily.   04/22/2014 at Unknown time  . vitamin E 600 UNIT capsule Take 400 Units by mouth daily.    04/22/2014 at Unknown time  . fluconazole (DIFLUCAN) 100 MG tablet 1 tablet daily for 7 days (Patient not taking: Reported on 04/22/2014) 7 tablet 0 Completed Course at Unknown time  . montelukast (SINGULAIR) 10 MG tablet Take 10 mg by mouth at bedtime.    Not Taking at Unknown time    Current medications: Current Facility-Administered Medications  Medication Dose Route Frequency Provider Last Rate Last Dose  . ALPRAZolam Duanne Moron) tablet 0.5 mg  0.5 mg Oral QHS PRN Shanda Howells,  MD      . aspirin EC tablet 81 mg  81 mg Oral Daily Shanda Howells, MD      . heparin ADULT infusion 100 units/mL (25000 units/250 mL)  1,000 Units/hr Intravenous Continuous Orpah Greek, MD 10 mL/hr at 04/23/14 0122 1,000 Units/hr at 04/23/14 0122  . insulin aspart (novoLOG) injection 0-9 Units  0-9 Units Subcutaneous 6 times per day Shanda Howells, MD   2 Units at 04/23/14 0818  . [START ON 04/24/2014] levofloxacin (LEVAQUIN) IVPB 750 mg  750 mg Intravenous Q48H Rogue Bussing, [facility]      . losartan (COZAAR) tablet 100 mg  100 mg Oral BID Shanda Howells, MD   100 mg at 04/23/14 0115  . morphine 2 MG/ML injection 2-4 mg  2-4 mg Intravenous Q2H PRN Shanda Howells, MD   2 mg at 04/23/14 0523  . ondansetron (ZOFRAN) tablet 4 mg  4 mg Oral Q6H PRN Shanda Howells, MD       Or  . ondansetron [facility]) injection 4 mg  4 mg Intravenous Q6H PRN Shanda Howells, MD      . sodium chloride 0.9 % injection 3 mL   3 mL Intravenous Q12H Shanda Howells, MD   3 mL at 04/23/14 0428     Allergies  Allergen Reactions  . Codeine Nausea And Vomiting  . Januvia [Sitagliptin] Nausea And Vomiting  . Other Nausea And Vomiting    *all narcotic meds*  . Penicillins Hives  . Sulfonamide Derivatives Hives     Past Medical History  Diagnosis Date  . Chest pain, unspecified   . Other and unspecified hyperlipidemia   . Shortness of breath   . Palpitations   . Shingles     hx  . Diabetes mellitus without complication   . Hypertension   . Rosacea   . Anal fissure   . Pneumonia   . PVC (premature ventricular contraction)   . Glaucoma     pt. states no-glaucoma 07/20/13  . Subclinical hypothyroidism     Past Surgical History  Procedure Laterality Date  . Bladder repair    . Bowel resection    . Bilateral eye surgery/cataract repair  3/09  . Total abdominal hysterectomy    . Appendectomy      Family History  Problem Relation Age of Onset  . Alzheimer's disease Mother   . Hypertension Mother   . Thyroid disease Mother   . Alcoholism Father   . Prostate cancer Brother   . Seizures Sister   . Diabetes Maternal Grandmother   . Diabetes Daughter     x 2  . Diabetes Paternal Grandmother     entire family  . Thyroid disease Brother     x2   . Thyroid disease Daughter   . Thyroid disease Son   . Other Sister     meningitis    Social History:  reports that she has never smoked. She has never used smokeless tobacco. She reports that she does not drink alcohol or use illicit drugs.   Review of Systems: Constitutional:  admits to fever, chills, diaphoresis, appetite change and fatigue several weeks ago when she had the flu  HEENT: denies photophobia, eye pain, redness, hearing loss, ear pain, congestion, sore throat, rhinorrhea, sneezing, neck pain, neck stiffness and tinnitus.  Respiratory: admits to SOB, DOE, cough, chest tightness, and wheezing.  Cardiovascular: admits to chest pain,  palpitations and leg swelling.  Gastrointestinal: denies nausea, vomiting, abdominal pain, diarrhea, constipation, blood in  stool.  Genitourinary: denies dysuria, urgency, frequency, hematuria, flank pain and difficulty urinating.  Musculoskeletal: denies  myalgias, back pain, joint swelling, arthralgias and gait problem.   Skin: denies pallor, rash and wound.  Neurological: denies dizziness, seizures, syncope, weakness, light-headedness, numbness and headaches.   Hematological: denies adenopathy, easy bruising, personal or family bleeding history.  Psychiatric/ Behavioral: denies suicidal ideation, mood changes, confusion, nervousness, sleep disturbance and agitation.    Physical Exam: BP 138/67 mmHg  Pulse 77  Temp(Src) 97.7 F (36.5 C) (Oral)  Resp 18  Ht 5\' 6"  (1.676 m)  Wt 160 lb (72.576 kg)  BMI 25.84 kg/m2  SpO2 99%  Wt Readings from Last 3 Encounters:  04/23/14 160 lb (72.576 kg)  07/20/13 162 lb (73.483 kg)  06/14/13 162 lb (73.483 kg)    General: Vital signs reviewed and noted. Well-developed, well-nourished, in no acute distress; alert,   Head: Normocephalic, atraumatic, sclera anicteric,   Neck: Supple. Negative for carotid bruits. No JVD   Lungs:  Clear bilaterally, no  wheezes, rales, or rhonchi. Breathing is normal   Heart: RRR with S1 S2. No murmurs, rubs, or gallops   Abdomen/ GI :  Soft, non-tender, non-distended with normoactive bowel sounds. No hepatomegaly. No rebound/guarding. No obvious abdominal masses   MSK: Strength and the appear normal for age.   Extremities: No clubbing or cyanosis. No edema.  Distal pedal pulses are 2+ and equal   Neurologic:  CN are grossly intact,  No obvious motor or sensory defect.  Alert and oriented X 3. Moves all extremities spontaneously.  Psych: Responds to questions appropriately with a normal affect.     Lab results: Basic Metabolic Panel:  Recent Labs Lab 04/22/14 1830 04/23/14 0610  NA 137 136  K 3.9 3.7  CL  108 104  CO2 21 23  GLUCOSE 286* 193*  BUN 16 11  CREATININE 1.00 0.90  CALCIUM 8.9 8.5    Liver Function Tests:  Recent Labs Lab 04/23/14 0610  AST 22  ALT 13  ALKPHOS 65  BILITOT 0.3  PROT 6.3  ALBUMIN 3.1*   No results for input(s): LIPASE, AMYLASE in the last 168 hours. No results for input(s): AMMONIA in the last 168 hours.  CBC:  Recent Labs Lab 04/22/14 1830 04/23/14 0610  WBC 8.1 8.8  NEUTROABS  --  4.1  HGB 12.1 11.5*  HCT 35.9* 34.6*  MCV 88.6 88.7  PLT 362 367    Cardiac Enzymes:  Recent Labs Lab 04/23/14 0045 04/23/14 0610  TROPONINI 0.11* 0.11*    BNP: Invalid input(s): POCBNP  CBG:  Recent Labs Lab 04/22/14 2147 04/23/14 0103 04/23/14 0400 04/23/14 0805  GLUCAP 139* 150* 175* 183*    Coagulation Studies: No results for input(s): LABPROT, INR in the last 72 hours.   Other results: Personal review of EKG shows :  -NSR, new LBBB. The LBBB is new since her previous ECG   Imaging: Dg Chest 2 View  04/22/2014   CLINICAL DATA:  Chest pain and tightness. Left arm pain. Cough. Shortness of breath.  EXAM: CHEST  2 VIEW  COMPARISON:  None.  FINDINGS: Midline trachea. Normal heart size. Atherosclerosis in the transverse aorta. No pleural effusion or pneumothorax. Mild biapical pleural-parenchymal scarring. Greater on the left. Mild volume loss and probable scarring at the left lung base.  IMPRESSION: No acute cardiopulmonary disease.  Aortic atherosclerosis.   Electronically Signed   By: Abigail Miyamoto M.D.   On: 04/22/2014 19:59   Ct Angio  Chest Pe W/cm &/or Wo Cm  04/22/2014   CLINICAL DATA:  Acute onset of sharp mid chest pain for several days and left arm pain. Recent cough. Initial encounter.  EXAM: CT ANGIOGRAPHY CHEST WITH CONTRAST  TECHNIQUE: Multidetector CT imaging of the chest was performed using the standard protocol during bolus administration of intravenous contrast. Multiplanar CT image reconstructions and MIPs were obtained to  evaluate the vascular anatomy.  CONTRAST:  80 mL of Omnipaque 350 IV contrast  COMPARISON:  Chest radiograph performed earlier today at 7:15 p.m.  FINDINGS: There is no evidence of pulmonary embolus.  Mild bilateral dependent subsegmental atelectasis is noted, with minimal associated nonspecific nodularity. Additional relatively diffuse minimal peripheral nodularity is noted within the upper lung lobes. This could reflect a mild atypical infectious process. There is no evidence of significant focal consolidation, pleural effusion or pneumothorax. No masses are identified; no abnormal focal contrast enhancement is seen.  Scattered mediastinal nodes remain normal in size, without evidence of mediastinal lymphadenopathy. No pericardial effusion is identified. Mild mural thrombus and calcification is noted at the origin of the left subclavian artery, with less than 50% luminal narrowing. The great vessels are otherwise grossly unremarkable. No axillary lymphadenopathy is seen. The thyroid gland is unremarkable in appearance.  Nonspecific vaguely decreased attenuation is noted within the liver near the hepatic hilum. The visualized portions of the spleen are unremarkable.  No acute osseous abnormalities are seen.  Review of the MIP images confirms the above findings.  IMPRESSION: 1. No evidence of pulmonary embolus. 2. Mild bilateral dependent subsegmental atelectasis, with minimal associated nonspecific nodularity. Additional relatively diffuse minimal peripheral nodularity within the upper lung lobes. This could reflect a mild atypical infectious process. 3. Mild mural thrombus and calcification at the origin of the left subclavian artery, with less than 50% luminal narrowing. 4. Nonspecific vaguely decreased attenuation within the liver near the hepatic hilum. This distribution is somewhat unusual for fatty infiltration, though incompletely visualized. Would correlate with LFTs and consider dynamic liver protocol MRI  or CT for further evaluation.   Electronically Signed   By: Garald Balding M.D.   On: 04/22/2014 22:32       Assessment & Plan:  1. Unstable angina: Patient presents with symptoms that are consistent with unstable angina. She has chest heaviness and chest pressure associated with profound weakness and a "sick feeling".    This is associated with a mildly elevated troponin level and a new left bundle-branch block. Given all of this information, I think that she needs a cardiac catheterization. We discussed the risks, benefits, and options of Cardec cavitation. She understands and agrees to proceed. I discussed this also with her son who is present during the exam and interview.  Continue heparin. We will schedule her for cardiac Rosacea later today. We will hold her metformin.  2. Diabetes mellitus: Her hemoglobin A1c levels have been mildly elevated. Further plans per Dr. Brigitte Pulse.  3. Hypertension: Stable   Thayer Headings, Brooke Bonito., MD, [facility] 04/23/2014, 9:12 AM Office - [phone] Pager [phone]

## 2014-04-24 NOTE — Progress Notes (Signed)
ANTICOAGULATION CONSULT NOTE - Initial Consult  Pharmacy Consult for heparin  Indication: ACS  Allergies  Allergen Reactions  . Codeine Nausea And Vomiting  . Januvia [Sitagliptin] Nausea And Vomiting  . Other Nausea And Vomiting    *all narcotic meds*  . Penicillins Hives  . Sulfonamide Derivatives Hives    Patient Measurements: Height: 5\' 6"  (167.6 cm) Weight: 160 lb 12.8 oz (72.938 kg) IBW/kg (Calculated) : 59.3 Heparin Dosing Weight: 72.5 kg  Vital Signs: Temp: 98.2 F (36.8 C) (02/03 1011) Temp Source: Oral (02/03 1011) BP: 142/73 mmHg (02/03 1011) Pulse Rate: 66 (02/03 1011)  Labs:  Recent Labs  04/22/14 1830 04/23/14 0045 04/23/14 0610 04/23/14 0935 04/23/14 1257 04/23/14 1634 04/24/14 0405  HGB 12.1  --  11.5*  --   --   --  11.8*  HCT 35.9*  --  34.6*  --   --   --  35.1*  PLT 362  --  367  --   --   --  333  LABPROT  --   --   --   --   --  13.4  --   INR  --   --   --   --   --  1.01  --   HEPARINUNFRC  --   --   --  0.56  --  0.39 0.50  CREATININE 1.00  --  0.90  --   --   --  0.93  TROPONINI  --  0.11* 0.11*  --  0.05*  --   --     Estimated Creatinine Clearance: 50.1 mL/min (by C-G formula based on Cr of 0.93).   Medical History: Past Medical History  Diagnosis Date  . Chest pain, unspecified   . Other and unspecified hyperlipidemia   . Shortness of breath   . Palpitations   . Shingles     hx  . Diabetes mellitus without complication   . Hypertension   . Rosacea   . Anal fissure   . Pneumonia   . PVC (premature ventricular contraction)   . Glaucoma     pt. states no-glaucoma 07/20/13  . Subclinical hypothyroidism     Medications:  No oral anticoags PTA  Assessment: 81 yo with intermittent CP for 2-3 days, started on IV heparin. Heparin level 0.5 is therapeutic on 1050 units/hr. Cardiology consulted, plan for cath later today. Hgb 11.8, plt 333k stable. No bleeding noted per chart.  Goal of Therapy:  Heparin level 0.3-0.7  units/ml Monitor platelets by anticoagulation protocol: Yes   Plan:  Continue Heparin 1050 units/hr.  F/u after cath.  Maryanna Shape, PharmD, BCPS  Clinical Pharmacist  Pager: [phone]   04/24/2014,10:43 AM

## 2014-04-24 NOTE — Progress Notes (Signed)
TR band removed per protocol; site clean and intact; level 0. So active bleeding, hematoma or bruising noted. dsg applied per protocol. Vitals taken; Will continue to monitor pt quietly. Francis Gaines Imre Vecchione RN.

## 2014-04-24 NOTE — CV Procedure (Signed)
   Cardiac Catheterization Procedure Note  Name: Kayla Medina MRN: [id number] DOB: [birth year]  Procedure: Left Heart Cath, Selective Coronary Angiography, LV angiography  Indication:  Unstable angina  Medications:  Sedation:  1 mg IV Versed, 0 mcg IV Fentanyl  Contrast:  60 mL Omnipaque   Procedural Details: The right wrist was prepped, draped, and anesthetized with 1% lidocaine. Using the modified Seldinger technique, a 5 French sheath was introduced into the right radial artery. 3 mg of verapamil was administered through the sheath, weight-based unfractionated heparin was administered intravenously. A Jackie catheter was used for selective coronary angiography. A pigtail catheter was used for left ventriculography. Catheter exchanges were performed over an exchange length guidewire. There were no immediate procedural complications. A TR band was used for radial hemostasis at the completion of the procedure.  The patient was transferred to the post catheterization recovery area for further monitoring.  Procedural Findings:  Hemodynamics: AO:  163/74   mmHg LV:  165/4    mmHg LVEDP: 19  mmHg  Coronary angiography: Coronary dominance: right    Left Main:  Normal  Left Anterior Descending (LAD):  Normal in size with minor irregularities.  1st diagonal (D1):  Very large in size with minor irregularities. This supplies second and third diagonal distribution.   Circumflex (LCx):   Normal in size and nondominant. There is a 20% stenosis in the midsegment.  1st obtuse marginal:  Small in size with minor irregularities.  2nd obtuse marginal:  Medium in size with minor irregularities.  3rd obtuse marginal:  Normal in size with no significant disease.    Right Coronary Artery: Normal in size and dominant. The vessel has no significant disease.  Posterior descending artery: Normal normal  Posterior AV segment: Normal  Posterolateral branchs:  2 posterolateral branches which are  normal  Left ventriculography: Left ventricular systolic function is mildly to moderately reduced , LVEF is estimated at 40 %, there is no significant mitral regurgitation . There is severe hypokinesis of the distal anterior wall, apical and distal inferior wall  Final Conclusions:   1. No evidence of obstructive coronary artery disease. 2. Mild to moderately reduced LV systolic function with left ventricular angiography suggestive of stress-induced cardiomyopathy. 3. Mildly elevated left ventricular end-diastolic pressure.  Recommendations:  Continue medical therapy. I added small dose Carvedilol.   Kathlyn Sacramento MD, [facility] 04/24/2014, 11:17 AM

## 2014-04-24 NOTE — Interval H&P Note (Signed)
History and Physical Interval Note:  04/24/2014 10:47 AM  Kayla Medina  has presented today for surgery, with the diagnosis of cp  The various methods of treatment have been discussed with the patient and family. After consideration of risks, benefits and other options for treatment, the patient has consented to  Procedure(s): LEFT HEART CATHETERIZATION WITH CORONARY ANGIOGRAM (N/A) as a surgical intervention .  The patient's history has been reviewed, patient examined, no change in status, stable for surgery.  I have reviewed the patient's chart and labs.  Questions were answered to the patient's satisfaction.     Kathlyn Sacramento

## 2014-04-24 NOTE — Progress Notes (Signed)
Pt transported off to cath lab. Francis Gaines Shardee Dieu RN.

## 2014-04-24 NOTE — Progress Notes (Signed)
Patient Demographics  Kayla Medina, is a 81 y.o. female, DOB - 1933-02-08, WJX:[id number]  Admit date - 04/22/2014   Admitting Physician Shanda Howells, MD  Outpatient Primary MD for the patient is Marton Redwood, MD  LOS - 2   Chief Complaint  Patient presents with  . Chest Pain      Admission history of present illness/brief narrative: This is a 81 y.o. year old female with significant past medical history of HTN, type 2 DM, PVCs presenting with chest pain, dyspnea, subclavian mural thrombus. Resents with intermittent chest pain over the last 2 weeks , patient to troponins were mildly elevated at 0.112, with new left bundle branch block, she had cardiac cath by Dr. Adair Laundry on 04/24/14, without evidence of significant obstructive coronary artery disease ,folllow-up CT angiogram is negative for PE however does show mild bilateral subsegmental atelectasis-? Atypical infectious processes. Patient also with a positive mild mural thrombus and calcification at the margin of the left subclavian artery with less than 50% luminal narrowing. Patient was issued on heparin drip, as seen by vascular surgery who recommended to stop heparin drip, and to continue patient on aspirin.  Subjective:   Kayla Medina today has, No headache, No chest pain, No abdominal pain - No Nausea, No new weakness tingling or numbness, No Cough - SOB.   Assessment & Plan    Active Problems:   Chest pain   Dyspnea   Obstructive thrombus  Chest pain, left bundle branch block, elevated troponins - Cardiac cath on 04/24/14, showing no evidence of obstructive coronary artery disease. - Chest pain most likely related to epigastric origin, will start on PPI, and GI cocktail.   Left subclavian stenosis with Mural thrombus - Vascular surgery input appreciated, this can be normal variant of atherosclerosis -  Continue with aspirin on discharge - Off heparin drip  Diabetes mellitus - Continue with insulin sliding scale  Hypertension - Blood pressure acceptable, continue with home medication.  Chronic systolic CHF - EF is 21% by left ventriculography - Continue with aspirin and losartan   Code Status: Full  Family Communication: None at bedside  Disposition Plan: Home in a.m.   Procedures  Cardiac cath 2/3   Consults   Cardiology Vascular surgery   Medications  Scheduled Meds: . aspirin EC  81 mg Oral Daily  . carvedilol  3.125 mg Oral BID WC  . guaiFENesin  1,200 mg Oral BID  . insulin aspart  0-9 Units Subcutaneous 6 times per day  . levofloxacin (LEVAQUIN) IV  750 mg Intravenous Q48H  . losartan  100 mg Oral BID  . pantoprazole  40 mg Oral Daily  . sodium chloride  3 mL Intravenous Q12H   Continuous Infusions: . sodium chloride     PRN Meds:.ALPRAZolam, benzonatate, gi cocktail, morphine injection, ondansetron **OR** ondansetron (ZOFRAN) IV  DVT Prophylaxis  Heparin [place]  Lab Results  Component Value Date   PLT 333 04/24/2014    Antibiotics   Anti-infectives    Start     Dose/Rate Route Frequency Ordered Stop   04/24/14 0000  levofloxacin (LEVAQUIN) IVPB 750 mg     750 mg100 mL/hr over 90 Minutes Intravenous Every 48 hours 04/23/14 0156 04/29/14 2359   04/23/14 0045  ceFEPIme (MAXIPIME) 1 g in dextrose 5 % 50 mL IVPB  Status:  Discontinued     1 g100 mL/hr over 30 Minutes Intravenous  Once 04/23/14 0032 04/23/14 0039   04/23/14 0045  levofloxacin (LEVAQUIN) IVPB 750 mg  Status:  Discontinued     750 mg100 mL/hr over 90 Minutes Intravenous Every 24 hours 04/23/14 0039 04/23/14 0155   04/23/14 0030  vancomycin (VANCOCIN) 1,500 mg in sodium chloride 0.9 % 500 mL IVPB  Status:  Discontinued     1,500 mg250 mL/hr over 120 Minutes Intravenous  Once 04/23/14 0032 04/23/14 0039          Objective:   Filed Vitals:   04/24/14 1300 04/24/14 1340 04/24/14 1410  04/24/14 1433  BP: 137/57 134/56 140/65 134/57  Pulse: 73 70 75 69  Temp: 97.9 F (36.6 C)     TempSrc: Oral     Resp: 18 18 18 18   Height:      Weight:      SpO2: 97% 99% 99% 98%    Wt Readings from Last 3 Encounters:  04/24/14 72.938 kg (160 lb 12.8 oz)  07/20/13 73.483 kg (162 lb)  06/14/13 73.483 kg (162 lb)     Intake/Output Summary (Last 24 hours) at 04/24/14 1436 Last data filed at 04/24/14 1222  Gross per 24 hour  Intake      3 ml  Output    350 ml  Net   -347 ml     Physical Exam  Awake Alert, Oriented X 3, No new F.N deficits, Normal affect Kayla Medina.AT,PERRAL Supple Neck,No JVD, No cervical lymphadenopathy appriciated.  Symmetrical Chest wall movement, Good air movement bilaterally, CTAB RRR,No Gallops,Rubs or new Murmurs, No Parasternal Heave +ve B.Sounds, Abd Soft, No tenderness, No organomegaly appriciated, No rebound - guarding or rigidity. No Cyanosis, Clubbing or edema, No new Rash or bruise     Data Review   Micro Results Recent Results (from the past 240 hour(s))  Blood culture (routine x 2)     Status: None (Preliminary result)   Collection Time: 04/23/14 12:48 AM  Result Value Ref Range Status   Specimen Description BLOOD LEFT HAND  Final   Special Requests BOTTLES DRAWN AEROBIC AND ANAEROBIC 5CC EA  Final   Culture   Final           BLOOD CULTURE RECEIVED NO GROWTH TO DATE CULTURE WILL BE HELD FOR 5 DAYS BEFORE ISSUING A FINAL NEGATIVE REPORT Performed at [facility]    Report Status PENDING  Incomplete  Blood culture (routine x 2)     Status: None (Preliminary result)   Collection Time: 04/23/14 12:50 AM  Result Value Ref Range Status   Specimen Description BLOOD LEFT FOREARM  Final   Special Requests BOTTLES DRAWN AEROBIC AND ANAEROBIC 5CC EA  Final   Culture   Final           BLOOD CULTURE RECEIVED NO GROWTH TO DATE CULTURE WILL BE HELD FOR 5 DAYS BEFORE ISSUING A FINAL NEGATIVE REPORT Performed at [facility]    Report  Status PENDING  Incomplete  MRSA PCR Screening     Status: None   Collection Time: 04/24/14  2:46 AM  Result Value Ref Range Status   MRSA by PCR NEGATIVE NEGATIVE Final    Comment:        The GeneXpert MRSA Assay (FDA approved for NASAL specimens only), is one component of a comprehensive MRSA colonization surveillance program. It is  not intended to diagnose MRSA infection nor to guide or monitor treatment for MRSA infections.     Radiology Reports Dg Chest 2 View  04/22/2014   CLINICAL DATA:  Chest pain and tightness. Left arm pain. Cough. Shortness of breath.  EXAM: CHEST  2 VIEW  COMPARISON:  None.  FINDINGS: Midline trachea. Normal heart size. Atherosclerosis in the transverse aorta. No pleural effusion or pneumothorax. Mild biapical pleural-parenchymal scarring. Greater on the left. Mild volume loss and probable scarring at the left lung base.  IMPRESSION: No acute cardiopulmonary disease.  Aortic atherosclerosis.   Electronically Signed   By: Abigail Miyamoto M.D.   On: 04/22/2014 19:59   Ct Angio Chest Pe W/cm &/or Wo Cm  04/22/2014   CLINICAL DATA:  Acute onset of sharp mid chest pain for several days and left arm pain. Recent cough. Initial encounter.  EXAM: CT ANGIOGRAPHY CHEST WITH CONTRAST  TECHNIQUE: Multidetector CT imaging of the chest was performed using the standard protocol during bolus administration of intravenous contrast. Multiplanar CT image reconstructions and MIPs were obtained to evaluate the vascular anatomy.  CONTRAST:  80 mL of Omnipaque 350 IV contrast  COMPARISON:  Chest radiograph performed earlier today at 7:15 p.m.  FINDINGS: There is no evidence of pulmonary embolus.  Mild bilateral dependent subsegmental atelectasis is noted, with minimal associated nonspecific nodularity. Additional relatively diffuse minimal peripheral nodularity is noted within the upper lung lobes. This could reflect a mild atypical infectious process. There is no evidence of significant  focal consolidation, pleural effusion or pneumothorax. No masses are identified; no abnormal focal contrast enhancement is seen.  Scattered mediastinal nodes remain normal in size, without evidence of mediastinal lymphadenopathy. No pericardial effusion is identified. Mild mural thrombus and calcification is noted at the origin of the left subclavian artery, with less than 50% luminal narrowing. The great vessels are otherwise grossly unremarkable. No axillary lymphadenopathy is seen. The thyroid gland is unremarkable in appearance.  Nonspecific vaguely decreased attenuation is noted within the liver near the hepatic hilum. The visualized portions of the spleen are unremarkable.  No acute osseous abnormalities are seen.  Review of the MIP images confirms the above findings.  IMPRESSION: 1. No evidence of pulmonary embolus. 2. Mild bilateral dependent subsegmental atelectasis, with minimal associated nonspecific nodularity. Additional relatively diffuse minimal peripheral nodularity within the upper lung lobes. This could reflect a mild atypical infectious process. 3. Mild mural thrombus and calcification at the origin of the left subclavian artery, with less than 50% luminal narrowing. 4. Nonspecific vaguely decreased attenuation within the liver near the hepatic hilum. This distribution is somewhat unusual for fatty infiltration, though incompletely visualized. Would correlate with LFTs and consider dynamic liver protocol MRI or CT for further evaluation.   Electronically Signed   By: Garald Balding M.D.   On: 04/22/2014 22:32    CBC  Recent Labs Lab 04/22/14 1830 04/23/14 0610 04/24/14 0405  WBC 8.1 8.8 8.5  HGB 12.1 11.5* 11.8*  HCT 35.9* 34.6* 35.1*  PLT 362 367 333  MCV 88.6 88.7 88.4  MCH 29.9 29.5 29.7  MCHC 33.7 33.2 33.6  RDW 13.0 13.0 13.0  LYMPHSABS  --  3.4 2.8  MONOABS  --  1.0 1.0  EOSABS  --  0.2 0.2  BASOSABS  --  0.0 0.0    Chemistries   Recent Labs Lab 04/22/14 1830  04/23/14 0610 04/24/14 0405  NA 137 136 137  K 3.9 3.7 4.0  CL 108 104 106  CO2 21 23 22   GLUCOSE 286* 193* 164*  BUN 16 11 9   CREATININE 1.00 0.90 0.93  CALCIUM 8.9 8.5 8.8  AST  --  22 18  ALT  --  13 11  ALKPHOS  --  65 56  BILITOT  --  0.3 0.5   ------------------------------------------------------------------------------------------------------------------ estimated creatinine clearance is 50.1 mL/min (by C-G formula based on Cr of 0.93). ------------------------------------------------------------------------------------------------------------------  Recent Labs  04/23/14 0045  HGBA1C 8.2*   ------------------------------------------------------------------------------------------------------------------  Recent Labs  04/23/14 0045  CHOL 231*  HDL 45  LDLCALC 127*  TRIG 297*  CHOLHDL 5.1   ------------------------------------------------------------------------------------------------------------------  Recent Labs  04/23/14 0045  TSH 5.257*   ------------------------------------------------------------------------------------------------------------------ No results for input(s): VITAMINB12, FOLATE, FERRITIN, TIBC, IRON, RETICCTPCT in the last 72 hours.  Coagulation profile  Recent Labs Lab 04/23/14 1634  INR 1.01     Recent Labs  04/22/14 2012  DDIMER 0.70*    Cardiac Enzymes  Recent Labs Lab 04/23/14 0045 04/23/14 0610 04/23/14 1257  TROPONINI 0.11* 0.11* 0.05*   ------------------------------------------------------------------------------------------------------------------ Invalid input(s): POCBNP     Time Spent in minutes   30 minutes   Mirabel Ahlgren M.D on 04/24/2014 at 2:36 PM  Between 7am to 7pm - Pager - [phone]  After 7pm go to [email or web address] - password [email or web address]  And look for the night coverage person covering for me after hours  [facility] Office  [phone]   **Disclaimer: This note may  have been dictated with voice recognition software. Similar sounding words can inadvertently be transcribed and this note may contain transcription errors which may not have been corrected upon publication of note.**.

## 2014-04-24 NOTE — Progress Notes (Signed)
Pt arrived from cath lab at 1135, vitals taken, placed on telemetry and verified; pt IV remains intact and infusing. TR band intact, level 0, no s/s active bleeding, bruising or hematoma; 12cc or air in TR band per report and can start deflating after 1200. Pt educated on right hand precautions and she voices understanding denying any questions. Pt in bed comfortably with call light within reach. Will continue to monitor quietly. Francis Gaines Taeshaun Rames RN.

## 2014-04-25 DIAGNOSIS — R0789 Other chest pain: Secondary | ICD-10-CM

## 2014-04-25 LAB — COMPREHENSIVE METABOLIC PANEL
ALK PHOS: 51 U/L (ref 39–117)
ALT: 12 U/L (ref 0–35)
AST: 18 U/L (ref 0–37)
Albumin: 3 g/dL — ABNORMAL LOW (ref 3.5–5.2)
Anion gap: 7 (ref 5–15)
BILIRUBIN TOTAL: 0.4 mg/dL (ref 0.3–1.2)
BUN: 12 mg/dL (ref 6–23)
CALCIUM: 8.6 mg/dL (ref 8.4–10.5)
CHLORIDE: 110 mmol/L (ref 96–112)
CO2: 22 mmol/L (ref 19–32)
Creatinine, Ser: 0.89 mg/dL (ref 0.50–1.10)
GFR calc Af Amer: 70 mL/min — ABNORMAL LOW (ref >90)
GFR calc non Af Amer: 60 mL/min — ABNORMAL LOW (ref >90)
Glucose, Bld: 125 mg/dL — ABNORMAL HIGH (ref 70–99)
Potassium: 4.1 mmol/L (ref 3.5–5.1)
SODIUM: 139 mmol/L (ref 135–145)
Total Protein: 5.6 g/dL — ABNORMAL LOW (ref 6.0–8.3)

## 2014-04-25 LAB — CBC WITH DIFFERENTIAL/PLATELET
BASOS ABS: 0 10*3/uL (ref 0.0–0.1)
Basophils Relative: 0 % (ref 0–1)
EOS ABS: 0.3 10*3/uL (ref 0.0–0.7)
EOS PCT: 4 % (ref 0–5)
HEMATOCRIT: 34.5 % — AB (ref 36.0–46.0)
Hemoglobin: 11.4 g/dL — ABNORMAL LOW (ref 12.0–15.0)
LYMPHS PCT: 33 % (ref 12–46)
Lymphs Abs: 2.3 10*3/uL (ref 0.7–4.0)
MCH: 29.5 pg (ref 26.0–34.0)
MCHC: 33 g/dL (ref 30.0–36.0)
MCV: 89.4 fL (ref 78.0–100.0)
MONOS PCT: 15 % — AB (ref 3–12)
Monocytes Absolute: 1.1 10*3/uL — ABNORMAL HIGH (ref 0.1–1.0)
NEUTROS ABS: 3.4 10*3/uL (ref 1.7–7.7)
Neutrophils Relative %: 48 % (ref 43–77)
Platelets: 334 10*3/uL (ref 150–400)
RBC: 3.86 MIL/uL — ABNORMAL LOW (ref 3.87–5.11)
RDW: 13.5 % (ref 11.5–15.5)
WBC: 7.1 10*3/uL (ref 4.0–10.5)

## 2014-04-25 LAB — GLUCOSE, CAPILLARY
GLUCOSE-CAPILLARY: 120 mg/dL — AB (ref 70–99)
Glucose-Capillary: 152 mg/dL — ABNORMAL HIGH (ref 70–99)
Glucose-Capillary: 172 mg/dL — ABNORMAL HIGH (ref 70–99)

## 2014-04-25 MED ORDER — CARVEDILOL 3.125 MG PO TABS: 3.1250 mg | ORAL_TABLET | Freq: Two times a day (BID) | ORAL | Status: DC

## 2014-04-25 MED ORDER — ASPIRIN 81 MG PO TBEC: 81.0000 mg | DELAYED_RELEASE_TABLET | Freq: Every day | ORAL | Status: DC

## 2014-04-25 MED ORDER — PANTOPRAZOLE SODIUM 40 MG PO TBEC: 40.0000 mg | DELAYED_RELEASE_TABLET | Freq: Every day | ORAL | Status: DC

## 2014-04-25 MED ORDER — METFORMIN HCL 1000 MG PO TABS: 1000.0000 mg | ORAL_TABLET | Freq: Two times a day (BID) | ORAL | Status: AC

## 2014-04-25 NOTE — Progress Notes (Signed)
DC IV and tele per MD orders and protocol; DC instructions reviewed with patient; paper prescriptions given to pt; no further questions from patient.  Rowe Pavy, RN

## 2014-04-25 NOTE — Progress Notes (Signed)
TELEMETRY: Reviewed telemetry pt in NSR: Filed Vitals:   04/24/14 1626 04/24/14 1833 04/24/14 1946 04/25/14 0425  BP: 138/58 144/59 131/45 123/57  Pulse: 69 71 72 67  Temp:   98.4 F (36.9 C) 98 F (36.7 C)  TempSrc:   Oral Oral  Resp: 18 18 18 18   Height:      Weight:    160 lb 0.9 oz (72.6 kg)  SpO2: 98%  97% 98%    Intake/Output Summary (Last 24 hours) at 04/25/14 0749 Last data filed at 04/24/14 1222  Gross per 24 hour  Intake      0 ml  Output    350 ml  Net   -350 ml   Filed Weights   04/23/14 0208 04/24/14 0433 04/25/14 0425  Weight: 160 lb (72.576 kg) 160 lb 12.8 oz (72.938 kg) 160 lb 0.9 oz (72.6 kg)    Subjective No complaints today. Ready to go home  . aspirin EC  81 mg Oral Daily  . carvedilol  3.125 mg Oral BID WC  . guaiFENesin  1,200 mg Oral BID  . heparin subcutaneous  5,000 Units Subcutaneous 3 times per day  . insulin aspart  0-9 Units Subcutaneous 6 times per day  . levofloxacin (LEVAQUIN) IV  750 mg Intravenous Q48H  . losartan  100 mg Oral BID  . pantoprazole  40 mg Oral Daily  . sodium chloride  3 mL Intravenous Q12H      LABS: Basic Metabolic Panel:  Recent Labs  04/24/14 0405 04/25/14 0500  NA 137 139  K 4.0 4.1  CL 106 110  CO2 22 22  GLUCOSE 164* 125*  BUN 9 12  CREATININE 0.93 0.89  CALCIUM 8.8 8.6   Liver Function Tests:  Recent Labs  04/24/14 0405 04/25/14 0500  AST 18 18  ALT 11 12  ALKPHOS 56 51  BILITOT 0.5 0.4  PROT 6.1 5.6*  ALBUMIN 3.1* 3.0*   No results for input(s): LIPASE, AMYLASE in the last 72 hours. CBC:  Recent Labs  04/24/14 0405 04/25/14 0500  WBC 8.5 7.1  NEUTROABS 4.4 3.4  HGB 11.8* 11.4*  HCT 35.1* 34.5*  MCV 88.4 89.4  PLT 333 334   Cardiac Enzymes:  Recent Labs  04/23/14 0045 04/23/14 0610 04/23/14 1257  TROPONINI 0.11* 0.11* 0.05*   BNP: No results for input(s): PROBNP in the last 72 hours. D-Dimer:  Recent Labs  04/22/14 2012  DDIMER 0.70*   Hemoglobin  A1C:  Recent Labs  04/23/14 0045  HGBA1C 8.2*   Fasting Lipid Panel:  Recent Labs  04/23/14 0045  CHOL 231*  HDL 45  LDLCALC 127*  TRIG 297*  CHOLHDL 5.1   Thyroid Function Tests:  Recent Labs  04/23/14 0045  TSH 5.257*     Radiology/Studies:  No results found.  PHYSICAL EXAM General: Well developed, well nourished, in no acute distress. Head: Normocephalic, atraumatic, sclera non-icteric, oropharynx is clear Neck: Negative for carotid bruits. JVD not elevated. No adenopathy Lungs: Clear bilaterally to auscultation without wheezes, rales, or rhonchi. Breathing is unlabored. Heart: RRR S1 S2 without murmurs, rubs, or gallops.  Abdomen: Soft, non-tender, non-distended with normoactive bowel sounds. No hepatomegaly. No rebound/guarding. No obvious abdominal masses. Msk:  Strength and tone appears normal for age. Extremities: No clubbing, cyanosis or edema.  Distal pedal pulses are 2+ and equal bilaterally. No wrist hematoma Neuro: Alert and oriented X 3. Moves all extremities spontaneously. Psych:  Responds to questions appropriately with a normal  affect.  ASSESSMENT AND PLAN: 1. Noncardiac chest pain. Normal cardiac cath. OK for DC today 2. DM 3. HTN controlled.  Present on Admission:  . Chest pain  Signed, Daelynn Blower [place], [place] 04/25/2014 7:49 AM

## 2014-04-25 NOTE — Discharge Summary (Signed)
Kayla Medina, 81 y.o., DOB [birth year], MRN [id number]. Admission date: 04/22/2014 Discharge Date 04/25/2014 Primary MD Marton Redwood, MD Admitting Physician Shanda Howells, MD  Admission Diagnosis  Chest pain, unspecified chest pain type [R07.9]  PCP please follow on: - Check CBC, BMP, during next visit within 1 week.  Discharge Diagnosis   Active Problems:   Chest pain   Dyspnea   Obstructive thrombus     Past Medical History  Diagnosis Date  . Chest pain, unspecified   . Other and unspecified hyperlipidemia   . Shortness of breath   . Palpitations   . Shingles     hx  . Diabetes mellitus without complication   . Hypertension   . Rosacea   . Anal fissure   . Pneumonia   . PVC (premature ventricular contraction)   . Glaucoma     pt. states no-glaucoma 07/20/13  . Subclinical hypothyroidism     Past Surgical History  Procedure Laterality Date  . Bladder repair    . Bowel resection    . Bilateral eye surgery/cataract repair  3/09  . Total abdominal hysterectomy    . Appendectomy    . Left heart catheterization with coronary angiogram N/A 04/24/2014    Procedure: LEFT HEART CATHETERIZATION WITH CORONARY ANGIOGRAM;  Surgeon: Wellington Hampshire, MD;  Location: [facility];  Service: Cardiovascular;  Laterality: N/A;     Hospital Course See H&P, Labs, Consult and Test reports for all details in brief, patient was admitted for **  Active Problems:   Chest pain   Dyspnea   Obstructive thrombus   Admission history of present illness/brief narrative: This is a 81 y.o. year old female with significant past medical history of HTN, type 2 DM, PVCs presenting with chest pain, dyspnea, subclavian mural thrombus. Resents with intermittent chest pain over the last 2 weeks , patient to troponins were mildly elevated at 0.112, with new left bundle branch block, she had cardiac cath by Dr. Fletcher Anon on 04/24/14, without evidence of significant obstructive coronary artery disease , her pain was  felt most likely to be non-typical secondary to epigastric origin, most likely related to reflux, patient was started on Protonix. CT angiogram is negative for PE however does show   mild mural thrombus and calcification at the margin of the left subclavian artery with less than 50% luminal narrowing. Patient was started initially on heparin drip, as seen by vascular surgery who recommended to stop heparin drip, and to continue patient on aspirin, and no further workup unless it's become symptomatic.  Chest pain, left bundle branch block, elevated troponins - Cardiac cath on 04/24/14, showing no evidence of obstructive coronary artery disease. - Chest pain most likely related to epigastric origin, will start on Protonix on discharge.   Left subclavian stenosis with Mural thrombus - Vascular surgery input appreciated, this can be normal variant of atherosclerosis - Continue with aspirin on discharge   Diabetes mellitus - Was controlled during hospital stay with insulin sliding scale. - We'll hold metformin total of 48 hours after cardiac cath, can be resumed on 2/5/616.  Hypertension - Blood pressure acceptable, continue with home medication.  Chronic systolic CHF - EF is 38% by left ventriculography - Continue with aspirin and losartan, cardiology added low-dose Coreg.   Consults  Cardiology Vascular surgery  Procedures Cardiac cath by Dr. Fletcher Anon 2/3  Significant Tests:  See full reports for all details    Dg Chest 2 View  04/22/2014   CLINICAL DATA:  Chest pain and tightness. Left arm pain. Cough. Shortness of breath.  EXAM: CHEST  2 VIEW  COMPARISON:  None.  FINDINGS: Midline trachea. Normal heart size. Atherosclerosis in the transverse aorta. No pleural effusion or pneumothorax. Mild biapical pleural-parenchymal scarring. Greater on the left. Mild volume loss and probable scarring at the left lung base.  IMPRESSION: No acute cardiopulmonary disease.  Aortic atherosclerosis.    Electronically Signed   By: Abigail Miyamoto M.D.   On: 04/22/2014 19:59   Ct Angio Chest Pe W/cm &/or Wo Cm  04/22/2014   CLINICAL DATA:  Acute onset of sharp mid chest pain for several days and left arm pain. Recent cough. Initial encounter.  EXAM: CT ANGIOGRAPHY CHEST WITH CONTRAST  TECHNIQUE: Multidetector CT imaging of the chest was performed using the standard protocol during bolus administration of intravenous contrast. Multiplanar CT image reconstructions and MIPs were obtained to evaluate the vascular anatomy.  CONTRAST:  80 mL of Omnipaque 350 IV contrast  COMPARISON:  Chest radiograph performed earlier today at 7:15 p.m.  FINDINGS: There is no evidence of pulmonary embolus.  Mild bilateral dependent subsegmental atelectasis is noted, with minimal associated nonspecific nodularity. Additional relatively diffuse minimal peripheral nodularity is noted within the upper lung lobes. This could reflect a mild atypical infectious process. There is no evidence of significant focal consolidation, pleural effusion or pneumothorax. No masses are identified; no abnormal focal contrast enhancement is seen.  Scattered mediastinal nodes remain normal in size, without evidence of mediastinal lymphadenopathy. No pericardial effusion is identified. Mild mural thrombus and calcification is noted at the origin of the left subclavian artery, with less than 50% luminal narrowing. The great vessels are otherwise grossly unremarkable. No axillary lymphadenopathy is seen. The thyroid gland is unremarkable in appearance.  Nonspecific vaguely decreased attenuation is noted within the liver near the hepatic hilum. The visualized portions of the spleen are unremarkable.  No acute osseous abnormalities are seen.  Review of the MIP images confirms the above findings.  IMPRESSION: 1. No evidence of pulmonary embolus. 2. Mild bilateral dependent subsegmental atelectasis, with minimal associated nonspecific nodularity. Additional relatively  diffuse minimal peripheral nodularity within the upper lung lobes. This could reflect a mild atypical infectious process. 3. Mild mural thrombus and calcification at the origin of the left subclavian artery, with less than 50% luminal narrowing. 4. Nonspecific vaguely decreased attenuation within the liver near the hepatic hilum. This distribution is somewhat unusual for fatty infiltration, though incompletely visualized. Would correlate with LFTs and consider dynamic liver protocol MRI or CT for further evaluation.   Electronically Signed   By: Garald Balding M.D.   On: 04/22/2014 22:32     Today   Subjective:   Jolayne Branson today has no headache,no chest abdominal pain,no new weakness tingling or numbness, feels much better wants to go home today.   Objective:   Blood pressure 123/57, pulse 67, temperature 98 F (36.7 C), temperature source Oral, resp. rate 18, height 5\' 6"  (1.676 m), weight 72.6 kg (160 lb 0.9 oz), SpO2 98 %.  Intake/Output Summary (Last 24 hours) at 04/25/14 0933 Last data filed at 04/25/14 0800  Gross per 24 hour  Intake    240 ml  Output    350 ml  Net   -110 ml    Exam Awake Alert, Oriented *3, No new F.N deficits, Normal affect [place].AT,PERRAL Supple Neck,No JVD, No cervical lymphadenopathy appriciated.  Symmetrical Chest wall movement, Good air movement bilaterally, CTAB RRR,No Gallops,Rubs or new Murmurs,  No Parasternal Heave +ve B.Sounds, Abd Soft, Non tender, No organomegaly appriciated, No rebound -guarding or rigidity. No Cyanosis, Clubbing or edema, No new Rash or bruise  Data Review   Cultures -   CBC w Diff: Lab Results  Component Value Date   WBC 7.1 04/25/2014   HGB 11.4* 04/25/2014   HCT 34.5* 04/25/2014   PLT 334 04/25/2014   LYMPHOPCT 33 04/25/2014   MONOPCT 15* 04/25/2014   EOSPCT 4 04/25/2014   BASOPCT 0 04/25/2014   CMP: Lab Results  Component Value Date   NA 139 04/25/2014   K 4.1 04/25/2014   CL 110 04/25/2014   CO2 22  04/25/2014   BUN 12 04/25/2014   CREATININE 0.89 04/25/2014   PROT 5.6* 04/25/2014   ALBUMIN 3.0* 04/25/2014   BILITOT 0.4 04/25/2014   ALKPHOS 51 04/25/2014   AST 18 04/25/2014   ALT 12 04/25/2014  .  Micro Results Recent Results (from the past 240 hour(s))  Blood culture (routine x 2)     Status: None (Preliminary result)   Collection Time: 04/23/14 12:48 AM  Result Value Ref Range Status   Specimen Description BLOOD LEFT HAND  Final   Special Requests BOTTLES DRAWN AEROBIC AND ANAEROBIC 5CC EA  Final   Culture   Final           BLOOD CULTURE RECEIVED NO GROWTH TO DATE CULTURE WILL BE HELD FOR 5 DAYS BEFORE ISSUING A FINAL NEGATIVE REPORT Performed at [facility]    Report Status PENDING  Incomplete  Blood culture (routine x 2)     Status: None (Preliminary result)   Collection Time: 04/23/14 12:50 AM  Result Value Ref Range Status   Specimen Description BLOOD LEFT FOREARM  Final   Special Requests BOTTLES DRAWN AEROBIC AND ANAEROBIC 5CC EA  Final   Culture   Final           BLOOD CULTURE RECEIVED NO GROWTH TO DATE CULTURE WILL BE HELD FOR 5 DAYS BEFORE ISSUING A FINAL NEGATIVE REPORT Performed at [facility]    Report Status PENDING  Incomplete  MRSA PCR Screening     Status: None   Collection Time: 04/24/14  2:46 AM  Result Value Ref Range Status   MRSA by PCR NEGATIVE NEGATIVE Final    Comment:        The GeneXpert MRSA Assay (FDA approved for NASAL specimens only), is one component of a comprehensive MRSA colonization surveillance program. It is not intended to diagnose MRSA infection nor to guide or monitor treatment for MRSA infections.      Discharge Instructions      Follow-up Information    Follow up with Marton Redwood, MD. Schedule an appointment as soon as possible for a visit in 1 week.   Specialty:  Internal Medicine   Why:  post hospital visit   Contact information:   [address] [place] [address] [phone]        Discharge Medications     Medication List    TAKE these medications        ALPRAZolam 0.5 MG tablet  Commonly known as:  XANAX  Take 0.5 mg by mouth at bedtime as needed for anxiety.     aspirin 81 MG EC tablet  Take 1 tablet (81 mg total) by mouth daily.     carvedilol 3.125 MG tablet  Commonly known as:  COREG  Take 1 tablet (3.125 mg total) by mouth 2 (two) times  daily with a meal.     cholecalciferol 1000 UNITS tablet  Commonly known as:  VITAMIN D  Take 1,000 Units by mouth daily.     Co Q-10 30 MG Caps  Take 30 mg by mouth daily.     desonide 0.05 % cream  Commonly known as:  DESOWEN  Apply 1 application topically 2 (two) times daily as needed (for itching/dry skin).     fluconazole 100 MG tablet  Commonly known as:  DIFLUCAN  1 tablet daily for 7 days     guaiFENesin 600 MG 12 hr tablet  Commonly known as:  MUCINEX  Take 1 tablet (600 mg total) by mouth 2 (two) times daily as needed for cough or to loosen phlegm.     losartan 100 MG tablet  Commonly known as:  COZAAR  Take 100 mg by mouth 2 (two) times daily.     meclizine 25 MG tablet  Commonly known as:  ANTIVERT  Take 1 tablet (25 mg total) by mouth 3 (three) times daily as needed for dizziness.     metFORMIN 1000 MG tablet  Commonly known as:  GLUCOPHAGE  Take 1 tablet (1,000 mg total) by mouth 2 (two) times daily.  Start taking on:  04/26/2014     pantoprazole 40 MG tablet  Commonly known as:  PROTONIX  Take 1 tablet (40 mg total) by mouth daily.     SINGULAIR 10 MG tablet  Generic drug:  montelukast  Take 10 mg by mouth at bedtime.     vitamin B-12 1000 MCG tablet  Commonly known as:  CYANOCOBALAMIN  Take 1,000 mcg by mouth daily.     vitamin C 500 MG tablet  Commonly known as:  ASCORBIC ACID  Take 500 mg by mouth daily.     vitamin E 600 UNIT capsule  Take 400 Units by mouth daily.         Total Time in preparing paper work, data evaluation and todays exam - 35  minutes  Glennis Borger M.D on 04/25/2014 at 9:33 AM  [facility] Office  [phone]

## 2014-04-29 LAB — CULTURE, BLOOD (ROUTINE X 2)
Culture: NO GROWTH
Culture: NO GROWTH

## 2014-05-10 ENCOUNTER — Encounter: Payer: Self-pay | Admitting: Cardiovascular Disease

## 2014-05-10 ENCOUNTER — Ambulatory Visit (INDEPENDENT_AMBULATORY_CARE_PROVIDER_SITE_OTHER): Payer: Medicare Other | Admitting: Cardiovascular Disease

## 2014-05-10 VITALS — BP 128/76 | HR 84 | Ht 66.0 in | Wt 160.4 lb

## 2014-05-10 DIAGNOSIS — E785 Hyperlipidemia, unspecified: Secondary | ICD-10-CM

## 2014-05-10 DIAGNOSIS — I5181 Takotsubo syndrome: Secondary | ICD-10-CM

## 2014-05-10 DIAGNOSIS — I10 Essential (primary) hypertension: Secondary | ICD-10-CM

## 2014-05-10 HISTORY — DX: Takotsubo syndrome: I51.81

## 2014-05-10 MED ORDER — LOSARTAN POTASSIUM 50 MG PO TABS: 50.0000 mg | ORAL_TABLET | Freq: Every day | ORAL | Status: DC

## 2014-05-10 MED ORDER — CARVEDILOL 6.25 MG PO TABS: 6.2500 mg | ORAL_TABLET | Freq: Two times a day (BID) | ORAL | Status: DC

## 2014-05-10 NOTE — Progress Notes (Signed)
Cardiology Office Note   Date:  05/10/2014   ID:  Dalisha, Shively October 29, 1932, MRN [id number]  PCP:  Marton Redwood, MD  Cardiologist:   Acie Fredrickson Wonda Cheng, MD   No chief complaint on file.  Problem list: 1. Takotsubo  Syndrome 2. Mild acute systolic  CHF - EF 49%  3. Hypertension 4. Diabetes mellitus   History of Present Illness: Kayla Medina is a 81 y.o. female who presents for f/u of her mild MI - Takotsubo syndrome. Feels weak, no CP,  Breathing is ok.  Has some DOE .   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     Past Medical History  Diagnosis Date  . Chest pain, unspecified   . Other and unspecified hyperlipidemia   . Shortness of breath   . Palpitations   . Shingles     hx  . Diabetes mellitus without complication   . Hypertension   . Rosacea   . Anal fissure   . Pneumonia   . PVC (premature ventricular contraction)   . Glaucoma     pt. states no-glaucoma 07/20/13  . Subclinical hypothyroidism     Past Surgical History  Procedure Laterality Date  . Bladder repair    . Bowel resection    . Bilateral eye surgery/cataract repair  3/09  . Total abdominal hysterectomy    . Appendectomy    . Left heart catheterization with coronary angiogram N/A 04/24/2014    Procedure: LEFT HEART CATHETERIZATION WITH CORONARY ANGIOGRAM;  Surgeon: Wellington Hampshire, MD;  Location: [facility];  Service: Cardiovascular;  Laterality: N/A;     Current Outpatient Prescriptions  Medication Sig Dispense Refill  . ALPHA LIPOIC ACID PO Take 100 mg by mouth daily.    Marland Kitchen ALPRAZolam (XANAX) 0.5 MG tablet Take 0.5 mg by mouth at bedtime as needed for anxiety.    Marland Kitchen amLODipine (NORVASC) 2.5 MG tablet Take 2.5 mg by mouth daily.    Marland Kitchen aspirin EC 81 MG EC tablet Take 1 tablet (81 mg total) by mouth daily. 30 tablet 0  . carvedilol (COREG) 3.125 MG tablet Take 1 tablet (3.125 mg total) by mouth 2 (two) times daily with a meal. 60 tablet 0  . cholecalciferol (VITAMIN D) 1000 UNITS tablet Take  1,000 Units by mouth daily.    . Coenzyme Q10 (CO Q-10) 30 MG CAPS Take 30 mg by mouth daily.     . cycloSPORINE (RESTASIS) 0.05 % ophthalmic emulsion Place 1 drop into both eyes 2 (two) times daily.    Marland Kitchen desonide (DESOWEN) 0.05 % cream Apply 1 application topically 2 (two) times daily as needed (for itching/dry skin).    Marland Kitchen guaiFENesin (MUCINEX) 600 MG 12 hr tablet Take 1 tablet (600 mg total) by mouth 2 (two) times daily as needed for cough or to loosen phlegm.    Marland Kitchen losartan (COZAAR) 50 MG tablet Take 50 mg by mouth 2 (two) times daily.     . meclizine (ANTIVERT) 25 MG tablet Take 1 tablet (25 mg total) by mouth 3 (three) times daily as needed for dizziness. 30 tablet 0  . metFORMIN (GLUCOPHAGE) 1000 MG tablet Take 1 tablet (1,000 mg total) by mouth 2 (two) times daily.    . metroNIDAZOLE (METROCREAM) 0.75 % cream Apply 1 application topically 2 (two) times daily.    . montelukast (SINGULAIR) 10 MG tablet Take 10 mg by mouth at bedtime.     . pantoprazole (PROTONIX) 40 MG tablet Take  1 tablet (40 mg total) by mouth daily. 30 tablet 0  . vitamin B-12 (CYANOCOBALAMIN) 1000 MCG tablet Take 1,000 mcg by mouth daily.    . vitamin C (ASCORBIC ACID) 500 MG tablet Take 500 mg by mouth daily.    . vitamin E 600 UNIT capsule Take 400 Units by mouth daily.      No current facility-administered medications for this visit.    Allergies:   Codeine; Januvia; Other; Penicillins; and Sulfonamide derivatives    Social History:  The patient  reports that she has never smoked. She has never used smokeless tobacco. She reports that she does not drink alcohol or use illicit drugs.   Family History:  The patient's family history includes Alcoholism in her father; Alzheimer's disease in her mother; Diabetes in her daughter, maternal grandmother, and paternal grandmother; Hypertension in her mother; Other in her sister; Prostate cancer in her brother; Seizures in her sister; Thyroid disease in her brother, daughter,  mother, and son.    ROS:  Please see the history of present illness.    Review of Systems: Constitutional:  denies fever, chills, diaphoresis, appetite change and fatigue.  HEENT: denies photophobia, eye pain, redness, hearing loss, ear pain, congestion, sore throat, rhinorrhea, sneezing, neck pain, neck stiffness and tinnitus.  Respiratory: denies SOB, DOE, cough, chest tightness, and wheezing.  Cardiovascular: denies chest pain, palpitations and leg swelling.  Gastrointestinal: denies nausea, vomiting, abdominal pain, diarrhea, constipation, blood in stool.  Genitourinary: denies dysuria, urgency, frequency, hematuria, flank pain and difficulty urinating.  Musculoskeletal: denies  myalgias, back pain, joint swelling, arthralgias and gait problem.   Skin: denies pallor, rash and wound.  Neurological: denies dizziness, seizures, syncope, weakness, light-headedness, numbness and headaches.   Hematological: denies adenopathy, easy bruising, personal or family bleeding history.  Psychiatric/ Behavioral: denies suicidal ideation, mood changes, confusion, nervousness, sleep disturbance and agitation.       All other systems are reviewed and negative.    PHYSICAL EXAM: VS:  BP 128/76 mmHg  Pulse 84  Ht 5\' 6"  (1.676 m)  Wt 160 lb 6.4 oz (72.757 kg)  BMI 25.90 kg/m2  SpO2 96% , BMI Body mass index is 25.9 kg/(m^2). GEN: Well nourished, well developed, in no acute distress HEENT: normal Neck: no JVD, carotid bruits, or masses Cardiac: RRR; no murmurs, rubs, or gallops,no edema  Respiratory:  clear to auscultation bilaterally, normal work of breathing GI: soft, nontender, nondistended, + BS MS: no deformity or atrophy Skin: warm and dry, no rash Neuro:  Strength and sensation are intact Psych: normal   EKG:  EKG is not ordered today.    Recent Labs: 04/23/2014: TSH 5.257* 04/25/2014: ALT 12; BUN 12; Creatinine 0.89; Hemoglobin 11.4*; Platelets 334; Potassium 4.1; Sodium 139     Lipid Panel    Component Value Date/Time   CHOL 231* 04/23/2014 0045   TRIG 297* 04/23/2014 0045   HDL 45 04/23/2014 0045   CHOLHDL 5.1 04/23/2014 0045   VLDL 59* 04/23/2014 0045   LDLCALC 127* 04/23/2014 0045      Wt Readings from Last 3 Encounters:  05/10/14 160 lb 6.4 oz (72.757 kg)  04/25/14 160 lb 0.9 oz (72.6 kg)  07/20/13 162 lb (73.483 kg)      Other studies Reviewed: Additional studies/ records that were reviewed today include: . Review of the above records demonstrates:    ASSESSMENT AND PLAN:  1. Takotsubo  Syndrome- she was recently admitted to the hospital with episodes of chest discomfort.  She  had a mild increase in her troponin levels. Cardiac catheter station revealed smooth and normal coronary arteries but she did have mild systolic dysfunction with an ejection fraction of 40%. The left ventricular contact contractility pattern was consistent with Takotsubo syndrome.  Continue with carvedilol. We will increase the dose is 6.25 g twice a day.  2. Mild acute systolic  CHF - EF 59% - and TIMI with the carvedilol at higher dose. She's having a slight cough. We'll decrease her losartan to 50 mg once a day. We discussed the fact that losartan is not typically known to cause a cough. She is to avoid eating excessive salt.  3. Hypertension - her blood pressures well-controlled. Medicine changes as noted above.  4. Diabetes mellitus - followed by her general medical doctor.      Current medicines are reviewed at length with the patient today.  The patient does not have concerns regarding medicines.  The following changes have been made:  Increase coreg to 6.25, decrease losartan to 50 a day    Disposition:   FU with me in 3-4 weeks with BMP .     Signed, Joniel Graumann, Wonda Cheng, MD  05/10/2014 3:57 PM    [facility] [address], [place], [place]  [address] Phone: [phone]; Fax: [phone]

## 2014-05-10 NOTE — Patient Instructions (Addendum)
Your physician has recommended you make the following change in your medication:  DECREASE Losartan to 50 mg once daily INCREASE Coreg (Carvedilol) to 6.25 mg twice daily  Your physician recommends that you return for lab work in: on same day as your appointment with Dr. Acie Fredrickson - bmet  Your physician recommends that you schedule a follow-up appointment in: 3-4 weeks with Dr. Acie Fredrickson on Wednesday March 23 at 4:15

## 2014-05-20 ENCOUNTER — Telehealth: Payer: Self-pay | Admitting: Cardiovascular Disease

## 2014-05-20 NOTE — Telephone Encounter (Signed)
New Msg    Pt c/o medication issue:  1. Name of Medication: Carvedilol  2. How are you currently taking this medication (dosage and times per day)? 6.25 mg twice a day  3. Are you having a reaction (difficulty breathing--STAT)? Diarrhea, nausea  4. What is your medication issue? Can pt go back to normal dosage due to side effects? Please call and advise.

## 2014-05-20 NOTE — Telephone Encounter (Signed)
Agree that these symptoms do not sound cardiac.

## 2014-05-20 NOTE — Telephone Encounter (Signed)
Called and spoke with pt in regards to new symptoms of diarrhea and nausea. Pt states that along with those symptoms she also has a terrible headache and feels weak and has no appetite. Pt stated that these symptoms started Saturday and she felt like it may be coming from the increased dose of Coreg. Pt states that she has no other symptoms, no chest pain, no dizziness, no SOB. Informed pt that I would go talk to our Flex NP today, Kayla Medina, in regards to her symptoms. Informed pt to also call PCP to get an appt to see them. Spoke with Kayla Bayley, NP and he agreed that pt should go see her PCP and said he doesn't feel these symptoms are related to the [place]. Called back to speak with pt but daughter answered the phone and said pt was unable to come to the phone at the time. Daughter stated that she was about to call PCP and inform them of these symptoms and see about getting pt an appt.

## 2014-06-12 ENCOUNTER — Ambulatory Visit (INDEPENDENT_AMBULATORY_CARE_PROVIDER_SITE_OTHER): Payer: Medicare Other | Admitting: Cardiovascular Disease

## 2014-06-12 ENCOUNTER — Encounter: Payer: Self-pay | Admitting: Cardiovascular Disease

## 2014-06-12 VITALS — BP 166/76 | HR 66 | Ht 66.0 in | Wt 162.0 lb

## 2014-06-12 DIAGNOSIS — I5181 Takotsubo syndrome: Secondary | ICD-10-CM

## 2014-06-12 DIAGNOSIS — I10 Essential (primary) hypertension: Secondary | ICD-10-CM

## 2014-06-12 MED ORDER — LOSARTAN POTASSIUM 50 MG PO TABS: ORAL_TABLET | ORAL | Status: DC

## 2014-06-12 NOTE — Progress Notes (Signed)
Cardiology Office Note   Date:  06/12/2014   ID:  Ahlia, Lemanski 1932/08/31, MRN [id number]  PCP:  Marton Redwood, MD  Cardiologist:   Acie Fredrickson Wonda Cheng, MD   Chief Complaint  Patient presents with  . Follow-up     Takotsubo   1. Takotsubo Syndrome 2. Mild acute systolic CHF - EF 37%  3. Hypertension 4. Diabetes mellitus  History of Present Illness: ABBEYGAIL IGOE is a 81 y.o. female who presents for f/u of her mild MI - Takotsubo syndrome. Feels weak, no CP, Breathing is ok. Has some DOE .   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    History of Present Illness: JOLLEEN SEMAN is a 81 y.o. female who presents for follow up of her Takotsubo.  BP at home is ok.  A bit elevated here. 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     Past Medical History  Diagnosis Date  . Chest pain, unspecified   . Other and unspecified hyperlipidemia   . Shortness of breath   . Palpitations   . Shingles     hx  . Diabetes mellitus without complication   . Hypertension   . Rosacea   . Anal fissure   . Pneumonia   . PVC (premature ventricular contraction)   . Glaucoma     pt. states no-glaucoma 07/20/13  . Subclinical hypothyroidism     Past Surgical History  Procedure Laterality Date  . Bladder repair    . Bowel resection    . Bilateral eye surgery/cataract repair  3/09  . Total abdominal hysterectomy    . Appendectomy    . Left heart catheterization with coronary angiogram N/A 04/24/2014    Procedure: LEFT HEART CATHETERIZATION WITH CORONARY ANGIOGRAM;  Surgeon: Wellington Hampshire, MD;  Location: [facility];  Service: Cardiovascular;  Laterality: N/A;     Current Outpatient Prescriptions  Medication Sig Dispense Refill  . ALPHA LIPOIC ACID PO Take 100 mg by mouth daily.    Marland Kitchen ALPRAZolam (XANAX) 0.5 MG tablet Take 0.5 mg by mouth at bedtime as needed for anxiety.    Marland Kitchen aspirin EC 81 MG EC tablet Take 1 tablet (81 mg total) by mouth daily. 30 tablet 0  .  atorvastatin (LIPITOR) 10 MG tablet Take 10 mg by mouth daily.    . carvedilol (COREG) 6.25 MG tablet Take 1 tablet (6.25 mg total) by mouth 2 (two) times daily with a meal. 60 tablet 11  . cholecalciferol (VITAMIN D) 1000 UNITS tablet Take 1,000 Units by mouth daily.    . Coenzyme Q10 (CO Q-10) 30 MG CAPS Take 30 mg by mouth daily.     Marland Kitchen desonide (DESOWEN) 0.05 % cream Apply 1 application topically 2 (two) times daily as needed (for itching/dry skin).    Marland Kitchen losartan (COZAAR) 50 MG tablet Take 1 tablet (50 mg total) by mouth daily. 31 tablet 11  . meclizine (ANTIVERT) 25 MG tablet Take 1 tablet (25 mg total) by mouth 3 (three) times daily as needed for dizziness. 30 tablet 0  . metFORMIN (GLUCOPHAGE) 1000 MG tablet Take 1 tablet (1,000 mg total) by mouth 2 (two) times daily.    . metroNIDAZOLE (METROCREAM) 0.75 % cream Apply 1 application topically 2 (two) times daily.    . montelukast (SINGULAIR) 10 MG tablet Take 10 mg by mouth at bedtime.     Marland Kitchen omeprazole (PRILOSEC) 20 MG capsule Take 20 mg by  mouth daily.    . vitamin B-12 (CYANOCOBALAMIN) 1000 MCG tablet Take 1,000 mcg by mouth daily.    . vitamin C (ASCORBIC ACID) 500 MG tablet Take 500 mg by mouth daily.    . vitamin E 600 UNIT capsule Take 400 Units by mouth daily.     Marland Kitchen guaiFENesin (MUCINEX) 600 MG 12 hr tablet Take 1 tablet (600 mg total) by mouth 2 (two) times daily as needed for cough or to loosen phlegm. (Patient not taking: Reported on 06/12/2014)     No current facility-administered medications for this visit.    Allergies:   Codeine; Januvia; Other; Penicillins; and Sulfonamide derivatives    Social History:  The patient  reports that she has never smoked. She has never used smokeless tobacco. She reports that she does not drink alcohol or use illicit drugs.   Family History:  The patient's family history includes Alcoholism in her father; Alzheimer's disease in her mother; Diabetes in her daughter, maternal grandmother, and  paternal grandmother; Hypertension in her mother; Other in her sister; Prostate cancer in her brother; Seizures in her sister; Thyroid disease in her brother, daughter, mother, and son.    ROS:  Please see the history of present illness.    Review of Systems: Constitutional:  denies fever, chills, diaphoresis, appetite change and fatigue.  HEENT: denies photophobia, eye pain, redness, hearing loss, ear pain, congestion, sore throat, rhinorrhea, sneezing, neck pain, neck stiffness and tinnitus.  Respiratory: denies SOB, DOE, cough, chest tightness, and wheezing.  Cardiovascular: denies chest pain, palpitations and leg swelling.  Gastrointestinal: denies nausea, vomiting, abdominal pain, diarrhea, constipation, blood in stool.  Genitourinary: denies dysuria, urgency, frequency, hematuria, flank pain and difficulty urinating.  Musculoskeletal: denies  myalgias, back pain, joint swelling, arthralgias and gait problem.   Skin: denies pallor, rash and wound.  Neurological: denies dizziness, seizures, syncope, weakness, light-headedness, numbness and headaches.   Hematological: denies adenopathy, easy bruising, personal or family bleeding history.  Psychiatric/ Behavioral: denies suicidal ideation, mood changes, confusion, nervousness, sleep disturbance and agitation.       All other systems are reviewed and negative.    PHYSICAL EXAM: VS:  BP 166/76 mmHg  Pulse 66  Ht 5\' 6"  (1.676 m)  Wt 162 lb (73.483 kg)  BMI 26.16 kg/m2  SpO2 97% , BMI Body mass index is 26.16 kg/(m^2). GEN: Well nourished, well developed, in no acute distress HEENT: normal Neck: no JVD, carotid bruits, or masses Cardiac: RRR; no murmurs, rubs, or gallops,no edema  Respiratory:  clear to auscultation bilaterally, normal work of breathing GI: soft, nontender, nondistended, + BS MS: no deformity or atrophy Skin: warm and dry, no rash Neuro:  Strength and sensation are intact Psych: normal   EKG:  EKG is not  ordered today.    Recent Labs: 04/23/2014: TSH 5.257* 04/25/2014: ALT 12; BUN 12; Creatinine 0.89; Hemoglobin 11.4*; Platelets 334; Potassium 4.1; Sodium 139    Lipid Panel    Component Value Date/Time   CHOL 231* 04/23/2014 0045   TRIG 297* 04/23/2014 0045   HDL 45 04/23/2014 0045   CHOLHDL 5.1 04/23/2014 0045   VLDL 59* 04/23/2014 0045   LDLCALC 127* 04/23/2014 0045      Wt Readings from Last 3 Encounters:  06/12/14 162 lb (73.483 kg)  05/10/14 160 lb 6.4 oz (72.757 kg)  04/25/14 160 lb 0.9 oz (72.6 kg)      Other studies Reviewed: Additional studies/ records that were reviewed today include: . Review of the  above records demonstrates:    ASSESSMENT AND PLAN:  1.  Takotsubo syndrome: The patient presented to the hospital 3 months ago with Tikosyn was syndrome. She is done remarkably well on medical therapy.   Her blood pressure is mildly elevated. We'll increase the losartan to 100 mg a day. I will see her back in 3 months. I anticipate getting an echocardiogram prior to that next visit.  2. Essential hypertension:  She eats a fairly low-salt diet. We will increase her losartan today to 100 mg a day. She'll keep her blood pressure log and will give to [place] during her next visit.   Current medicines are reviewed at length with the patient today.  The patient does not have concerns regarding medicines. The following changes have been made:  no change  Labs/ tests ordered today include:  No orders of the defined types were placed in this encounter.     Disposition:   FU with me in 3 months     Signed, Nahser, Wonda Cheng, MD  06/12/2014 4:38 PM    [facility] [place], [place], [place]  [address] Phone: [phone]; Fax: [phone]

## 2014-06-12 NOTE — Patient Instructions (Addendum)
Your physician has recommended you make the following change in your medication:  1) INCREASE Losartan to 100 mg daily  Your physician recommends that you schedule a follow-up appointment in: 3 months with Dr. Acie Fredrickson  Your physician has requested that you have an echocardiogram. Echocardiography is a painless test that uses sound waves to create images of your heart. It provides your doctor with information about the size and shape of your heart and how well your heart's chambers and valves are working. This procedure takes approximately one hour. There are no restrictions for this procedure. SCHEDULE FOR A WEEK BEFORE 3 MONTH FOLLOW UP  APPOINTMENT.

## 2014-06-13 LAB — BASIC METABOLIC PANEL
BUN: 21 mg/dL (ref 6–23)
CALCIUM: 9 mg/dL (ref 8.4–10.5)
CO2: 25 mEq/L (ref 19–32)
Chloride: 106 mEq/L (ref 96–112)
Creatinine, Ser: 1.14 mg/dL (ref 0.40–1.20)
GFR: 48.75 mL/min — AB (ref >60.00)
Glucose, Bld: 254 mg/dL — ABNORMAL HIGH (ref 70–99)
Potassium: 4.2 mEq/L (ref 3.5–5.1)
Sodium: 138 mEq/L (ref 135–145)

## 2014-06-14 ENCOUNTER — Telehealth: Payer: Self-pay | Admitting: Cardiovascular Disease

## 2014-06-14 NOTE — Telephone Encounter (Signed)
Lab results reviewed with patient who verbalized understanding.  Patient states she is aware of elevated glucose; states she was not fasting prior to blood draw

## 2014-06-14 NOTE — Telephone Encounter (Signed)
New problem   Pt returning your call concerning lab results. Please call pt.

## 2014-06-14 NOTE — Telephone Encounter (Signed)
Follow Up  Pt returning phone call. Please call back and discuss.  

## 2014-09-06 ENCOUNTER — Other Ambulatory Visit: Payer: Self-pay

## 2014-09-06 ENCOUNTER — Ambulatory Visit (HOSPITAL_COMMUNITY): Payer: Medicare Other | Attending: Cardiovascular Disease

## 2014-09-06 ENCOUNTER — Other Ambulatory Visit (HOSPITAL_COMMUNITY): Payer: Medicare Other

## 2014-09-06 DIAGNOSIS — I5181 Takotsubo syndrome: Secondary | ICD-10-CM

## 2014-09-06 DIAGNOSIS — I10 Essential (primary) hypertension: Secondary | ICD-10-CM

## 2014-09-06 DIAGNOSIS — I340 Nonrheumatic mitral (valve) insufficiency: Secondary | ICD-10-CM | POA: Diagnosis not present

## 2014-09-06 DIAGNOSIS — I517 Cardiomegaly: Secondary | ICD-10-CM | POA: Diagnosis not present

## 2014-09-10 ENCOUNTER — Ambulatory Visit (INDEPENDENT_AMBULATORY_CARE_PROVIDER_SITE_OTHER): Payer: Medicare Other | Admitting: Cardiovascular Disease

## 2014-09-10 ENCOUNTER — Encounter: Payer: Self-pay | Admitting: Cardiovascular Disease

## 2014-09-10 VITALS — BP 140/76 | HR 71 | Ht 66.0 in | Wt 162.6 lb

## 2014-09-10 DIAGNOSIS — I5022 Chronic systolic (congestive) heart failure: Secondary | ICD-10-CM

## 2014-09-10 DIAGNOSIS — I5023 Acute on chronic systolic (congestive) heart failure: Secondary | ICD-10-CM | POA: Diagnosis not present

## 2014-09-10 DIAGNOSIS — I5181 Takotsubo syndrome: Secondary | ICD-10-CM

## 2014-09-10 HISTORY — DX: Chronic systolic (congestive) heart failure: I50.22

## 2014-09-10 MED ORDER — CARVEDILOL 6.25 MG PO TABS: 6.2500 mg | ORAL_TABLET | Freq: Two times a day (BID) | ORAL | Status: DC

## 2014-09-10 MED ORDER — VALSARTAN 320 MG PO TABS: 320.0000 mg | ORAL_TABLET | Freq: Every day | ORAL | Status: DC

## 2014-09-10 NOTE — Patient Instructions (Addendum)
Medication Instructions:  STOP Amlodipine STOP Losartan INCREASE Coreg to 6.25 mg twice daily - take 12 hours apart START Valsartan 320 mg daily  Labwork: None Ordered  Testing/Procedures: None Ordered  Follow-Up: Your physician recommends that you return for office visit with Dr. Acie Fredrickson on Wednesday July 6 @ 9:30 am

## 2014-09-10 NOTE — Progress Notes (Signed)
Cardiology Office Note   Date:  09/10/2014   ID:  [name], [name] Mar 23, 1932, MRN [id number]  PCP:  Marton Redwood, MD  Cardiologist:   Acie Fredrickson Wonda Cheng, MD   Chief Complaint  Patient presents with  . Congestive Heart Failure   1. Takotsubo Syndrome 2.actue on chronic systolic CHF - EF 62-37%  3. Hypertension 4. Diabetes mellitus  History of Present Illness: Kayla Medina is a 81 y.o. female who presents for f/u of her mild MI - Takotsubo syndrome. Feels weak, no CP, Breathing is ok. Has some DOE .   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    History of Present Illness: Kayla Medina is a 81 y.o. female who presents for follow up of her Takotsubo.  BP at home is ok.  A bit elevated here. 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   September 10, 2014: Kayla Medina EF has fallen ( unexpectedly) .  She was diagnosed with Takotsubo  syndrome and has been on a beta blocker and angiotensin blocker since that time. Her ejection fraction has fallen to 25-30%.  She was recently seen by Dr. Brigitte Pulse and had some hypertension. Amlodipine was added to her medical regimen at that time.   Past Medical History  Diagnosis Date  . Chest pain, unspecified   . Other and unspecified hyperlipidemia   . Shortness of breath   . Palpitations   . Shingles     hx  . Diabetes mellitus without complication   . Hypertension   . Rosacea   . Anal fissure   . Pneumonia   . PVC (premature ventricular contraction)   . Glaucoma     pt. states no-glaucoma 07/20/13  . Subclinical hypothyroidism     Past Surgical History  Procedure Laterality Date  . Bladder repair    . Bowel resection    . Bilateral eye surgery/cataract repair  3/09  . Total abdominal hysterectomy    . Appendectomy    . Left heart catheterization with coronary angiogram N/A 04/24/2014    Procedure: LEFT HEART CATHETERIZATION WITH CORONARY ANGIOGRAM;  Surgeon: Wellington Hampshire, MD;  Location: [facility] CATH [facility];  Service:  Cardiovascular;  Laterality: N/A;     Current Outpatient Prescriptions  Medication Sig Dispense Refill  . amLODipine (NORVASC) 2.5 MG tablet Take 2.5 mg by mouth daily.     . ALPHA LIPOIC ACID PO Take 100 mg by mouth daily.    Marland Kitchen ALPRAZolam (XANAX) 0.5 MG tablet Take 0.5 mg by mouth at bedtime as needed for anxiety.    Marland Kitchen aspirin EC 81 MG EC tablet Take 1 tablet (81 mg total) by mouth daily. 30 tablet 0  . atorvastatin (LIPITOR) 10 MG tablet Take 10 mg by mouth daily.    . carvedilol (COREG) 6.25 MG tablet Take 1 tablet (6.25 mg total) by mouth 2 (two) times daily with a meal. 60 tablet 11  . cholecalciferol (VITAMIN D) 1000 UNITS tablet Take 1,000 Units by mouth daily.    . Coenzyme Q10 (CO Q-10) 30 MG CAPS Take 30 mg by mouth daily.     Marland Kitchen desonide (DESOWEN) 0.05 % cream Apply 1 application topically 2 (two) times daily as needed (for itching/dry skin).    Marland Kitchen guaiFENesin (MUCINEX) 600 MG 12 hr tablet Take 1 tablet (600 mg total) by mouth 2 (two) times daily as needed for cough or to loosen phlegm. (Patient not taking: Reported on 06/12/2014)    .  losartan (COZAAR) 50 MG tablet Take 2 tablets by mouth daily 60 tablet 6  . meclizine (ANTIVERT) 25 MG tablet Take 1 tablet (25 mg total) by mouth 3 (three) times daily as needed for dizziness. 30 tablet 0  . metFORMIN (GLUCOPHAGE) 1000 MG tablet Take 1 tablet (1,000 mg total) by mouth 2 (two) times daily.    . metroNIDAZOLE (METROCREAM) 0.75 % cream Apply 1 application topically 2 (two) times daily.    . montelukast (SINGULAIR) 10 MG tablet Take 10 mg by mouth at bedtime.     Marland Kitchen omeprazole (PRILOSEC) 20 MG capsule Take 20 mg by mouth daily.    . vitamin B-12 (CYANOCOBALAMIN) 1000 MCG tablet Take 1,000 mcg by mouth daily.    . vitamin C (ASCORBIC ACID) 500 MG tablet Take 500 mg by mouth daily.    . vitamin E 600 UNIT capsule Take 400 Units by mouth daily.      No current facility-administered medications for this visit.    Allergies:   Codeine;  Januvia; Other; Penicillins; and Sulfonamide derivatives    Social History:  The patient  reports that she has never smoked. She has never used smokeless tobacco. She reports that she does not drink alcohol or use illicit drugs.   Family History:  The patient's family history includes Alcoholism in her father; Alzheimer's disease in her mother; Diabetes in her daughter, maternal grandmother, and paternal grandmother; Hypertension in her mother; Other in her sister; Prostate cancer in her brother; Seizures in her sister; Thyroid disease in her brother, daughter, mother, and son.    ROS:  Please see the history of present illness.    Review of Systems: Constitutional:  denies fever, chills, diaphoresis, appetite change and fatigue.  HEENT: denies photophobia, eye pain, redness, hearing loss, ear pain, congestion, sore throat, rhinorrhea, sneezing, neck pain, neck stiffness and tinnitus.  Respiratory: denies SOB, DOE, cough, chest tightness, and wheezing.  Cardiovascular: denies chest pain, palpitations and leg swelling.  Gastrointestinal: denies nausea, vomiting, abdominal pain, diarrhea, constipation, blood in stool.  Genitourinary: denies dysuria, urgency, frequency, hematuria, flank pain and difficulty urinating.  Musculoskeletal: denies  myalgias, back pain, joint swelling, arthralgias and gait problem.   Skin: denies pallor, rash and wound.  Neurological: denies dizziness, seizures, syncope, weakness, light-headedness, numbness and headaches.   Hematological: denies adenopathy, easy bruising, personal or family bleeding history.  Psychiatric/ Behavioral: denies suicidal ideation, mood changes, confusion, nervousness, sleep disturbance and agitation.       All other systems are reviewed and negative.    PHYSICAL EXAM: VS:  BP 140/76 mmHg  Pulse 71  Ht 5\' 6"  (1.676 m)  Wt 73.755 kg (162 lb 9.6 oz)  BMI 26.26 kg/m2  SpO2 98% , BMI Body mass index is 26.26 kg/(m^2). GEN: Well  nourished, well developed, in no acute distress HEENT: normal Neck: no JVD, carotid bruits, or masses Cardiac: RRR; no murmurs, rubs, or gallops,no edema  Respiratory:  clear to auscultation bilaterally, normal work of breathing GI: soft, nontender, nondistended, + BS MS: no deformity or atrophy Skin: warm and dry, no rash Neuro:  Strength and sensation are intact Psych: normal   EKG:  EKG is not ordered today.    Recent Labs: 04/23/2014: TSH 5.257* 04/25/2014: ALT 12; Hemoglobin 11.4*; Platelets 334 06/12/2014: BUN 21; Creatinine, Ser 1.14; Potassium 4.2; Sodium 138    Lipid Panel    Component Value Date/Time   CHOL 231* 04/23/2014 0045   TRIG 297* 04/23/2014 0045   HDL 45  04/23/2014 0045   CHOLHDL 5.1 04/23/2014 0045   VLDL 59* 04/23/2014 0045   LDLCALC 127* 04/23/2014 0045      Wt Readings from Last 3 Encounters:  09/10/14 73.755 kg (162 lb 9.6 oz)  06/12/14 73.483 kg (162 lb)  05/10/14 72.757 kg (160 lb 6.4 oz)      Other studies Reviewed: Additional studies/ records that were reviewed today include: . Review of the above records demonstrates:    ASSESSMENT AND PLAN:  1.  Takotsubo syndrome:  Chronic systolic congestive heart failure: The patient presented to the hospital 3 months ago with Tikosyn was syndrome.   Her left ventricle systolic function has decreases. Will increase her carvedilol to 6.25 mg twice a day. She actually is only taking 3.125 mg twice a day at the current time. We will discontinue the losartan and start her on valsartan 320 mg a day. We will discontinue the amlodipine.  Will see her back  Her back in several weeks.  2. Essential hypertension:  Blood pressure remains mildly elevated. At this point we'll discontinue the amlodipine. We'll start her on valsartan 320 mg a day and discontinue the losartan. Current medicines are reviewed at length with the patient today.  The patient does not have concerns regarding medicines. The following changes  have been made:  no change  Labs/ tests ordered today include:  No orders of the defined types were placed in this encounter.     Disposition:   FU with me in 2-3 weeks     Signed, Nahser, Wonda Cheng, MD  09/10/2014 4:10 PM    [facility] [place], [place], [place]  [address] Phone: [phone]; Fax: [phone]

## 2014-09-16 ENCOUNTER — Ambulatory Visit (INDEPENDENT_AMBULATORY_CARE_PROVIDER_SITE_OTHER): Payer: Medicare Other

## 2014-09-16 ENCOUNTER — Ambulatory Visit (INDEPENDENT_AMBULATORY_CARE_PROVIDER_SITE_OTHER): Payer: Medicare Other | Admitting: Podiatry

## 2014-09-16 ENCOUNTER — Other Ambulatory Visit: Payer: Self-pay

## 2014-09-16 DIAGNOSIS — M722 Plantar fascial fibromatosis: Secondary | ICD-10-CM | POA: Diagnosis not present

## 2014-09-16 NOTE — Progress Notes (Signed)
Subjective:     Patient ID: Kayla Medina, female   DOB: May 19, 1932, 81 y.o.   MRN: [id number]  HPI patient presents with at least 4 month history of severe pain in the plantar heel left especially when getting up in the morning and after periods of sitting and now when walking and states she's had several cortisone injection she's had insoles pads and shoe gear modifications without relief and she is desperate for treatment as she is scheduled to go to [place] and needs to get better   Review of Systems  All other systems reviewed and are negative.      Objective:   Physical Exam  Constitutional: She is oriented to person, place, and time.  Cardiovascular: Intact distal pulses.   Musculoskeletal: Normal range of motion.  Neurological: She is oriented to person, place, and time.  Skin: Skin is warm.  Nursing note and vitals reviewed.  neurovascular status is intact with muscle strength adequate range of motion within normal limits. Patient's noted to have quite a bit of discomfort in the plantar aspect of the left heel at the insertional point of the tendon into the calcaneus with inflammation and fluid buildup around the medial band. Patient has intense discomfort when I pressed into the tissue today have good digital perfusion and is well oriented 3     Assessment:     Significant plantar fasciitis left with inflammation and fluid buildup    Plan:     H&P and x-rays reviewed with patient and today I went ahead and discussed treatment options and she states due to the intensity of discomfort and failure to respond to other conservative treatments that she wants surgery. I did review with her the difficulty of surgery and the fact there is no long-term guarantees and she understands this and is willing to accept risk. Today I went ahead and I allowed her to read consent form for correction going over all alternative treatments and complications and she is comfortable with this and signs  consent form after extensive review. Patient is scheduled for endoscopic release fascial band medial left and is dispensed air fracture walker with all instructions on usage

## 2014-09-16 NOTE — Patient Instructions (Signed)
Pre-Operative Instructions  Congratulations, you have decided to take an important step to improving your quality of life.  You can be assured that the doctors of [facility] will be with you every step of the way.  1. Plan to be at the surgery center/hospital at least 1 (one) hour prior to your scheduled time unless otherwise directed by the surgical center/hospital staff.  You must have a responsible adult accompany you, remain during the surgery and drive you home.  Make sure you have directions to the surgical center/hospital and know how to get there on time. 2. For hospital based surgery you will need to obtain a history and physical form from your family physician within 1 month prior to the date of surgery- we will give you a form for you primary physician.  3. We make every effort to accommodate the date you request for surgery.  There are however, times where surgery dates or times have to be moved.  We will contact you as soon as possible if a change in schedule is required.   4. No Aspirin/Ibuprofen for one week before surgery.  If you are on aspirin, any non-steroidal anti-inflammatory medications (Mobic, Aleve, Ibuprofen) you should stop taking it 7 days prior to your surgery.  You make take Tylenol  For pain prior to surgery.  5. Medications- If you are taking daily heart and blood pressure medications, seizure, reflux, allergy, asthma, anxiety, pain or diabetes medications, make sure the surgery center/hospital is aware before the day of surgery so they may notify you which medications to take or avoid the day of surgery. 6. No food or drink after midnight the night before surgery unless directed otherwise by surgical center/hospital staff. 7. No alcoholic beverages 24 hours prior to surgery.  No smoking 24 hours prior to or 24 hours after surgery. 8. Wear loose pants or shorts- loose enough to fit over bandages, boots, and casts. 9. No slip on shoes, sneakers are best. 10. Bring  your boot with you to the surgery center/hospital.  Also bring crutches or a walker if your physician has prescribed it for you.  If you do not have this equipment, it will be provided for you after surgery. 11. If you have not been contracted by the surgery center/hospital by the day before your surgery, call to confirm the date and time of your surgery. 12. Leave-time from work may vary depending on the type of surgery you have.  Appropriate arrangements should be made prior to surgery with your employer. 13. Prescriptions will be provided immediately following surgery by your doctor.  Have these filled as soon as possible after surgery and take the medication as directed. 14. Remove nail polish on the operative foot. 15. Wash the night before surgery.  The night before surgery wash the foot and leg well with the antibacterial soap provided and water paying special attention to beneath the toenails and in between the toes.  Rinse thoroughly with water and dry well with a towel.  Perform this wash unless told not to do so by your physician.  Enclosed: 1 Ice pack (please put in freezer the night before surgery)   1 Hibiclens skin cleaner   Pre-op Instructions  If you have any questions regarding the instructions, do not hesitate to call our office.  [place]: [address] [place], [place] [address] [phone]  [place]: [address] [place], [place] [address] [phone]  [place]: [address]  [place], [place] [address] [phone]  Dr. Richard   Tuchman DPM, Dr. Norman Regal DPM Dr. Richard Sikora DPM, Dr. M. Todd Hyatt DPM, Dr. Kathryn Egerton DPM 

## 2014-09-16 NOTE — Progress Notes (Signed)
   Subjective:    Patient ID: Kayla Medina, female    DOB: Dec 18, 1932, 81 y.o.   MRN: [id number]  HPI  Left heel pain since March 2 injections with no relief with a previous Dr     Review of Systems  All other systems reviewed and are negative.      Objective:   Physical Exam        Assessment & Plan:

## 2014-09-24 ENCOUNTER — Encounter: Payer: Self-pay | Admitting: Podiatry

## 2014-09-24 DIAGNOSIS — M722 Plantar fascial fibromatosis: Secondary | ICD-10-CM | POA: Diagnosis not present

## 2014-09-24 NOTE — Progress Notes (Signed)
Cardiology Office Note   Date:  09/25/2014   ID:  Kayla Medina, DOB Oct 12, 1932, MRN [id number]  PCP:  Marton Redwood, MD  Cardiologist:   Acie Fredrickson Wonda Cheng, MD   Chief Complaint  Patient presents with  . Follow-up    CHF   1. Takotsubo Syndrome 2.actue on chronic systolic CHF - EF 10-17%  3. Hypertension 4. Diabetes mellitus  History of Present Illness: Kayla Medina is a 81 y.o. female who presents for f/u of her mild MI - Takotsubo syndrome. Feels weak, no CP, Breathing is ok. Has some DOE .   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    History of Present Illness: Kayla Medina is a 81 y.o. female who presents for follow up of her Takotsubo.  BP at home is ok.  A bit elevated here. 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   September 10, 2014: Kayla Medina EF has fallen ( unexpectedly) .  She was diagnosed with Takotsubo  syndrome and has been on a beta blocker and angiotensin blocker since that time. Her ejection fraction has fallen to 25-30%.  She was recently seen by Dr. Brigitte Pulse and had some hypertension. Amlodipine was added to her medical regimen at that time.  September 25, 2014:  Kayla Medina is seen back today for follow up of her severe LV dysfunction She was started on valsartan ( instead of Losartan ) during her last visit. We increase her Coreg and DC'd the Amlodipine . Her BP has been better.  Breathing is better.   She had foot surgery 2 days ago.  Is getting along ok     Past Medical History  Diagnosis Date  . Chest pain, unspecified   . Other and unspecified hyperlipidemia   . Shortness of breath   . Palpitations   . Shingles     hx  . Diabetes mellitus without complication   . Hypertension   . Rosacea   . Anal fissure   . Pneumonia   . PVC (premature ventricular contraction)   . Glaucoma     pt. states no-glaucoma 07/20/13  . Subclinical hypothyroidism     Past Surgical History  Procedure Laterality Date  . Bladder repair    . Bowel  resection    . Bilateral eye surgery/cataract repair  3/09  . Total abdominal hysterectomy    . Appendectomy    . Left heart catheterization with coronary angiogram N/A 04/24/2014    Procedure: LEFT HEART CATHETERIZATION WITH CORONARY ANGIOGRAM;  Surgeon: Wellington Hampshire, MD;  Location: [facility];  Service: Cardiovascular;  Laterality: N/A;     Current Outpatient Prescriptions  Medication Sig Dispense Refill  . ALPHA LIPOIC ACID PO Take 100 mg by mouth daily.    Marland Kitchen ALPRAZolam (XANAX) 0.5 MG tablet Take 0.5 mg by mouth at bedtime as needed for anxiety.    Marland Kitchen aspirin EC 81 MG EC tablet Take 1 tablet (81 mg total) by mouth daily. 30 tablet 0  . atorvastatin (LIPITOR) 10 MG tablet Take 10 mg by mouth daily.    . carvedilol (COREG) 6.25 MG tablet Take 2 tablets (12.5 mg total) by mouth 2 (two) times daily with a meal. 60 tablet 11  . cholecalciferol (VITAMIN D) 1000 UNITS tablet Take 1,000 Units by mouth daily.    . Coenzyme Q10 (CO Q-10) 30 MG CAPS Take 30 mg by mouth daily.     Marland Kitchen desonide (DESOWEN) 0.05 %  cream Apply 1 application topically 2 (two) times daily as needed (for itching/dry skin).    Marland Kitchen guaiFENesin (MUCINEX) 600 MG 12 hr tablet Take 1 tablet (600 mg total) by mouth 2 (two) times daily as needed for cough or to loosen phlegm.    . meclizine (ANTIVERT) 25 MG tablet Take 1 tablet (25 mg total) by mouth 3 (three) times daily as needed for dizziness. 30 tablet 0  . metFORMIN (GLUCOPHAGE) 1000 MG tablet Take 1 tablet (1,000 mg total) by mouth 2 (two) times daily.    . metroNIDAZOLE (METROCREAM) 0.75 % cream Apply 1 application topically 2 (two) times daily.    . montelukast (SINGULAIR) 10 MG tablet Take 10 mg by mouth at bedtime.     Marland Kitchen omeprazole (PRILOSEC) 20 MG capsule Take 20 mg by mouth daily.    . valsartan (DIOVAN) 320 MG tablet Take 1 tablet (320 mg total) by mouth daily. 31 tablet 11  . vitamin B-12 (CYANOCOBALAMIN) 1000 MCG tablet Take 1,000 mcg by mouth daily.    . vitamin C  (ASCORBIC ACID) 500 MG tablet Take 500 mg by mouth daily.    . vitamin E 600 UNIT capsule Take 400 Units by mouth daily.      No current facility-administered medications for this visit.    Allergies:   Codeine; Januvia; Other; Penicillins; and Sulfonamide derivatives    Social History:  The patient  reports that she has never smoked. She has never used smokeless tobacco. She reports that she does not drink alcohol or use illicit drugs.   Family History:  The patient's family history includes Alcoholism in her father; Alzheimer's disease in her mother; Diabetes in her daughter, maternal grandmother, and paternal grandmother; Hypertension in her mother; Other in her sister; Prostate cancer in her brother; Seizures in her sister; Thyroid disease in her brother, daughter, mother, and son.    ROS:  Please see the history of present illness.    Review of Systems: Constitutional:  denies fever, chills, diaphoresis, appetite change and fatigue.  HEENT: denies photophobia, eye pain, redness, hearing loss, ear pain, congestion, sore throat, rhinorrhea, sneezing, neck pain, neck stiffness and tinnitus.  Respiratory: denies SOB, DOE, cough, chest tightness, and wheezing.  Cardiovascular: denies chest pain, palpitations and leg swelling.  Gastrointestinal: denies nausea, vomiting, abdominal pain, diarrhea, constipation, blood in stool.  Genitourinary: denies dysuria, urgency, frequency, hematuria, flank pain and difficulty urinating.  Musculoskeletal: denies  myalgias, back pain, joint swelling, arthralgias and gait problem.   Skin: denies pallor, rash and wound.  Neurological: denies dizziness, seizures, syncope, weakness, light-headedness, numbness and headaches.   Hematological: denies adenopathy, easy bruising, personal or family bleeding history.  Psychiatric/ Behavioral: denies suicidal ideation, mood changes, confusion, nervousness, sleep disturbance and agitation.       All other systems  are reviewed and negative.    PHYSICAL EXAM: VS:  BP 132/60 mmHg  Pulse 67  Ht 5\' 6"  (1.676 m)  Wt 73.483 kg (162 lb)  BMI 26.16 kg/m2 , BMI Body mass index is 26.16 kg/(m^2). GEN: Well nourished, well developed, in no acute distress HEENT: normal Neck: no JVD, carotid bruits, or masses Cardiac: RRR; no murmurs, rubs, or gallops,no edema  Respiratory:  clear to auscultation bilaterally, normal work of breathing GI: soft, nontender, nondistended, + BS MS: no deformity or atrophy Skin: warm and dry, no rash Neuro:  Strength and sensation are intact Psych: normal   EKG:  EKG is not ordered today.    Recent Labs:  04/23/2014: TSH 5.257* 04/25/2014: ALT 12; Hemoglobin 11.4*; Platelets 334 06/12/2014: BUN 21; Creatinine, Ser 1.14; Potassium 4.2; Sodium 138    Lipid Panel    Component Value Date/Time   CHOL 231* 04/23/2014 0045   TRIG 297* 04/23/2014 0045   HDL 45 04/23/2014 0045   CHOLHDL 5.1 04/23/2014 0045   VLDL 59* 04/23/2014 0045   LDLCALC 127* 04/23/2014 0045      Wt Readings from Last 3 Encounters:  09/25/14 73.483 kg (162 lb)  09/10/14 73.755 kg (162 lb 9.6 oz)  06/12/14 73.483 kg (162 lb)      Other studies Reviewed: Additional studies/ records that were reviewed today include: . Review of the above records demonstrates:    ASSESSMENT AND PLAN:  1.  Takotsubo syndrome:  Chronic systolic congestive heart failure: The patient presented to the hospital 3 months ago with Takotsubo  syndrome.   Her left ventricle systolic function has decreased to EF of 25-30%.   Will continue to titrate her Coreg up - will in crease to 12. 5 BID . Will see her back in 1 month.   2. Essential hypertension:  Blood pressure is much better . Will increase the Coreg for her chronic systolic CHF .   Current medicines are reviewed at length with the patient today.  The patient does not have concerns regarding medicines. The following changes have been made:  Increase coreg to 12.  5 bid   Labs/ tests ordered today include:  No orders of the defined types were placed in this encounter.     Disposition:   FU with me in 1 month     Signed, Trell Secrist, Wonda Cheng, MD  09/25/2014 10:08 AM    [facility] [address], [place], [place]  [address] Phone: [phone]; Fax: [phone]

## 2014-09-25 ENCOUNTER — Encounter: Payer: Self-pay | Admitting: Cardiovascular Disease

## 2014-09-25 ENCOUNTER — Ambulatory Visit (INDEPENDENT_AMBULATORY_CARE_PROVIDER_SITE_OTHER): Payer: Medicare Other | Admitting: Cardiovascular Disease

## 2014-09-25 VITALS — BP 132/60 | HR 67 | Ht 66.0 in | Wt 162.0 lb

## 2014-09-25 DIAGNOSIS — I10 Essential (primary) hypertension: Secondary | ICD-10-CM | POA: Diagnosis not present

## 2014-09-25 DIAGNOSIS — I5023 Acute on chronic systolic (congestive) heart failure: Secondary | ICD-10-CM | POA: Diagnosis not present

## 2014-09-25 DIAGNOSIS — I5181 Takotsubo syndrome: Secondary | ICD-10-CM

## 2014-09-25 MED ORDER — CARVEDILOL 6.25 MG PO TABS: 12.5000 mg | ORAL_TABLET | Freq: Two times a day (BID) | ORAL | Status: DC

## 2014-09-25 NOTE — Patient Instructions (Addendum)
Medication Instructions:  INCREASE Coreg (Carvedilol) to 12.5 mg twice daily   Labwork: None Ordered  Testing/Procedures: None Ordered  Follow-Up: Your physician recommends that you schedule a follow-up appointment in: 1 month - Wednesday August 10

## 2014-10-01 ENCOUNTER — Ambulatory Visit (INDEPENDENT_AMBULATORY_CARE_PROVIDER_SITE_OTHER): Payer: Medicare Other | Admitting: Podiatry

## 2014-10-01 ENCOUNTER — Encounter: Payer: Self-pay | Admitting: Podiatry

## 2014-10-01 VITALS — BP 136/82 | HR 67 | Resp 15

## 2014-10-01 DIAGNOSIS — M722 Plantar fascial fibromatosis: Secondary | ICD-10-CM

## 2014-10-02 NOTE — Progress Notes (Signed)
Subjective:     Patient ID: Kayla Medina, female   DOB: 04/29/1932, 81 y.o.   MRN: [id number]  HPI patient states my heel is feeling quite a bit better and I'm satisfied with the surgery with minimal swelling pain   Review of Systems     Objective:   Physical Exam Neurovascular status intact with incision well coapted medial lateral side with wound edges doing well and stitches intact with minimal plantar pain    Assessment:     Doing well post endoscopic surgery left    Plan:     Reviewed condition and at this time reapplied sterile dressing dispensed surgical shoes and advised on continued elevation. Negative Homan sign was noted and reviewed stitch removal in 2 weeks and to come in earlier if any issues should occur

## 2014-10-15 ENCOUNTER — Ambulatory Visit (INDEPENDENT_AMBULATORY_CARE_PROVIDER_SITE_OTHER): Payer: Medicare Other

## 2014-10-15 DIAGNOSIS — M722 Plantar fascial fibromatosis: Secondary | ICD-10-CM

## 2014-10-15 NOTE — Progress Notes (Signed)
Patient ID: Kayla Medina, female   DOB: October 12, 1932, 81 y.o.   MRN: [id number] Pt presents status post op 2 weeks from left medial band EPF. She states that she is still having some numbness on the plantar aspect of her left foot with intermittent mild pain and swelling. She is currently in a Darco shoe with silicone heel lift. She states that she is doing and feeling well overall. Sutures were removed, steri-strips applied. She was given a compression anklet and advise to wear it along with slowly transitioning in to regular shoe gear. Gradual increase in activity was advised. She is to call with any questions or concerns or change in status. Reappointed to return in 3 weeks

## 2014-10-16 NOTE — Progress Notes (Unsigned)
DOS 09/24/2014 Endoscopic plantar fasciotomy medial band.

## 2014-10-30 ENCOUNTER — Encounter: Payer: Self-pay | Admitting: Cardiovascular Disease

## 2014-10-30 ENCOUNTER — Ambulatory Visit (INDEPENDENT_AMBULATORY_CARE_PROVIDER_SITE_OTHER): Payer: Medicare Other | Admitting: Cardiovascular Disease

## 2014-10-30 VITALS — BP 110/60 | HR 67 | Ht 66.0 in | Wt 164.8 lb

## 2014-10-30 DIAGNOSIS — I5023 Acute on chronic systolic (congestive) heart failure: Secondary | ICD-10-CM

## 2014-10-30 DIAGNOSIS — I10 Essential (primary) hypertension: Secondary | ICD-10-CM

## 2014-10-30 DIAGNOSIS — I5022 Chronic systolic (congestive) heart failure: Secondary | ICD-10-CM

## 2014-10-30 DIAGNOSIS — I5181 Takotsubo syndrome: Secondary | ICD-10-CM | POA: Diagnosis not present

## 2014-10-30 MED ORDER — CARVEDILOL 12.5 MG PO TABS: 12.5000 mg | ORAL_TABLET | Freq: Two times a day (BID) | ORAL | Status: DC

## 2014-10-30 MED ORDER — ATORVASTATIN CALCIUM 10 MG PO TABS: 10.0000 mg | ORAL_TABLET | Freq: Every day | ORAL | Status: DC

## 2014-10-30 MED ORDER — VALSARTAN 80 MG PO TABS: 80.0000 mg | ORAL_TABLET | Freq: Every day | ORAL | Status: DC

## 2014-10-30 NOTE — Progress Notes (Signed)
Cardiology Office Note   Date:  10/30/2014   ID:  Kayla Medina, Kayla Medina 1933/03/05, MRN [id number]  PCP:  Marton Redwood, MD  Cardiologist:   Acie Fredrickson Wonda Cheng, MD   Chief Complaint  Patient presents with  . Follow-up    CAD, Takotsubo syndrome   1. Takotsubo Syndrome 2.actue on chronic systolic CHF - EF 82-99%  3. Hypertension 4. Diabetes mellitus  History of Present Illness: Kayla Medina is a 81 y.o. female who presents for f/u of her mild MI - Takotsubo syndrome. Feels weak, no CP, Breathing is ok. Has some DOE .   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    History of Present Illness: Kayla Medina is a 81 y.o. female who presents for follow up of her Takotsubo.  BP at home is ok.  A bit elevated here. 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   September 10, 2014: Kayla Medina EF has fallen ( unexpectedly) .  She was diagnosed with Takotsubo  syndrome and has been on a beta blocker and angiotensin blocker since that time. Her ejection fraction has fallen to 25-30%.  She was recently seen by Dr. Brigitte Pulse and had some hypertension. Amlodipine was added to her medical regimen at that time.  September 25, 2014:  Kayla Medina is seen back today for follow up of her severe LV dysfunction She was started on valsartan ( instead of Losartan ) during her last visit. We increase her Coreg and DC'd the Amlodipine . Her BP has been better.  Breathing is better.   She had foot surgery 2 days ago.  Is getting along ok    October 30, 2014:   She is doing ok. No CP or dyspnea.  She had a stress    Past Medical History  Diagnosis Date  . Chest pain, unspecified   . Other and unspecified hyperlipidemia   . Shortness of breath   . Palpitations   . Shingles     hx  . Diabetes mellitus without complication   . Hypertension   . Rosacea   . Anal fissure   . Pneumonia   . PVC (premature ventricular contraction)   . Glaucoma     pt. states no-glaucoma 07/20/13  . Subclinical  hypothyroidism     Past Surgical History  Procedure Laterality Date  . Bladder repair    . Bowel resection    . Bilateral eye surgery/cataract repair  3/09  . Total abdominal hysterectomy    . Appendectomy    . Left heart catheterization with coronary angiogram N/A 04/24/2014    Procedure: LEFT HEART CATHETERIZATION WITH CORONARY ANGIOGRAM;  Surgeon: Wellington Hampshire, MD;  Location: [facility];  Service: Cardiovascular;  Laterality: N/A;     Current Outpatient Prescriptions  Medication Sig Dispense Refill  . ALPHA LIPOIC ACID PO Take 100 mg by mouth daily.    Marland Kitchen ALPRAZolam (XANAX) 0.5 MG tablet Take 0.5 mg by mouth at bedtime as needed for anxiety.    Marland Kitchen aspirin EC 81 MG EC tablet Take 1 tablet (81 mg total) by mouth daily. 30 tablet 0  . atorvastatin (LIPITOR) 10 MG tablet Take 10 mg by mouth daily.    . carvedilol (COREG) 6.25 MG tablet Take 2 tablets (12.5 mg total) by mouth 2 (two) times daily with a meal. 60 tablet 11  . Coenzyme Q10 (CO Q-10) 30 MG CAPS Take 30 mg by mouth daily.     Marland Kitchen  desonide (DESOWEN) 0.05 % cream Apply 1 application topically 2 (two) times daily as needed (for itching/dry skin).    . meclizine (ANTIVERT) 25 MG tablet Take 1 tablet (25 mg total) by mouth 3 (three) times daily as needed for dizziness. 30 tablet 0  . metFORMIN (GLUCOPHAGE) 1000 MG tablet Take 1 tablet (1,000 mg total) by mouth 2 (two) times daily.    . metroNIDAZOLE (METROCREAM) 0.75 % cream Apply 1 application topically 2 (two) times daily.    . vitamin B-12 (CYANOCOBALAMIN) 1000 MCG tablet Take 1,000 mcg by mouth daily.    . vitamin C (ASCORBIC ACID) 500 MG tablet Take 500 mg by mouth daily.    . vitamin E 600 UNIT capsule Take 400 Units by mouth daily.     Marland Kitchen guaiFENesin (MUCINEX) 600 MG 12 hr tablet Take 1 tablet (600 mg total) by mouth 2 (two) times daily as needed for cough or to loosen phlegm. (Patient not taking: Reported on 10/30/2014)    . valsartan (DIOVAN) 320 MG tablet Take 1 tablet (320  mg total) by mouth daily. (Patient not taking: Reported on 10/30/2014) 31 tablet 11   No current facility-administered medications for this visit.    Allergies:   Codeine; Januvia; Other; Penicillins; and Sulfonamide derivatives    Social History:  The patient  reports that she has never smoked. She has never used smokeless tobacco. She reports that she does not drink alcohol or use illicit drugs.   Family History:  The patient's family history includes Alcoholism in her father; Alzheimer's disease in her mother; Diabetes in her daughter, maternal grandmother, and paternal grandmother; Hypertension in her mother; Other in her sister; Prostate cancer in her brother; Seizures in her sister; Thyroid disease in her brother, daughter, mother, and son.    ROS:  Please see the history of present illness.    Review of Systems: Constitutional:  denies fever, chills, diaphoresis, appetite change and fatigue.  HEENT: denies photophobia, eye pain, redness, hearing loss, ear pain, congestion, sore throat, rhinorrhea, sneezing, neck pain, neck stiffness and tinnitus.  Respiratory: denies SOB, DOE, cough, chest tightness, and wheezing.  Cardiovascular: denies chest pain, palpitations and leg swelling.  Gastrointestinal: denies nausea, vomiting, abdominal pain, diarrhea, constipation, blood in stool.  Genitourinary: denies dysuria, urgency, frequency, hematuria, flank pain and difficulty urinating.  Musculoskeletal: denies  myalgias, back pain, joint swelling, arthralgias and gait problem.   Skin: denies pallor, rash and wound.  Neurological: denies dizziness, seizures, syncope, weakness, light-headedness, numbness and headaches.   Hematological: denies adenopathy, easy bruising, personal or family bleeding history.  Psychiatric/ Behavioral: denies suicidal ideation, mood changes, confusion, nervousness, sleep disturbance and agitation.       All other systems are reviewed and negative.    PHYSICAL  EXAM: VS:  BP 110/60 mmHg  Pulse 67  Ht 5\' 6"  (1.676 m)  Wt 74.753 kg (164 lb 12.8 oz)  BMI 26.61 kg/m2  SpO2 95% , BMI Body mass index is 26.61 kg/(m^2). GEN: Well nourished, well developed, in no acute distress HEENT: normal Neck: no JVD, carotid bruits, or masses Cardiac: RRR; no murmurs, rubs, or gallops,no edema  Respiratory:  clear to auscultation bilaterally, normal work of breathing GI: soft, nontender, nondistended, + BS MS: no deformity or atrophy Skin: warm and dry, no rash Neuro:  Strength and sensation are intact Psych: normal   EKG:  EKG is not ordered today.    Recent Labs: 04/23/2014: TSH 5.257* 04/25/2014: ALT 12; Hemoglobin 11.4*; Platelets 334 06/12/2014:  BUN 21; Creatinine, Ser 1.14; Potassium 4.2; Sodium 138    Lipid Panel    Component Value Date/Time   CHOL 231* 04/23/2014 0045   TRIG 297* 04/23/2014 0045   HDL 45 04/23/2014 0045   CHOLHDL 5.1 04/23/2014 0045   VLDL 59* 04/23/2014 0045   LDLCALC 127* 04/23/2014 0045      Wt Readings from Last 3 Encounters:  10/30/14 74.753 kg (164 lb 12.8 oz)  09/25/14 73.483 kg (162 lb)  09/10/14 73.755 kg (162 lb 9.6 oz)      Other studies Reviewed: Additional studies/ records that were reviewed today include: . Review of the above records demonstrates:    ASSESSMENT AND PLAN:  1.  Takotsubo syndrome:  Chronic systolic congestive heart failure: The patient presented to the hospital 3 months ago with Takotsubo  syndrome.   Her left ventricle systolic function has decreased to EF of 25-30%.   She's now on carvedilol 12.5 mg twice a day. She stopped her valsartan 1 month after we started. Her blood pressure is fairly normal. We will restart the valsartan at 80 mg a day.  I'll see her in 3 weeks for a follow-up basic medical profile. I'll see her in 3 months for follow-up visit. We will to make getting an echocardiogram later this year or early next year.  She is very hesitant to see me more often than that.     If her LV function does not improve then she may need to be referred for ICD placement.  2. Essential hypertension:  Blood pressure is much better . Will increase the Coreg for her chronic systolic CHF .   Current medicines are reviewed at length with the patient today.  The patient does not have concerns regarding medicines. The following changes have been made:  Increase coreg to 12. 5 bid   Labs/ tests ordered today include:  No orders of the defined types were placed in this encounter.     Disposition:   FU with me in 3 months     Signed, Afua Hoots, Wonda Cheng, MD  10/30/2014 9:43 AM    [facility] [address], [place], [place]  [address] Phone: [phone]; Fax: [phone]

## 2014-10-30 NOTE — Patient Instructions (Signed)
Medication Instructions:  INCREASE Carvedilol (Coreg) to 12.5 mg twice daily RESTART Valsartan 80 mg once daily  Labwork: Your physician recommends that you return for lab work in: 3 weeks for basic metabolic panel   Testing/Procedures: None Ordered   Follow-Up: Your physician recommends that you schedule a follow-up appointment in: 3 months with Dr. Acie Fredrickson

## 2014-11-05 ENCOUNTER — Ambulatory Visit (INDEPENDENT_AMBULATORY_CARE_PROVIDER_SITE_OTHER): Payer: Medicare Other

## 2014-11-05 VITALS — BP 136/53 | HR 69 | Resp 16

## 2014-11-05 DIAGNOSIS — M722 Plantar fascial fibromatosis: Secondary | ICD-10-CM

## 2014-11-05 NOTE — Progress Notes (Signed)
   Subjective:    Patient ID: Kayla Medina, female    DOB: December 13, 1932, 81 y.o.   MRN: [id number]  HPI  Pt presents post op DOS 09/24/14 Left EPF, pt is doing well. She is still having some numbness along lateral side of foot. Turning foot inward causes some pain but overall she is doing well post operatively. Advised her to continue gradually becoming more active, she is currently in regular shoe. She is to call and followup if problems occur  Review of Systems     Objective:   Physical Exam        Assessment & Plan:

## 2014-11-20 ENCOUNTER — Other Ambulatory Visit (INDEPENDENT_AMBULATORY_CARE_PROVIDER_SITE_OTHER): Payer: Medicare Other | Admitting: *Deleted

## 2014-11-20 DIAGNOSIS — I5022 Chronic systolic (congestive) heart failure: Secondary | ICD-10-CM

## 2014-11-20 LAB — BASIC METABOLIC PANEL
BUN: 21 mg/dL (ref 6–23)
CHLORIDE: 105 meq/L (ref 96–112)
CO2: 27 mEq/L (ref 19–32)
Calcium: 9.4 mg/dL (ref 8.4–10.5)
Creatinine, Ser: 0.94 mg/dL (ref 0.40–1.20)
GFR: 60.84 mL/min (ref >60.00)
Glucose, Bld: 203 mg/dL — ABNORMAL HIGH (ref 70–99)
POTASSIUM: 3.9 meq/L (ref 3.5–5.1)
Sodium: 139 mEq/L (ref 135–145)

## 2014-11-20 NOTE — Addendum Note (Signed)
Addended by: Eulis Foster on: 11/20/2014 09:42 AM   Modules accepted: Orders

## 2015-01-22 ENCOUNTER — Other Ambulatory Visit: Payer: Self-pay | Admitting: Cardiovascular Disease

## 2015-02-17 ENCOUNTER — Encounter: Payer: Self-pay | Admitting: Cardiovascular Disease

## 2015-02-17 ENCOUNTER — Ambulatory Visit (INDEPENDENT_AMBULATORY_CARE_PROVIDER_SITE_OTHER): Payer: Medicare Other | Admitting: Cardiovascular Disease

## 2015-02-17 VITALS — BP 150/75 | HR 67 | Ht 66.0 in | Wt 164.0 lb

## 2015-02-17 DIAGNOSIS — I10 Essential (primary) hypertension: Secondary | ICD-10-CM | POA: Diagnosis not present

## 2015-02-17 DIAGNOSIS — I5022 Chronic systolic (congestive) heart failure: Secondary | ICD-10-CM | POA: Diagnosis not present

## 2015-02-17 DIAGNOSIS — I5181 Takotsubo syndrome: Secondary | ICD-10-CM

## 2015-02-17 DIAGNOSIS — I5023 Acute on chronic systolic (congestive) heart failure: Secondary | ICD-10-CM

## 2015-02-17 DIAGNOSIS — I447 Left bundle-branch block, unspecified: Secondary | ICD-10-CM | POA: Diagnosis not present

## 2015-02-17 MED ORDER — SPIRONOLACTONE 25 MG PO TABS: 12.5000 mg | ORAL_TABLET | Freq: Every day | ORAL | Status: DC

## 2015-02-17 NOTE — Progress Notes (Signed)
Cardiology Office Note   Date:  02/17/2015   ID:  Kayla Medina, Kayla Medina May 21, 1932, MRN [id number]  PCP:  Kayla Redwood, MD  Cardiologist:   Kayla Fredrickson Medina Cheng, MD   Chief Complaint  Patient presents with  . Follow-up    CHF   1. Takotsubo Syndrome 2.actue on chronic systolic CHF - EF 123XX123  3. Hypertension 4. Diabetes mellitus 5. LBBB    Kayla Medina is a 81 y.o. female who presents for f/u of her mild MI - Takotsubo syndrome. Feels weak, no CP, Breathing is ok. Has some DOE .   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     Kayla Medina is a 81 y.o. female who presents for follow up of her Takotsubo.  BP at home is ok.  A bit elevated here. 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   September 10, 2014: Ms. Mcfadyen EF has fallen ( unexpectedly) .  She was diagnosed with Takotsubo  syndrome and has been on a beta blocker and angiotensin blocker since that time. Her ejection fraction has fallen to 25-30%.  She was recently seen by Dr. Brigitte Medina and had some hypertension. Amlodipine was added to her medical regimen at that time.  September 25, 2014:  Ms. Baye is seen back today for follow up of her severe LV dysfunction She was started on valsartan ( instead of Losartan ) during her last visit. We increase her Coreg and DC'd the Amlodipine . Her BP has been better.  Breathing is better.   She had foot surgery 2 days ago.  Is getting along ok    October 30, 2014:   She is doing ok. No CP or dyspnea.   Nov. 28, 2016:  Kayla Medina is seen today for follow up of her Takotsubo syndrome and chronic Systolic CHF. Hx of HTN BP is up a bit today -  Ate some ham over Thanksgiving . She is short of breath at times.      Past Medical History  Diagnosis Date  . Chest pain, unspecified   . Other and unspecified hyperlipidemia   . Shortness of breath   . Palpitations   . Shingles     hx  . Diabetes mellitus without complication   . Hypertension   . Rosacea   . Anal fissure    . Pneumonia   . PVC (premature ventricular contraction)   . Glaucoma     pt. states no-glaucoma 07/20/13  . Subclinical hypothyroidism     Past Surgical History  Procedure Laterality Date  . Bladder repair    . Bowel resection    . Bilateral eye surgery/cataract repair  3/09  . Total abdominal hysterectomy    . Appendectomy    . Left heart catheterization with coronary angiogram N/A 04/24/2014    Procedure: LEFT HEART CATHETERIZATION WITH CORONARY ANGIOGRAM;  Surgeon: Wellington Hampshire, MD;  Location: [facility];  Service: Cardiovascular;  Laterality: N/A;     Current Outpatient Prescriptions  Medication Sig Dispense Refill  . ALPHA LIPOIC ACID PO Take 100 mg by mouth daily.    Marland Kitchen ALPRAZolam (XANAX) 0.5 MG tablet Take 0.5 mg by mouth at bedtime as needed for anxiety.    Marland Kitchen aspirin EC 81 MG EC tablet Take 1 tablet (81 mg total) by mouth daily. 30 tablet 0  . atorvastatin (LIPITOR) 10 MG tablet Take 1 tablet (10 mg total) by mouth daily. 90 tablet 3  .  carvedilol (COREG) 12.5 MG tablet Take 1 tablet (12.5 mg total) by mouth 2 (two) times daily with a meal. 180 tablet 3  . Coenzyme Q10 (CO Q-10) 30 MG CAPS Take 30 mg by mouth daily.     Marland Kitchen desonide (DESOWEN) 0.05 % cream Apply 1 application topically 2 (two) times daily as needed (for itching/dry skin).    Marland Kitchen guaiFENesin (MUCINEX) 600 MG 12 hr tablet Take 1 tablet (600 mg total) by mouth 2 (two) times daily as needed for cough or to loosen phlegm.    . meclizine (ANTIVERT) 25 MG tablet Take 1 tablet (25 mg total) by mouth 3 (three) times daily as needed for dizziness. 30 tablet 0  . metFORMIN (GLUCOPHAGE) 1000 MG tablet Take 1 tablet (1,000 mg total) by mouth 2 (two) times daily.    . metroNIDAZOLE (METROCREAM) 0.75 % cream Apply 1 application topically 2 (two) times daily.    . valsartan (DIOVAN) 80 MG tablet TAKE ONE TABLET BY MOUTH ONCE DAILY 90 tablet 2  . vitamin B-12 (CYANOCOBALAMIN) 1000 MCG tablet Take 1,000 mcg by mouth daily.    .  vitamin C (ASCORBIC ACID) 500 MG tablet Take 500 mg by mouth daily.    . vitamin E 600 UNIT capsule Take 400 Units by mouth daily.      No current facility-administered medications for this visit.    Allergies:   Codeine; Januvia; Other; Penicillins; and Sulfonamide derivatives    Social History:  The patient  reports that she has never smoked. She has never used smokeless tobacco. She reports that she does not drink alcohol or use illicit drugs.   Family History:  The patient's family history includes Alcoholism in her father; Alzheimer's disease in her mother; Diabetes in her daughter, maternal grandmother, and paternal grandmother; Hypertension in her mother; Other in her sister; Prostate cancer in her brother; Seizures in her sister; Thyroid disease in her brother, daughter, mother, and son.    ROS:  Please see the history of present illness.    Review of Systems: Constitutional:  denies fever, chills, diaphoresis, appetite change and fatigue.  HEENT: denies photophobia, eye pain, redness, hearing loss, ear pain, congestion, sore throat, rhinorrhea, sneezing, neck pain, neck stiffness and tinnitus.  Respiratory: denies SOB, DOE, cough, chest tightness, and wheezing.  Cardiovascular: denies chest pain, palpitations and leg swelling.  Gastrointestinal: denies nausea, vomiting, abdominal pain, diarrhea, constipation, blood in stool.  Genitourinary: denies dysuria, urgency, frequency, hematuria, flank pain and difficulty urinating.  Musculoskeletal: denies  myalgias, back pain, joint swelling, arthralgias and gait problem.   Skin: denies pallor, rash and wound.  Neurological: denies dizziness, seizures, syncope, weakness, light-headedness, numbness and headaches.   Hematological: denies adenopathy, easy bruising, personal or family bleeding history.  Psychiatric/ Behavioral: denies suicidal ideation, mood changes, confusion, nervousness, sleep disturbance and agitation.       All  other systems are reviewed and negative.    PHYSICAL EXAM: VS:  There were no vitals taken for this visit. , BMI There is no weight on file to calculate BMI. GEN: Well nourished, well developed, in no acute distress HEENT: normal Neck: no JVD, carotid bruits, or masses Cardiac: RRR; no murmurs, rubs, or gallops,no edema  Respiratory:  clear to auscultation bilaterally, normal work of breathing GI: soft, nontender, nondistended, + BS MS: no deformity or atrophy Skin: warm and dry, no rash Neuro:  Strength and sensation are intact Psych: normal   EKG:  EKG is ordered today. NSR at 67, LBBB  Recent Labs: 04/23/2014: TSH 5.257* 04/25/2014: ALT 12; Hemoglobin 11.4*; Platelets 334 11/20/2014: BUN 21; Creatinine, Ser 0.94; Potassium 3.9; Sodium 139    Lipid Panel    Component Value Date/Time   CHOL 231* 04/23/2014 0045   TRIG 297* 04/23/2014 0045   HDL 45 04/23/2014 0045   CHOLHDL 5.1 04/23/2014 0045   VLDL 59* 04/23/2014 0045   LDLCALC 127* 04/23/2014 0045      Wt Readings from Last 3 Encounters:  10/30/14 164 lb 12.8 oz (74.753 kg)  09/25/14 162 lb (73.483 kg)  09/10/14 162 lb 9.6 oz (73.755 kg)      Other studies Reviewed: Additional studies/ records that were reviewed today include: . Review of the above records demonstrates:    ASSESSMENT AND PLAN:  1.  Takotsubo syndrome:    2.  Chronic systolic congestive heart failure: The patient presented to the hospital   with Takotsubo  syndrome.   Her left ventricle systolic function has decreased to EF of 25-30%.   We have added Aldactone 12.5 mg a day. We will check a basic medical profile and have her return for a nurse visit, blood pressure and heart rate check in one week. I'll see her again in 6 weeks for follow-up visit. Once we maximized her medications we'll check an echocardiogram. If her ejection fraction is still less than 35% then we will refer her to EP for consideration for a biventricular pacer.  3.  Essential hypertension:  Blood pressure is a bit high.  Added Aldactone.       Current medicines are reviewed at length with the patient today.  The patient does not have concerns regarding medicines.    Labs/ tests ordered today include:  No orders of the defined types were placed in this encounter.     Disposition:   FU with me in 6 weeks.      Signed, Muhammadali Ries, Medina Cheng, MD  02/17/2015 8:08 AM    [facility] [place], [place], [place]  [address] Phone: [phone]; Fax: [phone]

## 2015-02-17 NOTE — Patient Instructions (Signed)
Medication Instructions:  START Aldactone 12.5 mg once daily   Labwork: Your physician recommends that you return for lab work in: 1 week on the same as your Nurse Visit   Testing/Procedures: None Ordered   Follow-Up: Your physician recommends that you schedule a follow-up appointment in: 1 week for Nurse visit/BP check  Your physician recommends that you schedule a follow-up appointment in: 6 weeks with Dr. Acie Fredrickson   If you need a refill on your cardiac medications before your next appointment, please call your pharmacy.   Thank you for choosing [facility]! Christen Bame, RN [phone]

## 2015-02-18 ENCOUNTER — Other Ambulatory Visit: Payer: Medicare Other

## 2015-02-25 ENCOUNTER — Other Ambulatory Visit: Payer: Medicare Other

## 2015-02-25 ENCOUNTER — Ambulatory Visit: Payer: Medicare Other | Admitting: Pharmacist

## 2015-02-26 ENCOUNTER — Ambulatory Visit (INDEPENDENT_AMBULATORY_CARE_PROVIDER_SITE_OTHER): Payer: Medicare Other | Admitting: Pharmacist

## 2015-02-26 ENCOUNTER — Other Ambulatory Visit: Payer: Self-pay | Admitting: *Deleted

## 2015-02-26 ENCOUNTER — Other Ambulatory Visit (INDEPENDENT_AMBULATORY_CARE_PROVIDER_SITE_OTHER): Payer: Medicare Other | Admitting: *Deleted

## 2015-02-26 VITALS — BP 134/72 | HR 74

## 2015-02-26 DIAGNOSIS — I10 Essential (primary) hypertension: Secondary | ICD-10-CM

## 2015-02-26 DIAGNOSIS — I5022 Chronic systolic (congestive) heart failure: Secondary | ICD-10-CM | POA: Diagnosis not present

## 2015-02-26 DIAGNOSIS — I447 Left bundle-branch block, unspecified: Secondary | ICD-10-CM

## 2015-02-26 LAB — BASIC METABOLIC PANEL
BUN: 16 mg/dL (ref 7–25)
CALCIUM: 9.2 mg/dL (ref 8.6–10.4)
CO2: 24 mmol/L (ref 20–31)
Chloride: 105 mmol/L (ref 98–110)
Creat: 0.97 mg/dL — ABNORMAL HIGH (ref 0.60–0.88)
GLUCOSE: 219 mg/dL — AB (ref 65–99)
POTASSIUM: 4.1 mmol/L (ref 3.5–5.3)
SODIUM: 139 mmol/L (ref 135–146)

## 2015-02-26 NOTE — Patient Instructions (Signed)
Your blood pressure looks great.   We will call you with your lab results.

## 2015-02-26 NOTE — Addendum Note (Signed)
Addended by: Eulis Foster on: 02/26/2015 08:35 AM   Modules accepted: Orders

## 2015-02-26 NOTE — Progress Notes (Signed)
Cardiology Office Note   Date:  02/26/2015   ID:  Nova, Worman October 21, 1932, MRN [id number]  PCP:  Marton Redwood, MD  Cardiologist:   Mertie Moores, MD  Chief Complaint  Patient presents with  . Hypertension  . Congestive Heart Failure    Kayla Medina is a 81 y.o. female who presents for blood pressure follow up after Dr. Acie Fredrickson added spironolactone last week.  She has a PMH significant for Takotsubo syndrome with an EF of 25-30%.  She has been treated with a beta blocker and ARB but still has some SOB.  Dr. Acie Fredrickson added spironolactone to see if that may improve her EF.  She was started on 12.5mg  daily.  She reports no issues since starting the medication.  She did notice some increase in urination the first few days but that has subsided.  She continues to have mild SOB.  She is a Quarry manager.  She did check her BP once at home and stated it was "ok" but she did not remember the numbers.   BP Readings from Last 3 Encounters:  02/26/15 134/72  02/17/15 150/75  11/05/14 136/53   Pulse Readings from Last 3 Encounters:  02/26/15 74  02/17/15 67  11/05/14 69    Past Medical History  Diagnosis Date  . Chest pain, unspecified   . Other and unspecified hyperlipidemia   . Shortness of breath   . Palpitations   . Shingles     hx  . Diabetes mellitus without complication ([place])   . Hypertension   . Rosacea   . Anal fissure   . Pneumonia   . PVC (premature ventricular contraction)   . Glaucoma     pt. states no-glaucoma 07/20/13  . Subclinical hypothyroidism      Current Outpatient Prescriptions  Medication Sig Dispense Refill  . ALPHA LIPOIC ACID PO Take 100 mg by mouth daily.    Marland Kitchen ALPRAZolam (XANAX) 0.5 MG tablet Take 0.5 mg by mouth at bedtime as needed for anxiety.    Marland Kitchen aspirin EC 81 MG EC tablet Take 1 tablet (81 mg total) by mouth daily. 30 tablet 0  . atorvastatin (LIPITOR) 10 MG tablet Take 1 tablet (10 mg total) by mouth daily. 90 tablet 3  . carvedilol (COREG) 12.5 MG  tablet Take 1 tablet (12.5 mg total) by mouth 2 (two) times daily with a meal. 180 tablet 3  . Coenzyme Q10 (CO Q-10) 30 MG CAPS Take 30 mg by mouth daily.     Marland Kitchen desonide (DESOWEN) 0.05 % cream Apply 1 application topically 2 (two) times daily as needed (for itching/dry skin).    Marland Kitchen guaiFENesin (MUCINEX) 600 MG 12 hr tablet Take 1 tablet (600 mg total) by mouth 2 (two) times daily as needed for cough or to loosen phlegm.    . meclizine (ANTIVERT) 25 MG tablet Take 1 tablet (25 mg total) by mouth 3 (three) times daily as needed for dizziness. 30 tablet 0  . metFORMIN (GLUCOPHAGE) 1000 MG tablet Take 1 tablet (1,000 mg total) by mouth 2 (two) times daily.    . metroNIDAZOLE (METROCREAM) 0.75 % cream Apply 1 application topically 2 (two) times daily.    . montelukast (SINGULAIR) 10 MG tablet Take 10 mg by mouth at bedtime.    Marland Kitchen spironolactone (ALDACTONE) 25 MG tablet Take 0.5 tablets (12.5 mg total) by mouth daily. 45 tablet 3  . valsartan (DIOVAN) 80 MG tablet TAKE ONE TABLET BY MOUTH ONCE DAILY 90 tablet  2  . vitamin B-12 (CYANOCOBALAMIN) 1000 MCG tablet Take 1,000 mcg by mouth daily.    . vitamin C (ASCORBIC ACID) 500 MG tablet Take 500 mg by mouth daily.    . vitamin E 600 UNIT capsule Take 400 Units by mouth daily.      No current facility-administered medications for this visit.    Allergies:   Codeine; Januvia; Other; Penicillins; and Sulfonamide derivatives    ASSESSMENT AND PLAN:  1.  Hypertension/Chronic systolic congestive heart failure: Pt's BP at goal today with addition of spironolactone.  Pt tolerating therapy.  BMET checked today.  Will follow up with results. Pt has an appt with Dr. Acie Fredrickson in January.   Cyndee Brightly [facility]  02/26/2015 9:22 AM    [facility] [place], [place], [place]  [address] Phone: [phone]; Fax: [phone]

## 2015-03-13 ENCOUNTER — Other Ambulatory Visit: Payer: Medicare Other

## 2015-04-08 ENCOUNTER — Ambulatory Visit (INDEPENDENT_AMBULATORY_CARE_PROVIDER_SITE_OTHER): Payer: Medicare Other | Admitting: Cardiovascular Disease

## 2015-04-08 ENCOUNTER — Encounter: Payer: Self-pay | Admitting: Cardiovascular Disease

## 2015-04-08 VITALS — BP 130/64 | HR 67 | Ht 66.0 in | Wt 163.1 lb

## 2015-04-08 DIAGNOSIS — I10 Essential (primary) hypertension: Secondary | ICD-10-CM | POA: Diagnosis not present

## 2015-04-08 DIAGNOSIS — I5023 Acute on chronic systolic (congestive) heart failure: Secondary | ICD-10-CM

## 2015-04-08 DIAGNOSIS — I5022 Chronic systolic (congestive) heart failure: Secondary | ICD-10-CM | POA: Diagnosis not present

## 2015-04-08 DIAGNOSIS — I5181 Takotsubo syndrome: Secondary | ICD-10-CM

## 2015-04-08 MED ORDER — FUROSEMIDE 40 MG PO TABS: 40.0000 mg | ORAL_TABLET | Freq: Every day | ORAL | Status: DC

## 2015-04-08 MED ORDER — POTASSIUM CHLORIDE CRYS ER 20 MEQ PO TBCR: 20.0000 meq | EXTENDED_RELEASE_TABLET | Freq: Every day | ORAL | Status: DC

## 2015-04-08 NOTE — Progress Notes (Signed)
Cardiology Office Note   Date:  04/08/2015   ID:  Kayla Medina, DOB October 31, 1932, MRN [id number]  PCP:  Marton Redwood, MD  Cardiologist:   Acie Fredrickson Wonda Cheng, MD   Chief Complaint  Patient presents with  . Follow-up    chronic systolic CHF   1. Takotsubo Syndrome-   2.actue on chronic systolic CHF - EF 123XX123 -  3. Hypertension-  4. Diabetes mellitus 5. LBBB    Kayla Medina is a 82 y.o. female who presents for f/u of her mild MI - Takotsubo syndrome. Feels weak, no CP, Breathing is ok. Has some DOE .   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     Kayla Medina is a 82 y.o. female who presents for follow up of her Takotsubo.  BP at home is ok.  A bit elevated here. 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   September 10, 2014: Ms. Shallcross EF has fallen ( unexpectedly) .  She was diagnosed with Takotsubo  syndrome and has been on a beta blocker and angiotensin blocker since that time. Her ejection fraction has fallen to 25-30%.  She was recently seen by Dr. Brigitte Pulse and had some hypertension. Amlodipine was added to her medical regimen at that time.  September 25, 2014:  Ms. Ropp is seen back today for follow up of her severe LV dysfunction She was started on valsartan ( instead of Losartan ) during her last visit. We increase her Coreg and DC'd the Amlodipine . Her BP has been better.  Breathing is better.   She had foot surgery 2 days ago.  Is getting along ok    October 30, 2014:   She is doing ok. No CP or dyspnea.   Nov. 28, 2016:  Crystol is seen today for follow up of her Takotsubo syndrome and chronic Systolic CHF. Hx of HTN BP is up a bit today -  Ate some ham over Thanksgiving . She is short of breath at times.   Jan. 17, 2017: Has some dyspnea - particularly with exertion . Walking to the kitchen causes her to have dyspnea.  Is still eating lots of salt .   She craves salt.  Still eating lots of salty snacks.    Past Medical History  Diagnosis  Date  . Chest pain, unspecified   . Other and unspecified hyperlipidemia   . Shortness of breath   . Palpitations   . Shingles     hx  . Diabetes mellitus without complication ([place])   . Hypertension   . Rosacea   . Anal fissure   . Pneumonia   . PVC (premature ventricular contraction)   . Glaucoma     pt. states no-glaucoma 07/20/13  . Subclinical hypothyroidism     Past Surgical History  Procedure Laterality Date  . Bladder repair    . Bowel resection    . Bilateral eye surgery/cataract repair  3/09  . Total abdominal hysterectomy    . Appendectomy    . Left heart catheterization with coronary angiogram N/A 04/24/2014    Procedure: LEFT HEART CATHETERIZATION WITH CORONARY ANGIOGRAM;  Surgeon: Wellington Hampshire, MD;  Location: [facility];  Service: Cardiovascular;  Laterality: N/A;     Current Outpatient Prescriptions  Medication Sig Dispense Refill  . ALPHA LIPOIC ACID PO Take 100 mg by mouth daily.    Marland Kitchen ALPRAZolam (XANAX) 0.5 MG tablet Take 0.5 mg by mouth at bedtime  as needed for anxiety.    Marland Kitchen aspirin EC 81 MG EC tablet Take 1 tablet (81 mg total) by mouth daily. 30 tablet 0  . carvedilol (COREG) 12.5 MG tablet Take 1 tablet (12.5 mg total) by mouth 2 (two) times daily with a meal. 180 tablet 3  . Coenzyme Q10 (CO Q-10) 30 MG CAPS Take 30 mg by mouth daily.     Marland Kitchen desonide (DESOWEN) 0.05 % cream Apply 1 application topically 2 (two) times daily as needed (for itching/dry skin).    Marland Kitchen guaiFENesin (MUCINEX) 600 MG 12 hr tablet Take 1 tablet (600 mg total) by mouth 2 (two) times daily as needed for cough or to loosen phlegm.    . meclizine (ANTIVERT) 25 MG tablet Take 1 tablet (25 mg total) by mouth 3 (three) times daily as needed for dizziness. 30 tablet 0  . metFORMIN (GLUCOPHAGE) 1000 MG tablet Take 1 tablet (1,000 mg total) by mouth 2 (two) times daily.    . metroNIDAZOLE (METROCREAM) 0.75 % cream Apply 1 application topically 2 (two) times daily.    Marland Kitchen spironolactone  (ALDACTONE) 25 MG tablet Take 0.5 tablets (12.5 mg total) by mouth daily. 45 tablet 3  . valsartan (DIOVAN) 80 MG tablet TAKE ONE TABLET BY MOUTH ONCE DAILY 90 tablet 2  . vitamin B-12 (CYANOCOBALAMIN) 1000 MCG tablet Take 1,000 mcg by mouth daily.    . vitamin C (ASCORBIC ACID) 500 MG tablet Take 500 mg by mouth daily.    . vitamin E 600 UNIT capsule Take 400 Units by mouth daily.     Marland Kitchen atorvastatin (LIPITOR) 10 MG tablet Take 1 tablet (10 mg total) by mouth daily. (Patient not taking: Reported on 04/08/2015) 90 tablet 3  . montelukast (SINGULAIR) 10 MG tablet Take 10 mg by mouth at bedtime. Reported on 04/08/2015     No current facility-administered medications for this visit.    Allergies:   Codeine; Januvia; Other; Penicillins; and Sulfonamide derivatives    Social History:  The patient  reports that she has never smoked. She has never used smokeless tobacco. She reports that she does not drink alcohol or use illicit drugs.   Family History:  The patient's family history includes Alcoholism in her father; Alzheimer's disease in her mother; Diabetes in her daughter, maternal grandmother, and paternal grandmother; Hypertension in her mother; Other in her sister; Prostate cancer in her brother; Seizures in her sister; Thyroid disease in her brother, daughter, mother, and son.    ROS:  Please see the history of present illness.    Review of Systems: Constitutional:  denies fever, chills, diaphoresis, appetite change and fatigue.  HEENT: denies photophobia, eye pain, redness, hearing loss, ear pain, congestion, sore throat, rhinorrhea, sneezing, neck pain, neck stiffness and tinnitus.  Respiratory: denies SOB, DOE, cough, chest tightness, and wheezing.  Cardiovascular: denies chest pain, palpitations and leg swelling.  Gastrointestinal: denies nausea, vomiting, abdominal pain, diarrhea, constipation, blood in stool.  Genitourinary: denies dysuria, urgency, frequency, hematuria, flank pain  and difficulty urinating.  Musculoskeletal: denies  myalgias, back pain, joint swelling, arthralgias and gait problem.   Skin: denies pallor, rash and wound.  Neurological: denies dizziness, seizures, syncope, weakness, light-headedness, numbness and headaches.   Hematological: denies adenopathy, easy bruising, personal or family bleeding history.  Psychiatric/ Behavioral: denies suicidal ideation, mood changes, confusion, nervousness, sleep disturbance and agitation.       All other systems are reviewed and negative.    PHYSICAL EXAM: VS:  BP 130/64 mmHg  Pulse 67  Ht 5\' 6"  (1.676 m)  Wt 163 lb 1.9 oz (73.991 kg)  BMI 26.34 kg/m2  SpO2 96% , BMI Body mass index is 26.34 kg/(m^2). GEN: Well nourished, well developed, in no acute distress HEENT: normal Neck: no JVD, carotid bruits, or masses Cardiac: RRR; no murmurs, rubs, or gallops,no edema  Respiratory:  clear to auscultation bilaterally, normal work of breathing GI: soft, nontender, nondistended, + BS MS: no deformity or atrophy Skin: warm and dry, no rash Neuro:  Strength and sensation are intact Psych: normal   EKG:  EKG is ordered today. NSR at 67, LBBB     Recent Labs: 04/23/2014: TSH 5.257* 04/25/2014: ALT 12; Hemoglobin 11.4*; Platelets 334 02/26/2015: BUN 16; Creat 0.97*; Potassium 4.1; Sodium 139    Lipid Panel    Component Value Date/Time   CHOL 231* 04/23/2014 0045   TRIG 297* 04/23/2014 0045   HDL 45 04/23/2014 0045   CHOLHDL 5.1 04/23/2014 0045   VLDL 59* 04/23/2014 0045   LDLCALC 127* 04/23/2014 0045      Wt Readings from Last 3 Encounters:  04/08/15 163 lb 1.9 oz (73.991 kg)  02/17/15 164 lb (74.39 kg)  10/30/14 164 lb 12.8 oz (74.753 kg)      Other studies Reviewed: Additional studies/ records that were reviewed today include: . Review of the above records demonstrates:    ASSESSMENT AND PLAN:  1.  Takotsubo syndrome:    2.  Chronic systolic congestive heart failure: The patient  presented to the hospital   with Takotsubo  syndrome.   Her left ventricle systolic function has decreased to EF of 25-30%. In talking with her , it is clear that she still eats lots of salty snacks .   We had a long discussion about this .    I suspect that this is the reason that her EF has remained low.   Will add Lasix 40 a day , Kdur 20 a day   3. Essential hypertension:  Blood pressure is better.      Current medicines are reviewed at length with the patient today.  The patient does not have concerns regarding medicines.   Labs/ tests ordered today include:  No orders of the defined types were placed in this encounter.    Disposition:   FU with me in 3 months    Signed, Nahser, Wonda Cheng, MD  04/08/2015 10:46 AM    [facility] [place], [place], [place]  [address] Phone: [phone]; Fax: [phone]

## 2015-04-08 NOTE — Patient Instructions (Signed)
Medication Instructions:  START Lasix (furosemide) 40 mg once daily - take in the morning START Kdur (potassium supplement) 20 meq once daily - take in the morning   Labwork: Your physician recommends that you return for lab work in: 3 weeks for basic metabolic panel   Testing/Procedures: None Ordered   Follow-Up: Your physician wants you to follow-up in: 3 months with Dr. Acie Fredrickson.  You will receive a reminder letter in the mail two months in advance. If you don't receive a letter, please call our office to schedule the follow-up appointment.   If you need a refill on your cardiac medications before your next appointment, please call your pharmacy.   Thank you for choosing [facility]! Christen Bame, RN [phone]

## 2015-04-11 ENCOUNTER — Telehealth: Payer: Self-pay

## 2015-04-11 NOTE — Telephone Encounter (Signed)
The pt states that she is having a bad reaction to Lasix and/or Potassium that she started taking on 1/17 per Dr Acie Fredrickson at her last OV on 1/17. She c/o a bad headache, cramping, cant eat, diarrhea, and weakness. She wants to stop taking both of these medications and is asking if she can take something else instead.  She is advised that I will discuss with Dr Acie Fredrickson and call her back with his recommendations. She verbalized understanding.  Please advise.

## 2015-04-11 NOTE — Telephone Encounter (Signed)
The pt is advised and she verbalized understanding. 

## 2015-04-11 NOTE — Telephone Encounter (Signed)
Its possible that she is having a reaction to the lasix and Kdur .   Her symptoms sound more like a viral illness to  Me.   She may hold these meds for 3 days and see if the symptoms resolve

## 2015-04-17 ENCOUNTER — Telehealth: Payer: Self-pay | Admitting: Cardiovascular Disease

## 2015-04-17 NOTE — Telephone Encounter (Signed)
New Message:  Pt called in stating that Dr. Acie Fredrickson wanted her to have a pacemaker placed and she stated that she wants an appt to have this procedure done. Please f/u with her  Thanks

## 2015-04-17 NOTE — Telephone Encounter (Signed)
Pt is calling into the office to inform Dr Acie Fredrickson and nurse that she is ready to proceed with the process in getting a pacemaker placement.  Pt states that Dr Acie Fredrickson talked about this with her at her 02/17/15 OV.  Pt states she saw Dr Acie Fredrickson on 04/08/15 as a follow-up OV.  Pt states that they discussed about her possibly needing a pacemaker.   Informed the pt that I see this was discussed at the pts 11/28 OV with Dr Acie Fredrickson, but did not note this discussion in the 1/17 OV note.  Below is the assessment and plan from 02/17/15 OV with the pt.  ASSESSMENT AND PLAN:  1. Takotsubo syndrome:   2. Chronic systolic congestive heart failure: The patient presented to the hospital with Takotsubo syndrome. Her left ventricle systolic function has decreased to EF of 25-30%.   We have added Aldactone 12.5 mg a day. We will check a basic medical profile and have her return for a nurse visit, blood pressure and heart rate check in one week. I'll see her again in 6 weeks for follow-up visit. Once we maximized her medications we'll check an echocardiogram. If her ejection fraction is still less than 35% then we will refer her to EP for consideration for a biventricular pacer.   Informed the pt that I will most definitely send this message to Dr Acie Fredrickson and his nurse for further review and recommendation on her request, and follow-up with the pt thereafter.  Informed the pt that if Dr Acie Fredrickson still wants to proceed with the pt getting a BI-V pacer, then she will more than likely need to be set up for an appt with one of our EP Doctors for consideration of this and setting this procedure up appropriately.  Pt verbalized understanding and agrees with this plan.  Pt very gracious for all the assistance provided.

## 2015-04-17 NOTE — Telephone Encounter (Deleted)
      ASSESSMENT AND PLAN:  02/17/15 Dr. Acie Fredrickson  1. Takotsubo syndrome:   2. Chronic systolic congestive heart failure: The patient presented to the hospital with Takotsubo syndrome. Her left ventricle systolic function has decreased to EF of 25-30%.   We have added Aldactone 12.5 mg a day. We will check a basic medical profile and have her return for a nurse visit, blood pressure and heart rate check in one week. I'll see her again in 6 weeks for follow-up visit. Once we maximized her medications we'll check an echocardiogram. If her ejection fraction is still less than 35% then we will refer her to EP for consideration for a biventricular pacer.

## 2015-04-17 NOTE — Telephone Encounter (Signed)
Spoke with pt and informed her of Dr. Elmarie Shiley response. Pt states that Lasix and KDUR were stopped on 1/20 and she felt a little better. Pt states that she did not restart the medicine until today and only took a half dose of each because she was feeling bad and had some slight SOB. Asked pt if she felt like she had any reaction to the medication after taking it. Pt states that it will be tomorrow before she knows if she is having any kind of reaction. Advised pt to call our office tomorrow and let us know if she had a reaction or not so that we know how to proceed with her Lasix and K+. Pt verbalized understanding and was in agreement with this plan.

## 2015-04-17 NOTE — Telephone Encounter (Signed)
We need to see how she does on maximal medical therapy and then we will repeat the echo to make sure she still needs the pacer.  She needs to continue to avoid salt.  Is she able to tolerate the lasix and potassium now ?

## 2015-04-18 MED ORDER — POTASSIUM CHLORIDE CRYS ER 20 MEQ PO TBCR: 10.0000 meq | EXTENDED_RELEASE_TABLET | Freq: Every day | ORAL | Status: DC

## 2015-04-18 MED ORDER — FUROSEMIDE 40 MG PO TABS: 20.0000 mg | ORAL_TABLET | Freq: Every day | ORAL | Status: DC

## 2015-04-18 NOTE — Telephone Encounter (Signed)
Spoke with patient to make certain she understands the plan of care.  I advised her that Dr. Acie Fredrickson is continuing to treat her with medications for her heart failure and that he will reevaluate in a few months.  She says she thought the plan had changed.  I advised her that there is no change, just time needed to see how she responds to medical therapy before referring to EP.  She has a follow-up appointment in April with Dr. Acie Fredrickson.  She states she is tolerating Lasix 20 mg and kdur 10 meq presently.  I advised her to call back with questions or concerns prior to that time.  She verbalized understanding and agreement.

## 2015-04-18 NOTE — Telephone Encounter (Signed)
Follow up    Patient calling stating 1/2 medication will work for her.

## 2015-04-21 ENCOUNTER — Telehealth: Payer: Self-pay | Admitting: Cardiovascular Disease

## 2015-04-21 NOTE — Telephone Encounter (Signed)
New Message  Pt stated that even the half of lasix still makes her sick. Please call back and discuss.

## 2015-04-21 NOTE — Telephone Encounter (Signed)
Follow Up  Pt returned call. Please call back to discuss.

## 2015-04-21 NOTE — Telephone Encounter (Signed)
Left message for patient to call me back regarding details of how the lasix is making her feel sick.

## 2015-04-21 NOTE — Telephone Encounter (Signed)
Spoke with patient who states she is taking Lasix 20 mg and kdur 10 meq and she continues to have nausea; denies vomiting or diarrhea; states "I just feel sick."  I asked her if she is drinking plenty of water and she states she keeps a cup of water with her all day.  I asked her to take the lasix only tomorrow and to take only the kdur on Wednesday to see if she can identify which one is making her nauseous.  Dr. Acie Fredrickson has requested this information to help him determine therapy.  I asked her to call back Wednesday to report.  She verbalized understanding and agreement.

## 2015-04-22 NOTE — Telephone Encounter (Signed)
Continue low dosw lasix and Kdur. She seems to be tolerating this fairly well.

## 2015-04-24 ENCOUNTER — Telehealth: Payer: Self-pay | Admitting: Cardiovascular Disease

## 2015-04-24 NOTE — Telephone Encounter (Signed)
That seems like it would work

## 2015-04-24 NOTE — Telephone Encounter (Signed)
New message ° ° °Patient returning call back to nurse.  °

## 2015-04-24 NOTE — Telephone Encounter (Signed)
Note: Se previous message.

## 2015-04-24 NOTE — Telephone Encounter (Signed)
Pt is aware that is okay for Dr. Acie Fredrickson for her  to take   lasix and potassium in different days. Pt verbalized understanding.

## 2015-04-24 NOTE — Telephone Encounter (Signed)
Patient st she took her Lasix only and her K+ only as directed by Sharyn Lull, Therapist, sports. She reports that when taken on different days, she feels fine. She only becomes symptomatic when taken on the same day. She said she even tried taking one in the AM and one in the PM and that did not work.  She requests to alternate days she takes K and Lasix.  To Dr. Acie Fredrickson.

## 2015-04-24 NOTE — Telephone Encounter (Signed)
Left pt a message to call back. 

## 2015-04-24 NOTE — Telephone Encounter (Signed)
Follow up      Pt states she cannot take lasix and  klor-con together.  It makes her sick.  She can take one tablet one day and another the other day.  Will this be ok?

## 2015-04-24 NOTE — Telephone Encounter (Signed)
Left message to call back  

## 2015-04-29 ENCOUNTER — Other Ambulatory Visit (INDEPENDENT_AMBULATORY_CARE_PROVIDER_SITE_OTHER): Payer: Medicare Other | Admitting: *Deleted

## 2015-04-29 DIAGNOSIS — I5022 Chronic systolic (congestive) heart failure: Secondary | ICD-10-CM

## 2015-04-29 LAB — BASIC METABOLIC PANEL
BUN: 23 mg/dL (ref 7–25)
CALCIUM: 8.9 mg/dL (ref 8.6–10.4)
CO2: 23 mmol/L (ref 20–31)
CREATININE: 1.06 mg/dL — AB (ref 0.60–0.88)
Chloride: 104 mmol/L (ref 98–110)
GLUCOSE: 265 mg/dL — AB (ref 65–99)
Potassium: 4 mmol/L (ref 3.5–5.3)
Sodium: 137 mmol/L (ref 135–146)

## 2015-05-27 ENCOUNTER — Telehealth: Payer: Self-pay | Admitting: Physician Assistant

## 2015-05-27 NOTE — Telephone Encounter (Signed)
    Patient paged on call provider. SHe has been having soft BPs and can't stand up due to feeling like she is going to black out x1 week. BP this AM 109/64. I have advised her to decrease he Valsartan from 80mg  --> 40mg  daily and continue watching her BP. If she does not feel better or her BP goes high, she will call the office to be seen.    Angelena Form PA-C  [facility]

## 2015-06-02 ENCOUNTER — Emergency Department (HOSPITAL_COMMUNITY)
Admission: EM | Admit: 2015-06-02 | Discharge: 2015-06-03 | Disposition: A | Discharge status: Home / Self Care | Payer: Medicare Other | Attending: Emergency Medicine | Admitting: Emergency Medicine

## 2015-06-02 ENCOUNTER — Emergency Department (HOSPITAL_COMMUNITY): Payer: Medicare Other

## 2015-06-02 ENCOUNTER — Telehealth: Payer: Self-pay | Admitting: Cardiovascular Disease

## 2015-06-02 ENCOUNTER — Encounter (HOSPITAL_COMMUNITY): Payer: Self-pay | Admitting: Family Medicine

## 2015-06-02 DIAGNOSIS — Z9889 Other specified postprocedural states: Secondary | ICD-10-CM | POA: Diagnosis not present

## 2015-06-02 DIAGNOSIS — Z872 Personal history of diseases of the skin and subcutaneous tissue: Secondary | ICD-10-CM | POA: Insufficient documentation

## 2015-06-02 DIAGNOSIS — R0789 Other chest pain: Secondary | ICD-10-CM

## 2015-06-02 DIAGNOSIS — Z8619 Personal history of other infectious and parasitic diseases: Secondary | ICD-10-CM | POA: Insufficient documentation

## 2015-06-02 DIAGNOSIS — Z8719 Personal history of other diseases of the digestive system: Secondary | ICD-10-CM | POA: Insufficient documentation

## 2015-06-02 DIAGNOSIS — R079 Chest pain, unspecified: Secondary | ICD-10-CM | POA: Diagnosis present

## 2015-06-02 DIAGNOSIS — F329 Major depressive disorder, single episode, unspecified: Secondary | ICD-10-CM | POA: Diagnosis not present

## 2015-06-02 DIAGNOSIS — E785 Hyperlipidemia, unspecified: Secondary | ICD-10-CM | POA: Diagnosis not present

## 2015-06-02 DIAGNOSIS — Z7982 Long term (current) use of aspirin: Secondary | ICD-10-CM | POA: Insufficient documentation

## 2015-06-02 DIAGNOSIS — Z7984 Long term (current) use of oral hypoglycemic drugs: Secondary | ICD-10-CM | POA: Insufficient documentation

## 2015-06-02 DIAGNOSIS — Z79899 Other long term (current) drug therapy: Secondary | ICD-10-CM | POA: Insufficient documentation

## 2015-06-02 DIAGNOSIS — F419 Anxiety disorder, unspecified: Secondary | ICD-10-CM | POA: Diagnosis not present

## 2015-06-02 DIAGNOSIS — Z8669 Personal history of other diseases of the nervous system and sense organs: Secondary | ICD-10-CM | POA: Insufficient documentation

## 2015-06-02 DIAGNOSIS — Z880 Allergy status to penicillin: Secondary | ICD-10-CM | POA: Insufficient documentation

## 2015-06-02 DIAGNOSIS — E119 Type 2 diabetes mellitus without complications: Secondary | ICD-10-CM | POA: Insufficient documentation

## 2015-06-02 DIAGNOSIS — I10 Essential (primary) hypertension: Secondary | ICD-10-CM | POA: Diagnosis not present

## 2015-06-02 LAB — CBC
HCT: 37.7 % (ref 36.0–46.0)
Hemoglobin: 12.4 g/dL (ref 12.0–15.0)
MCH: 29.7 pg (ref 26.0–34.0)
MCHC: 32.9 g/dL (ref 30.0–36.0)
MCV: 90.4 fL (ref 78.0–100.0)
Platelets: 306 10*3/uL (ref 150–400)
RBC: 4.17 MIL/uL (ref 3.87–5.11)
RDW: 13.4 % (ref 11.5–15.5)
WBC: 8.4 10*3/uL (ref 4.0–10.5)

## 2015-06-02 LAB — I-STAT TROPONIN, ED: Troponin i, poc: 0 ng/mL (ref 0.00–0.08)

## 2015-06-02 LAB — BASIC METABOLIC PANEL
Anion gap: 5 (ref 5–15)
BUN: 19 mg/dL (ref 6–20)
CHLORIDE: 113 mmol/L — AB (ref 101–111)
CO2: 24 mmol/L (ref 22–32)
Calcium: 10.2 mg/dL (ref 8.9–10.3)
Creatinine, Ser: 1.04 mg/dL — ABNORMAL HIGH (ref 0.44–1.00)
GFR calc Af Amer: 57 mL/min — ABNORMAL LOW (ref >60)
GFR calc non Af Amer: 49 mL/min — ABNORMAL LOW (ref >60)
Glucose, Bld: 142 mg/dL — ABNORMAL HIGH (ref 65–99)
Potassium: 4.2 mmol/L (ref 3.5–5.1)
SODIUM: 142 mmol/L (ref 135–145)

## 2015-06-02 LAB — TROPONIN I

## 2015-06-02 LAB — BRAIN NATRIURETIC PEPTIDE: B NATRIURETIC PEPTIDE 5: 139.3 pg/mL — AB (ref 0.0–100.0)

## 2015-06-02 LAB — CBG MONITORING, ED: Glucose-Capillary: 127 mg/dL — ABNORMAL HIGH (ref 65–99)

## 2015-06-02 MED ORDER — SODIUM CHLORIDE 0.9 % IV SOLN
INTRAVENOUS | Status: DC
  Administered 2015-06-02: 1000 mL via INTRAVENOUS

## 2015-06-02 MED ORDER — ONDANSETRON HCL 4 MG/2ML IJ SOLN
4.0000 mg | Freq: Once | INTRAMUSCULAR | Status: AC
  Administered 2015-06-02: 4 mg via INTRAVENOUS
  Filled 2015-06-02: qty 2

## 2015-06-02 MED ORDER — FENTANYL CITRATE (PF) 100 MCG/2ML IJ SOLN: 75.0000 ug | Freq: Once | INTRAMUSCULAR | Status: DC

## 2015-06-02 NOTE — ED Notes (Signed)
MD at bedside. 

## 2015-06-02 NOTE — Telephone Encounter (Signed)
Received call directly from operator from patient who c/o chest pain onset last night at approximately 8:00 pm.  She describes chest pain as heavy pressure and SOB.  She denies diaphoresis or nausea.  States she was sitting watching television when symptoms started; she does not have NTG.  Last cath was 2/16.  She was last seen by Dr. Acie Fredrickson 04/08/15 and complained of DOE at that time.  She states those symptoms resolved and she has been feeling better since getting her medications adjusted after that visit.  She states the onset of this pain was sudden and has been pretty continuous although she has had periods of time where she has been pain free.  Blood sugar this morning was 170.  She called the on-call provider on 3/7 with complaints of low BP.  Valsartan dose was decreased from 80 mg to 40 mg by K. Grandville Silos, [place] and patient states she has tolerated well since and BP has been within normal limits (123456 systolic, [id number] diastolic).  I advised her that Dr. Acie Fredrickson is not in the office this week and there is no appointment available with our FLEX APP today or tomorrow.  I advised that she should go to the ER for evaluation.  She verbalized understanding and agreement with plan and states her son will drive her to [facility] ER.

## 2015-06-02 NOTE — ED Notes (Signed)
PA at bedside.

## 2015-06-02 NOTE — Telephone Encounter (Signed)
New messahe   Pt c/o of Chest Pain: STAT if CP now or developed within 24 hours  1. Are you having CP right now? NO,  2. Are you experiencing any other symptoms (ex. SOB, nausea, vomiting, sweating)? NO 3. How long have you been experiencing CP? 3-12 4. Is your CP continuous or coming and going? COMING AND GOING 5. Have you taken Nitroglycerin? NO ?

## 2015-06-02 NOTE — ED Notes (Signed)
Pt here for chest discomfort since last night. sts the pain has been constant.

## 2015-06-02 NOTE — ED Provider Notes (Signed)
Pt seen and evaluated.  Lt CP (Point specific , and reproducible to palpate) for 24 hours.  Unchanged EKG, and Neg serial enzymes. No changes on CXR. No shingles.  Declines any medications.  Appropriate for DC.  Tanna Furry, MD 06/02/15 [phone]

## 2015-06-02 NOTE — ED Provider Notes (Signed)
CSN: [id number]     Arrival date & time 06/02/15  1447 History   First MD Initiated Contact with Patient 06/02/15 2017     Chief Complaint  Patient presents with  . Chest Pain     (Consider location/radiation/quality/duration/timing/severity/associated sxs/prior Treatment) HPI   Patient has a PMH of CP, SOB, shingles, palpitations, diabetes, hypertension, rosacea, anal fissure, pneumonia, PVC, subclinical hypothyroidism - history of cardiac cath in 2016 which was not significantly abnormal. comes to the ER with complaints of chest pressure that started 8 pm and felt like a substernal pressure that radiates up into the left side of her neck. It has waxed and waned but has not subsided. She takes aspirin at home but does not have nitroglycerin, therefore she is not taken any nitroglycerin. As not tried anything else to relieve the pain. She states that last night she can fall asleep until around 2 AM where she took a Xanax to help her fall asleep because she was very anxious about the pain. She called her cardiologist office today to try to be seen but they were unable to fit her in and recommended she come to the ER. She typically sees Dr. Cathie Olden. She has not had any N/V/D, diaphoresis. She has baseline DOE which she says is unchanged. No fever, cough, LE swelling, back pain, headache, confusion or lethargy. Pt is tearful and seems depressed by she denies.  Past Medical History  Diagnosis Date  . Chest pain, unspecified   . Other and unspecified hyperlipidemia   . Shortness of breath   . Palpitations   . Shingles     hx  . Diabetes mellitus without complication ([place])   . Hypertension   . Rosacea   . Anal fissure   . Pneumonia   . PVC (premature ventricular contraction)   . Glaucoma     pt. states no-glaucoma 07/20/13  . Subclinical hypothyroidism    Past Surgical History  Procedure Laterality Date  . Bladder repair    . Bowel resection    . Bilateral eye surgery/cataract repair  3/09   . Total abdominal hysterectomy    . Appendectomy    . Left heart catheterization with coronary angiogram N/A 04/24/2014    Procedure: LEFT HEART CATHETERIZATION WITH CORONARY ANGIOGRAM;  Surgeon: Wellington Hampshire, MD;  Location: [facility];  Service: Cardiovascular;  Laterality: N/A;   Family History  Problem Relation Age of Onset  . Alzheimer's disease Mother   . Hypertension Mother   . Thyroid disease Mother   . Alcoholism Father   . Prostate cancer Brother   . Seizures Sister   . Diabetes Maternal Grandmother   . Diabetes Daughter     x 2  . Diabetes Paternal Grandmother     entire family  . Thyroid disease Brother     x2   . Thyroid disease Daughter   . Thyroid disease Son   . Other Sister     meningitis   Social History  Substance Use Topics  . Smoking status: Never Smoker   . Smokeless tobacco: Never Used  . Alcohol Use: No   OB History    No data available     Review of Systems  Review of Systems All other systems negative except as documented in the HPI. All pertinent positives and negatives as reviewed in the HPI.   Allergies  Codeine; Januvia; Other; Penicillins; and Sulfonamide derivatives  Home Medications   Prior to Admission medications   Medication Sig Start  Date End Date Taking? Authorizing Provider  ALPRAZolam Duanne Moron) 0.5 MG tablet Take 0.5 mg by mouth at bedtime as needed for anxiety.   Yes Historical Provider, MD  aspirin EC 81 MG EC tablet Take 1 tablet (81 mg total) by mouth daily. 04/25/14  Yes Albertine Patricia, MD  atorvastatin (LIPITOR) 10 MG tablet Take 1 tablet (10 mg total) by mouth daily. 10/30/14  Yes Thayer Headings, MD  carvedilol (COREG) 12.5 MG tablet Take 1 tablet (12.5 mg total) by mouth 2 (two) times daily with a meal. 10/30/14  Yes Thayer Headings, MD  Coenzyme Q10 (CO Q-10) 30 MG CAPS Take 30 mg by mouth daily.    Yes Historical Provider, MD  desonide (DESOWEN) 0.05 % cream Apply 1 application topically 2 (two) times daily as  needed (for itching/dry skin).   Yes Historical Provider, MD  furosemide (LASIX) 40 MG tablet Take 0.5 tablets (20 mg total) by mouth daily. 04/18/15  Yes Thayer Headings, MD  metFORMIN (GLUCOPHAGE) 1000 MG tablet Take 1 tablet (1,000 mg total) by mouth 2 (two) times daily. 04/26/14  Yes Albertine Patricia, MD  Multiple Vitamin (MULTIVITAMIN WITH MINERALS) TABS tablet Take 1 tablet by mouth daily.   Yes Historical Provider, MD  potassium chloride SA (K-DUR,KLOR-CON) 20 MEQ tablet Take 0.5 tablets (10 mEq total) by mouth daily. 04/18/15  Yes Thayer Headings, MD  spironolactone (ALDACTONE) 25 MG tablet Take 0.5 tablets (12.5 mg total) by mouth daily. 02/17/15  Yes Thayer Headings, MD  VITAMIN A PO Take 1 tablet by mouth daily.   Yes Historical Provider, MD  guaiFENesin (MUCINEX) 600 MG 12 hr tablet Take 1 tablet (600 mg total) by mouth 2 (two) times daily as needed for cough or to loosen phlegm. Patient not taking: Reported on 06/02/2015 06/03/13   Shon Baton, MD  meclizine (ANTIVERT) 25 MG tablet Take 1 tablet (25 mg total) by mouth 3 (three) times daily as needed for dizziness. Patient not taking: Reported on 06/02/2015 06/03/13   Shon Baton, MD  valsartan (DIOVAN) 80 MG tablet TAKE ONE TABLET BY MOUTH ONCE DAILY Patient not taking: Reported on 06/02/2015 01/23/15   Wonda Cheng Nahser, MD   BP 144/64 mmHg  Pulse 57  Temp(Src) 97.9 F (36.6 C) (Oral)  Resp 16  SpO2 96% Physical Exam  Constitutional: She appears well-developed and well-nourished. No distress.  HENT:  Head: Normocephalic and atraumatic.  Right Ear: Tympanic membrane and ear canal normal.  Left Ear: Tympanic membrane and ear canal normal.  Nose: Nose normal.  Mouth/Throat: Uvula is midline, oropharynx is clear and moist and mucous membranes are normal.  Eyes: Pupils are equal, round, and reactive to light.  Neck: Normal range of motion. Neck supple.  Cardiovascular: Normal rate and regular rhythm.   Pulmonary/Chest: Effort normal and  breath sounds normal. She exhibits tenderness (discomfort reproducible to palpation of chest wall.). She exhibits no bony tenderness, no laceration, no crepitus and no retraction.  Abdominal: Soft.  No signs of abdominal distention  Musculoskeletal:  No LE swelling  Neurological: She is alert.  Acting at baseline  Skin: Skin is warm and dry. No rash noted.  Psychiatric: Her mood appears anxious. She exhibits a depressed mood.  Nursing note and vitals reviewed.   ED Course  Procedures (including critical care time) Labs Review Labs Reviewed  BASIC METABOLIC PANEL - Abnormal; Notable for the following:    Chloride 113 (*)    Glucose, Bld 142 (*)  Creatinine, Ser 1.04 (*)    GFR calc non Af Amer 49 (*)    GFR calc Af Amer 57 (*)    All other components within normal limits  BRAIN NATRIURETIC PEPTIDE - Abnormal; Notable for the following:    B Natriuretic Peptide 139.3 (*)    All other components within normal limits  CBG MONITORING, ED - Abnormal; Notable for the following:    Glucose-Capillary 127 (*)    All other components within normal limits  CBC  TROPONIN I  Randolm Idol, ED    Imaging Review Dg Chest 2 View  06/02/2015  CLINICAL DATA:  Left-sided chest pain. EXAM: CHEST  2 VIEW COMPARISON:  04/22/2014 FINDINGS: The heart size and mediastinal contours are within normal limits. Both lungs are clear. The visualized skeletal structures are unremarkable. Calcification in the thoracic aorta. IMPRESSION: No active cardiopulmonary disease.  Aortic atherosclerosis. Electronically Signed   By: Lorriane Shire M.D.   On: 06/02/2015 15:24   I have personally reviewed and evaluated these images and lab results as part of my medical decision-making.   EKG Interpretation Date/Time:  Monday June 02 2015 14:52:58 EDT Ventricular Rate:  69 PR Interval:  172 QRS Duration: 134 QT Interval:  444 QTC Calculation: 475 R Axis:   60 Text Interpretation:  Normal sinus rhythm Left  bundle branch block  Abnormal ECG Confirmed by Jeneen Rinks  MD, [place] ([id number]) on 06/02/2015 11:15:29 PM       MDM   Final diagnoses:  Other chest pain    The patient's EKG shows left bundle branch block but is unchanged from previous. She is also had 2 negative troponins and an essentially unremarkable chest x-ray. Discussed this case with Dr. Tanna Furry who recommends giving her some fluids and some nausea medicine and then trying some fentanyl. Patient states that she is allergic to all narcotics with nausea vomiting we will try to control her chest pain and get her feeling better. If not she will require a cardiology consult.   11: 44 pm- Dr. Tanna Furry has seen and spoken with the patient and evaluated her. Her pain is pin point reproducible. He gave her reassurance and feels that she is safe for dc with close follow-up with her PCP. Will discharge home.  Delos Haring, PA-C 06/02/15 [id number]  Tanna Furry, MD 06/10/15 [phone]

## 2015-06-02 NOTE — Discharge Instructions (Signed)

## 2015-06-03 NOTE — ED Notes (Signed)
Pt verbalizes that she is ready to go home, thinks she is being d/c'd asking about the process, pt updated. Alert, NAD, calm, interactive, no dyspnea noted, skin W&D, pt has removed self from all monitoring.

## 2015-06-03 NOTE — ED Notes (Signed)
No changes, alert, NAD, calm, in a hurry to leave, believes her sx were d/t anxiety, daughter here for ride home, out in w/c, denies questions or needs, VSS.

## 2015-07-07 ENCOUNTER — Ambulatory Visit (INDEPENDENT_AMBULATORY_CARE_PROVIDER_SITE_OTHER): Payer: Medicare Other | Admitting: Cardiovascular Disease

## 2015-07-07 ENCOUNTER — Encounter: Payer: Self-pay | Admitting: Cardiovascular Disease

## 2015-07-07 VITALS — BP 122/68 | HR 66 | Ht 66.0 in | Wt 159.0 lb

## 2015-07-07 DIAGNOSIS — I10 Essential (primary) hypertension: Secondary | ICD-10-CM

## 2015-07-07 DIAGNOSIS — I5023 Acute on chronic systolic (congestive) heart failure: Secondary | ICD-10-CM

## 2015-07-07 NOTE — Progress Notes (Signed)
Cardiology Office Note   Date:  07/07/2015   ID:  Kayla Medina, DOB 1932-08-14, MRN [id number]  PCP:  Marton Redwood, MD  Cardiologist:   Acie Fredrickson Wonda Cheng, MD   Chief Complaint  Patient presents with  . Follow-up    CHF   1. Takotsubo Syndrome-   2.actue on chronic systolic CHF - EF [id number] -  3. Hypertension-  4. Diabetes mellitus 5. LBBB    Kayla Medina is a 82 y.o. female who presents for f/u of her mild MI - Takotsubo syndrome. Feels weak, no CP, Breathing is ok. Has some DOE .   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     Kayla Medina is a 82 y.o. female who presents for follow up of her Takotsubo.  BP at home is ok.  A bit elevated here. 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   September 10, 2014: Kayla Medina EF has fallen ( unexpectedly) .  She was diagnosed with Takotsubo  syndrome and has been on a beta blocker and angiotensin blocker since that time. Her ejection fraction has fallen to 25-30%.  She was recently seen by Dr. Brigitte Pulse and had some hypertension. Amlodipine was added to her medical regimen at that time.  September 25, 2014:  Kayla Medina is seen back today for follow up of her severe LV dysfunction She was started on valsartan ( instead of Losartan ) during her last visit. We increase her Coreg and DC'd the Amlodipine . Her BP has been better.  Breathing is better.   She had foot surgery 2 days ago.  Is getting along ok    October 30, 2014:   She is doing ok. No CP or dyspnea.   Nov. 28, 2016:  Kayla Medina is seen today for follow up of her Takotsubo syndrome and chronic Systolic CHF. Hx of HTN BP is up a bit today -  Ate some ham over Thanksgiving . She is short of breath at times.   Jan. 17, 2017: Has some dyspnea - particularly with exertion . Walking to the kitchen causes her to have dyspnea.  Is still eating lots of salt .   She craves salt.  Still eating lots of salty snacks.   July 07, 2015: Doing well Staying fairly  active Has some dyspnea.     Past Medical History  Diagnosis Date  . Chest pain, unspecified   . Other and unspecified hyperlipidemia   . Shortness of breath   . Palpitations   . Shingles     hx  . Diabetes mellitus without complication ([place])   . Hypertension   . Rosacea   . Anal fissure   . Pneumonia   . PVC (premature ventricular contraction)   . Glaucoma     pt. states no-glaucoma 07/20/13  . Subclinical hypothyroidism     Past Surgical History  Procedure Laterality Date  . Bladder repair    . Bowel resection    . Bilateral eye surgery/cataract repair  3/09  . Total abdominal hysterectomy    . Appendectomy    . Left heart catheterization with coronary angiogram N/A 04/24/2014    Procedure: LEFT HEART CATHETERIZATION WITH CORONARY ANGIOGRAM;  Surgeon: Wellington Hampshire, MD;  Location: [place] CATH [facility];  Service: Cardiovascular;  Laterality: N/A;     Current Outpatient Prescriptions  Medication Sig Dispense Refill  . ALPRAZolam (XANAX) 0.5 MG tablet Take 0.5 mg by mouth at bedtime as needed  for anxiety.    Marland Kitchen aspirin EC 81 MG EC tablet Take 1 tablet (81 mg total) by mouth daily. 30 tablet 0  . atorvastatin (LIPITOR) 10 MG tablet Take 1 tablet (10 mg total) by mouth daily. 90 tablet 3  . carvedilol (COREG) 12.5 MG tablet Take 1 tablet (12.5 mg total) by mouth 2 (two) times daily with a meal. 180 tablet 3  . Coenzyme Q10 (CO Q-10) 30 MG CAPS Take 30 mg by mouth daily.     Marland Kitchen desonide (DESOWEN) 0.05 % cream Apply 1 application topically 2 (two) times daily as needed (for itching/dry skin).    . furosemide (LASIX) 40 MG tablet Take 0.5 tablets (20 mg total) by mouth daily. 30 tablet 11  . meclizine (ANTIVERT) 25 MG tablet Take 1 tablet (25 mg total) by mouth 3 (three) times daily as needed for dizziness. 30 tablet 0  . metFORMIN (GLUCOPHAGE) 1000 MG tablet Take 1 tablet (1,000 mg total) by mouth 2 (two) times daily.    . Multiple Vitamin (MULTIVITAMIN WITH MINERALS) TABS tablet Take  1 tablet by mouth daily.    Marland Kitchen spironolactone (ALDACTONE) 25 MG tablet Take 0.5 tablets (12.5 mg total) by mouth daily. 45 tablet 3  . valsartan (DIOVAN) 80 MG tablet TAKE ONE TABLET BY MOUTH ONCE DAILY 90 tablet 2  . VITAMIN A PO Take 1 tablet by mouth daily.     No current facility-administered medications for this visit.    Allergies:   Codeine; Januvia; Other; Penicillins; and Sulfonamide derivatives    Social History:  The patient  reports that she has never smoked. She has never used smokeless tobacco. She reports that she does not drink alcohol or use illicit drugs.   Family History:  The patient's family history includes Alcoholism in her father; Alzheimer's disease in her mother; Diabetes in her daughter, maternal grandmother, and paternal grandmother; Hypertension in her mother; Other in her sister; Prostate cancer in her brother; Seizures in her sister; Thyroid disease in her brother, daughter, mother, and son.    ROS:  Please see the history of present illness.    Review of Systems: Constitutional:  denies fever, chills, diaphoresis, appetite change and fatigue.  HEENT: denies photophobia, eye pain, redness, hearing loss, ear pain, congestion, sore throat, rhinorrhea, sneezing, neck pain, neck stiffness and tinnitus.  Respiratory: denies SOB, DOE, cough, chest tightness, and wheezing.  Cardiovascular: denies chest pain, palpitations and leg swelling.  Gastrointestinal: denies nausea, vomiting, abdominal pain, diarrhea, constipation, blood in stool.  Genitourinary: denies dysuria, urgency, frequency, hematuria, flank pain and difficulty urinating.  Musculoskeletal: denies  myalgias, back pain, joint swelling, arthralgias and gait problem.   Skin: denies pallor, rash and wound.  Neurological: denies dizziness, seizures, syncope, weakness, light-headedness, numbness and headaches.   Hematological: denies adenopathy, easy bruising, personal or family bleeding history.   Psychiatric/ Behavioral: denies suicidal ideation, mood changes, confusion, nervousness, sleep disturbance and agitation.      All other systems are reviewed and negative.   PHYSICAL EXAM: VS:  BP 122/68 mmHg  Pulse 66  Ht 5\' 6"  (1.676 m)  Wt 159 lb (72.122 kg)  BMI 25.68 kg/m2 , BMI Body mass index is 25.68 kg/(m^2). GEN: Well nourished, well developed, in no acute distress HEENT: normal Neck: no JVD, carotid bruits, or masses Cardiac: RRR; no murmurs, rubs, or gallops,no edema  Respiratory:  clear to auscultation bilaterally, normal work of breathing GI: soft, nontender, nondistended, + BS MS: no deformity or atrophy Skin: warm  and dry, no rash Neuro:  Strength and sensation are intact Psych: normal  EKG:  EKG is not ordered today.  Recent Labs: 06/02/2015: B Natriuretic Peptide 139.3*; BUN 19; Creatinine, Ser 1.04*; Hemoglobin 12.4; Platelets 306; Potassium 4.2; Sodium 142    Lipid Panel    Component Value Date/Time   CHOL 231* 04/23/2014 0045   TRIG 297* 04/23/2014 0045   HDL 45 04/23/2014 0045   CHOLHDL 5.1 04/23/2014 0045   VLDL 59* 04/23/2014 0045   LDLCALC 127* 04/23/2014 0045      Wt Readings from Last 3 Encounters:  07/07/15 159 lb (72.122 kg)  04/08/15 163 lb 1.9 oz (73.991 kg)  02/17/15 164 lb (74.39 kg)      Other studies Reviewed: Additional studies/ records that were reviewed today include: . Review of the above records demonstrates:    ASSESSMENT AND PLAN:  1.  Takotsubo syndrome:    2.  Chronic systolic congestive heart failure: The patient presented to the hospital  with Takotsubo  syndrome.   Her left ventricle systolic function has decreased to EF of 25-30%. We discussed Entresto.   She does not want to try it yet.  Will readdress in 3 months .   3. Essential hypertension:  Blood pressure is better.    Current medicines are reviewed at length with the patient today.  The patient does not have concerns regarding medicines.   Labs/  tests ordered today include:  No orders of the defined types were placed in this encounter.    Disposition:   FU with me in 3 months    Signed, Vania Rosero, Wonda Cheng, MD  07/07/2015 8:07 AM    [facility] [address], [place], [place]  [address] Phone: [phone]; Fax: [phone]

## 2015-07-07 NOTE — Patient Instructions (Addendum)
Medication Instructions:  Your physician recommends that you continue on your current medications as directed. Please refer to the Current Medication list given to you today.   Labwork: None Ordered   Testing/Procedures: None Ordered   Follow-Up: Your physician recommends that you schedule a follow-up appointment in: 3 months with Dr. Acie Fredrickson - July 21 at 9:15 am   If you need a refill on your cardiac medications before your next appointment, please call your pharmacy.   Thank you for choosing [facility]! Christen Bame, RN [phone]

## 2015-10-10 ENCOUNTER — Ambulatory Visit: Payer: Medicare Other | Admitting: Cardiovascular Disease

## 2015-10-21 ENCOUNTER — Ambulatory Visit: Payer: Medicare Other | Admitting: Cardiology

## 2015-11-17 ENCOUNTER — Other Ambulatory Visit: Payer: Self-pay | Admitting: Cardiovascular Disease

## 2015-11-17 NOTE — Progress Notes (Signed)
Cardiology Office Note:    Date:  11/18/2015   ID:  [name], DOB June 18, 1932, MRN Kayla Medina  PCP:  Marton Redwood, MD  Cardiologist:  Dr. Liam Rogers   Electrophysiologist:  n/a  Referring MD: Marton Redwood, MD   Chief Complaint  Patient presents with  . Follow-up    CHF    History of Present Illness:    Kayla Medina is a 82 y.o. female with a hx of systolic CHF, Tako-Tsubo CM, HTN, LBBB, DM.  Last seen by Dr. Liam Rogers in 4/17.  EF was 40% at the time of her cath in 2/16.  Her LVF has worsened with EF of 25-30% by Echo in 6/16.  Here for FU. She is doing well.  She gets short of breath with more mod to extreme activities.  She denies chest pain or syncope.  She denies edema, orthopnea, PND.  She does note a dry cough if she misses her lasix.  We were to discuss changing Valsartan to [facility] today.  However, she ultimately noted today that she does not think she takes this anymore.  She thinks it may have made her feel sick.    Prior CV studies that were reviewed today include:    Echo 6/16 EF 25-30%, diff HK, mod MR, PASP 41 mmHg  LHC 2/16 Coronary angiography: Coronary dominance: right  Left Main:  Normal  Left Anterior Descending (LAD):  Normal in size with minor irregularities.  1st diagonal (D1):  Very large in size with minor irregularities. This supplies second and third diagonal distribution.    Circumflex (LCx):   Normal in size and nondominant. There is a 20% stenosis in the midsegment.  1st obtuse marginal:  Small in size with minor irregularities.  2nd obtuse marginal:  Medium in size with minor irregularities.  3rd obtuse marginal:  Normal in size with no significant disease.                   Right Coronary Artery: Normal in size and dominant. The vessel has no significant disease.  Posterior descending artery: Normal normal  Posterior AV segment: Normal  Posterolateral branchs:  2 posterolateral branches which are normal Left  ventriculography: Left ventricular systolic function is mildly to moderately reduced , LVEF is estimated at 40 %, there is no significant mitral regurgitation . There is severe hypokinesis of the distal anterior wall, apical and distal inferior wall Final Conclusions:   1. No evidence of obstructive coronary artery disease. 2. Mild to moderately reduced LV systolic function with left ventricular angiography suggestive of stress-induced cardiomyopathy. 3. Mildly elevated left ventricular end-diastolic pressure.   Past Medical History:  Diagnosis Date  . Anal fissure   . Chest pain, unspecified   . Diabetes mellitus without complication ([place])   . Glaucoma    pt. states no-glaucoma 07/20/13  . Hypertension   . Other and unspecified hyperlipidemia   . Palpitations   . Pneumonia   . PVC (premature ventricular contraction)   . Rosacea   . Shingles    hx  . Shortness of breath   . Subclinical hypothyroidism     Past Surgical History:  Procedure Laterality Date  . APPENDECTOMY    . bilateral eye surgery/cataract repair  3/09  . BLADDER REPAIR    . BOWEL RESECTION    . LEFT HEART CATHETERIZATION WITH CORONARY ANGIOGRAM N/A 04/24/2014   Procedure: LEFT HEART CATHETERIZATION WITH CORONARY ANGIOGRAM;  Surgeon: Wellington Hampshire, MD;  Location: [facility]  CATH LAB;  Service: Cardiovascular;  Laterality: N/A;  . TOTAL ABDOMINAL HYSTERECTOMY      Current Medications: Outpatient Medications Prior to Visit  Medication Sig Dispense Refill  . ALPRAZolam (XANAX) 0.5 MG tablet Take 0.5 mg by mouth at bedtime as needed for anxiety.    Marland Kitchen aspirin EC 81 MG EC tablet Take 1 tablet (81 mg total) by mouth daily. 30 tablet 0  . carvedilol (COREG) 12.5 MG tablet TAKE ONE TABLET BY MOUTH TWICE DAILY WITH A MEAL 180 tablet 2  . desonide (DESOWEN) 0.05 % cream Apply 1 application topically 2 (two) times daily as needed (for itching/dry skin).    . furosemide (LASIX) 40 MG tablet Take 0.5 tablets (20 mg total) by mouth  daily. 30 tablet 11  . meclizine (ANTIVERT) 25 MG tablet Take 1 tablet (25 mg total) by mouth 3 (three) times daily as needed for dizziness. 30 tablet 0  . metFORMIN (GLUCOPHAGE) 1000 MG tablet Take 1 tablet (1,000 mg total) by mouth 2 (two) times daily.    . Multiple Vitamin (MULTIVITAMIN WITH MINERALS) TABS tablet Take 1 tablet by mouth daily.    Marland Kitchen spironolactone (ALDACTONE) 25 MG tablet Take 0.5 tablets (12.5 mg total) by mouth daily. 45 tablet 3  . VITAMIN A PO Take 1 tablet by mouth daily.    . valsartan (DIOVAN) 80 MG tablet TAKE ONE TABLET BY MOUTH ONCE DAILY 90 tablet 2  . atorvastatin (LIPITOR) 10 MG tablet Take 1 tablet (10 mg total) by mouth daily. (Patient not taking: Reported on 11/18/2015) 90 tablet 3  . carvedilol (COREG) 12.5 MG tablet Take 1 tablet (12.5 mg total) by mouth 2 (two) times daily with a meal. 180 tablet 3  . Coenzyme Q10 (CO Q-10) 30 MG CAPS Take 30 mg by mouth daily.      No facility-administered medications prior to visit.       Allergies:   Codeine; Januvia [sitagliptin]; Other; Penicillins; and Sulfonamide derivatives   Social History   Social History  . Marital status: Married    Spouse name: N/A  . Number of children: 6  . Years of education: N/A   Occupational History  . CNA    Social History Main Topics  . Smoking status: Never Smoker  . Smokeless tobacco: Never Used  . Alcohol use No  . Drug use: No  . Sexual activity: Not Asked   Other Topics Concern  . 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   ROS:   Please  see the history of present illness.    ROS All other systems reviewed and are negative.   EKGs/Labs/Other Test Reviewed:    EKG:  EKG is  ordered today.  The ekg ordered today demonstrates NSR, HR 74, LBBB  Recent Labs: 06/02/2015: B Natriuretic Peptide 139.3; BUN 19; Creatinine, Ser 1.04; Hemoglobin 12.4; Platelets 306; Potassium 4.2; Sodium 142   Recent Lipid Panel    Component Value Date/Time   CHOL 231 (H) 04/23/2014 0045   TRIG 297 (H) 04/23/2014 0045   HDL 45 04/23/2014 0045   CHOLHDL 5.1 04/23/2014 0045  VLDL 59 (H) 04/23/2014 0045   LDLCALC 127 (H) 04/23/2014 0045     Physical Exam:    VS:  BP 118/60   Pulse 74   Ht 5\' 6"  (1.676 m)   Wt 156 lb 12.8 oz (71.1 kg)   BMI 25.31 kg/m     Wt Readings from Last 3 Encounters:  11/18/15 156 lb 12.8 oz (71.1 kg)  07/07/15 159 lb (72.1 kg)  04/08/15 163 lb 1.9 oz (74 kg)     Physical Exam  Constitutional: She is oriented to person, place, and time. She appears well-developed and well-nourished. No distress.  HENT:  Head: Normocephalic and atraumatic.  Eyes: No scleral icterus.  Neck: No JVD present.  Cardiovascular: Normal rate, regular rhythm and normal heart sounds.   No murmur heard. Pulmonary/Chest: Effort normal and breath sounds normal. She has no wheezes. She has no rales.  Abdominal: Soft. There is no tenderness.  Musculoskeletal: She exhibits no edema.  Neurological: She is alert and oriented to person, place, and time.  Skin: Skin is warm and dry.  Psychiatric: She has a normal mood and affect.    ASSESSMENT:    1. Chronic systolic CHF (congestive heart failure) ([place])   2. Takotsubo syndrome   3. Hyperlipidemia   4. Essential hypertension    PLAN:    In order of problems listed above:  1. Chronic systolic CHF - She is overall NYHA 2.  She has an EF < 35% and would benefit from [place].  However, she does not think that she takes Valsartan.  I will try to start her on Losartan 25 mg QD.  If she  discovers that she is taking Valsartan, will continue that and DC the Losartan Rx.  Continue Coreg, Spironolactone.  FU in 1 month.  Consider remaining on Losartan vs changing to [facility].  She thinks that the Valsartan may have made her BP too low.  I suspect she will probably not be able to tolerate Entresto.  2. Tako-Tsubo CM - EF 25-30% by last echo. Will continue to work on adjusting medications and consider arranging FU echo at next [place].  3. HL - Managed by PCP. Continue statin.   4. HTN - BP controlled.  Medications will be adjusted for CHF.    Medication Adjustments/Labs and Tests Ordered: Current medicines are reviewed at length with the patient today.  Concerns regarding medicines are outlined above.  Medication changes, Labs and Tests ordered today are outlined in the Patient Instructions noted below. Patient Instructions  Medication Instructions:  Please check your medications when you get home. Let me know if you are taking Valsartan (Diovan) 80 mg Once daily or not. If you ARE NOT taking Valsartan, I want you to start Losartan 25 mg Once daily (I will send a prescription to your pharmacy).  Labwork: In 1 week - BMET.   Testing/Procedures: None   Follow-Up: Dr. Liam Rogers or Richardson Dopp, PA-C in 1 month.   Any Other Special Instructions Will Be Listed Below (If Applicable).  If you need a refill on your cardiac medications before your next appointment, please call your pharmacy.   Signed, Richardson Dopp, PA-C  11/18/2015 10:34 AM    [facility] [address] [place], [place], [place]  [address] Phone: [phone]; Fax: [phone]

## 2015-11-18 ENCOUNTER — Encounter: Payer: Self-pay | Admitting: Physician Assistant

## 2015-11-18 ENCOUNTER — Ambulatory Visit (INDEPENDENT_AMBULATORY_CARE_PROVIDER_SITE_OTHER): Payer: Medicare Other | Admitting: Physician Assistant

## 2015-11-18 VITALS — BP 118/60 | HR 74 | Ht 66.0 in | Wt 156.8 lb

## 2015-11-18 DIAGNOSIS — I5181 Takotsubo syndrome: Secondary | ICD-10-CM

## 2015-11-18 DIAGNOSIS — I5022 Chronic systolic (congestive) heart failure: Secondary | ICD-10-CM | POA: Diagnosis not present

## 2015-11-18 DIAGNOSIS — I10 Essential (primary) hypertension: Secondary | ICD-10-CM

## 2015-11-18 DIAGNOSIS — E785 Hyperlipidemia, unspecified: Secondary | ICD-10-CM

## 2015-11-18 MED ORDER — LOSARTAN POTASSIUM 25 MG PO TABS: 25.0000 mg | ORAL_TABLET | Freq: Every day | ORAL | 11 refills | Status: DC

## 2015-11-18 NOTE — Patient Instructions (Signed)
Medication Instructions:  Please check your medications when you get home. Let me know if you are taking Valsartan (Diovan) 80 mg Once daily or not. If you ARE NOT taking Valsartan, I want you to start Losartan 25 mg Once daily (I will send a prescription to your pharmacy).  Labwork: In 1 week - BMET.   Testing/Procedures: None   Follow-Up: Dr. Liam Rogers or Richardson Dopp, PA-C in 1 month.   Any Other Special Instructions Will Be Listed Below (If Applicable).  If you need a refill on your cardiac medications before your next appointment, please call your pharmacy.

## 2015-11-28 ENCOUNTER — Other Ambulatory Visit: Payer: Medicare Other | Admitting: *Deleted

## 2015-11-28 ENCOUNTER — Telehealth: Payer: Self-pay | Admitting: *Deleted

## 2015-11-28 DIAGNOSIS — I10 Essential (primary) hypertension: Secondary | ICD-10-CM

## 2015-11-28 DIAGNOSIS — I5022 Chronic systolic (congestive) heart failure: Secondary | ICD-10-CM

## 2015-11-28 LAB — BASIC METABOLIC PANEL
BUN: 22 mg/dL (ref 7–25)
CALCIUM: 9.3 mg/dL (ref 8.6–10.4)
CO2: 23 mmol/L (ref 20–31)
CREATININE: 1.05 mg/dL — AB (ref 0.60–0.88)
Chloride: 104 mmol/L (ref 98–110)
GLUCOSE: 146 mg/dL — AB (ref 65–99)
Potassium: 4.4 mmol/L (ref 3.5–5.3)
Sodium: 140 mmol/L (ref 135–146)

## 2015-11-28 NOTE — Telephone Encounter (Signed)
Lmtcb for lab results 

## 2015-12-01 NOTE — Telephone Encounter (Signed)
Pt notified of lab results by phone with verbal understanding.  

## 2016-01-12 ENCOUNTER — Ambulatory Visit (INDEPENDENT_AMBULATORY_CARE_PROVIDER_SITE_OTHER): Payer: Medicare Other | Admitting: Cardiovascular Disease

## 2016-01-12 ENCOUNTER — Encounter: Payer: Self-pay | Admitting: Cardiovascular Disease

## 2016-01-12 VITALS — BP 130/70 | HR 64 | Ht 66.0 in | Wt 157.8 lb

## 2016-01-12 DIAGNOSIS — I5023 Acute on chronic systolic (congestive) heart failure: Secondary | ICD-10-CM | POA: Diagnosis not present

## 2016-01-12 DIAGNOSIS — I10 Essential (primary) hypertension: Secondary | ICD-10-CM | POA: Diagnosis not present

## 2016-01-12 DIAGNOSIS — I5022 Chronic systolic (congestive) heart failure: Secondary | ICD-10-CM

## 2016-01-12 NOTE — Progress Notes (Signed)
Cardiology Office Note   Date:  01/12/2016   ID:  Kayla Medina, Kayla Medina 06-Nov-1932, MRN [id number]  PCP:  Marton Redwood, MD  Cardiologist:   Mertie Moores, MD   Chief Complaint  Patient presents with  . Follow-up    htn   1. Takotsubo Syndrome-   2.actue on chronic systolic CHF - EF 123XX123 -  3. Hypertension-  4. Diabetes mellitus 5. LBBB    Kayla Medina is a 82 y.o. female who presents for f/u of her mild MI - Takotsubo syndrome. Feels weak, no CP, Breathing is ok. Has some DOE .   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     Kayla Medina is a 82 y.o. female who presents for follow up of her Takotsubo.  BP at home is ok.  A bit elevated here. 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   September 10, 2014: Ms. Mechling EF has fallen ( unexpectedly) .  She was diagnosed with Takotsubo  syndrome and has been on a beta blocker and angiotensin blocker since that time. Her ejection fraction has fallen to 25-30%.  She was recently seen by Dr. Brigitte Pulse and had some hypertension. Amlodipine was added to her medical regimen at that time.  September 25, 2014:  Ms. Driggers is seen back today for follow up of her severe LV dysfunction She was started on valsartan ( instead of Losartan ) during her last visit. We increase her Coreg and DC'd the Amlodipine . Her BP has been better.  Breathing is better.   She had foot surgery 2 days ago.  Is getting along ok    October 30, 2014:   She is doing ok. No CP or dyspnea.   Nov. 28, 2016:  Kayla Medina is seen today for follow up of her Takotsubo syndrome and chronic Systolic CHF. Hx of HTN BP is up a bit today -  Ate some ham over Thanksgiving . She is short of breath at times.   Jan. 17, 2017: Has some dyspnea - particularly with exertion . Walking to the kitchen causes her to have dyspnea.  Is still eating lots of salt .   She craves salt.  Still eating lots of salty snacks.   July 07, 2015: Doing well Staying fairly  active Has some dyspnea.  Oct . 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 Anal fissure   . Chest pain, unspecified   . Diabetes mellitus without complication ([place])   . Glaucoma    pt. states no-glaucoma 07/20/13  . Hypertension   . Other and unspecified hyperlipidemia   . Palpitations   . Pneumonia   . PVC (premature ventricular contraction)   . Rosacea   . Shingles    hx  . Shortness of breath   . Subclinical hypothyroidism     Past Surgical History:  Procedure Laterality Date  . APPENDECTOMY    . bilateral eye surgery/cataract repair  3/09  . BLADDER REPAIR    . BOWEL RESECTION    . LEFT HEART CATHETERIZATION WITH CORONARY ANGIOGRAM N/A 04/24/2014   Procedure: LEFT HEART CATHETERIZATION WITH CORONARY ANGIOGRAM;  Surgeon: Wellington Hampshire, MD;  Location: [facility];  Service: Cardiovascular;  Laterality: N/A;  . TOTAL ABDOMINAL HYSTERECTOMY       Current Outpatient Prescriptions  Medication Sig  Dispense Refill  . ALPRAZolam (XANAX) 0.5 MG tablet Take 0.5 mg by mouth at bedtime as needed for anxiety.    Marland Kitchen aspirin EC 81 MG EC tablet Take 1 tablet (81 mg total) by mouth daily. 30 tablet 0  . atorvastatin (LIPITOR) 10 MG tablet TAKE 1/2 TABLET BY MOUTH DAILY    . carvedilol (COREG) 12.5 MG tablet TAKE ONE TABLET BY MOUTH TWICE DAILY WITH A MEAL 180 tablet 2  . desonide (DESOWEN) 0.05 % cream Apply 1 application topically 2 (two) times daily as needed (for itching/dry skin).    Marland Kitchen FLUZONE HIGH-DOSE 0.5 ML SUSY Inject as directed once.    . furosemide (LASIX) 40 MG tablet Take 0.5 tablets (20 mg total) by mouth daily. 30 tablet 11  . losartan (COZAAR) 25 MG tablet Take 1 tablet (25 mg total) by mouth daily. 30 tablet 11  . meclizine (ANTIVERT) 25 MG tablet Take 1 tablet (25 mg total) by mouth 3 (three) times daily as needed for dizziness. 30 tablet 0  . metFORMIN (GLUCOPHAGE) 1000 MG  tablet Take 1 tablet (1,000 mg total) by mouth 2 (two) times daily.    . Multiple Vitamin (MULTIVITAMIN WITH MINERALS) TABS tablet Take 1 tablet by mouth daily.    Marland Kitchen spironolactone (ALDACTONE) 25 MG tablet Take 0.5 tablets (12.5 mg total) by mouth daily. 45 tablet 3  . VITAMIN A PO Take 1 tablet by mouth daily.     No current facility-administered medications for this visit.     Allergies:   Codeine; Januvia [sitagliptin]; Other; Penicillins; and Sulfonamide derivatives    Social History:  The patient  reports that she has never smoked. She has never used smokeless tobacco. She reports that she does not drink alcohol or use drugs.   Family History:  The patient's family history includes Alcoholism in her father; Alzheimer's disease in her mother; Diabetes in her daughter, maternal grandmother, and paternal grandmother; Hypertension in her mother; Other in her sister; Prostate cancer in her brother; Seizures in her sister; Thyroid disease in her brother, daughter, mother, and son.    ROS:  Please see the history of present illness.    Review of Systems: Constitutional:  denies fever, chills, diaphoresis, appetite change and fatigue.  HEENT: denies photophobia, eye pain, redness, hearing loss, ear pain, congestion, sore throat, rhinorrhea, sneezing, neck pain, neck stiffness and tinnitus.  Respiratory: denies SOB, DOE, cough, chest tightness, and wheezing.  Cardiovascular: denies chest pain, palpitations and leg swelling.  Gastrointestinal: denies nausea, vomiting, abdominal pain, diarrhea, constipation, blood in stool.  Genitourinary: denies dysuria, urgency, frequency, hematuria, flank pain and difficulty urinating.  Musculoskeletal: denies  myalgias, back pain, joint swelling, arthralgias and gait problem.   Skin: denies pallor, rash and wound.  Neurological: denies dizziness, seizures, syncope, weakness, light-headedness, numbness and headaches.   Hematological: denies adenopathy, easy  bruising, personal or family bleeding history.  Psychiatric/ Behavioral: denies suicidal ideation, mood changes, confusion, nervousness, sleep disturbance and agitation.      All other systems are reviewed and negative.   PHYSICAL EXAM: VS:  BP 130/70 (BP Location: Right Arm, Patient Position: Sitting, Cuff Size: Normal)   Pulse 64   Ht 5\' 6"  (1.676 m)   Wt 157 lb 12.8 oz (71.6 kg)   BMI 25.47 kg/m  , BMI Body mass index is 25.47 kg/m. GEN: Well nourished, well developed, in no acute distress  HEENT: normal  Neck: no JVD, carotid bruits, or masses Cardiac: RRR; no murmurs, rubs, or gallops,no  edema  Respiratory:  clear to auscultation bilaterally, normal work of breathing GI: soft, nontender, nondistended, + BS MS: no deformity or atrophy  Skin: warm and dry, no rash Neuro:  Strength and sensation are intact Psych: normal  EKG:  EKG is not ordered today.  Recent Labs: 06/02/2015: B Natriuretic Peptide 139.3; Hemoglobin 12.4; Platelets 306 11/28/2015: BUN 22; Creat 1.05; Potassium 4.4; Sodium 140    Lipid Panel    Component Value Date/Time   CHOL 231 (H) 04/23/2014 0045   TRIG 297 (H) 04/23/2014 0045   HDL 45 04/23/2014 0045   CHOLHDL 5.1 04/23/2014 0045   VLDL 59 (H) 04/23/2014 0045   LDLCALC 127 (H) 04/23/2014 0045      Wt Readings from Last 3 Encounters:  01/12/16 157 lb 12.8 oz (71.6 kg)  11/18/15 156 lb 12.8 oz (71.1 kg)  07/07/15 159 lb (72.1 kg)      Other studies Reviewed: Additional studies/ records that were reviewed today include: . Review of the above records demonstrates:    ASSESSMENT AND PLAN:  1.  Takotsubo syndrome:    2.  Chronic systolic congestive heart failure: The patient presented to the hospital  with Takotsubo  syndrome.   Her left ventricle systolic function has decreased to EF of 25-30%. We discussed Entresto.   She  Still does not want to try it yet.  We'll repeat her echocardiogram. We'll continue discussion about this. She's  quite insistent that she tolerates them with low doses of medications.  She insists that she feels quite well and that she is 82 years old. I have to agree that she seems to be doing quite well and are not sure that we're going to benefit her by starting additional medications.    3. Essential hypertension:  Blood pressure is better.    Current medicines are reviewed at length with the patient today.  The patient does not have concerns regarding medicines.   Labs/ tests ordered today include:  No orders of the defined types were placed in this encounter.   Disposition:   FU with me in 3 months    Signed, Mertie Moores, MD  01/12/2016 5:08 PM    [facility] [address], [place], [place]  [address] Phone: [phone]; Fax: [phone]

## 2016-01-12 NOTE — Patient Instructions (Signed)
Medication Instructions:  Your physician recommends that you continue on your current medications as directed. Please refer to the Current Medication list given to you today.   Labwork: None Ordered   Testing/Procedures: Your physician has requested that you have an echocardiogram. Echocardiography is a painless test that uses sound waves to create images of your heart. It provides your doctor with information about the size and shape of your heart and how well your heart's chambers and valves are working. This procedure takes approximately one hour. There are no restrictions for this procedure.    Follow-Up: Your physician recommends that you schedule a follow-up appointment in: 3 months with Dr. Nahser.    If you need a refill on your cardiac medications before your next appointment, please call your pharmacy.   Thank you for choosing [facility]! Kesia Dalto, RN [phone]    

## 2016-02-03 ENCOUNTER — Other Ambulatory Visit: Payer: Self-pay

## 2016-02-03 ENCOUNTER — Ambulatory Visit (HOSPITAL_COMMUNITY): Payer: Medicare Other | Attending: Cardiovascular Disease

## 2016-02-03 DIAGNOSIS — E119 Type 2 diabetes mellitus without complications: Secondary | ICD-10-CM | POA: Insufficient documentation

## 2016-02-03 DIAGNOSIS — R079 Chest pain, unspecified: Secondary | ICD-10-CM | POA: Insufficient documentation

## 2016-02-03 DIAGNOSIS — I5022 Chronic systolic (congestive) heart failure: Secondary | ICD-10-CM | POA: Diagnosis not present

## 2016-02-03 DIAGNOSIS — I340 Nonrheumatic mitral (valve) insufficiency: Secondary | ICD-10-CM | POA: Insufficient documentation

## 2016-02-03 DIAGNOSIS — E785 Hyperlipidemia, unspecified: Secondary | ICD-10-CM | POA: Insufficient documentation

## 2016-02-03 DIAGNOSIS — I110 Hypertensive heart disease with heart failure: Secondary | ICD-10-CM | POA: Insufficient documentation

## 2016-02-03 DIAGNOSIS — R0600 Dyspnea, unspecified: Secondary | ICD-10-CM | POA: Insufficient documentation

## 2016-02-03 DIAGNOSIS — I5181 Takotsubo syndrome: Secondary | ICD-10-CM | POA: Insufficient documentation

## 2016-02-17 ENCOUNTER — Other Ambulatory Visit: Payer: Self-pay | Admitting: Cardiovascular Disease

## 2016-02-20 ENCOUNTER — Other Ambulatory Visit: Payer: Self-pay | Admitting: Nurse Practitioner

## 2016-02-20 DIAGNOSIS — I10 Essential (primary) hypertension: Secondary | ICD-10-CM

## 2016-02-20 MED ORDER — POTASSIUM CHLORIDE CRYS ER 10 MEQ PO TBCR: 10.0000 meq | EXTENDED_RELEASE_TABLET | Freq: Every day | ORAL | 3 refills | Status: DC

## 2016-02-20 NOTE — Progress Notes (Signed)
Received Rx request to verify patient is to take aldactone in addition to kdur.  Spoke with patient who states she has continue to take klor-con 10 meq in addition to aldactone and furosemide.  Her K+ level has been WNL.  Per Dr. Acie Fredrickson she is to continue this therapy.  I called [facility] and spoke with Janett Billow to make her aware to continue those prescriptions.   I called patient back to advise we will recheck lab work om 12/19.  She verbalized understanding and agreement with plan.

## 2016-03-09 ENCOUNTER — Other Ambulatory Visit: Payer: Medicare Other | Admitting: *Deleted

## 2016-03-09 DIAGNOSIS — I10 Essential (primary) hypertension: Secondary | ICD-10-CM

## 2016-03-09 LAB — BASIC METABOLIC PANEL
BUN: 21 mg/dL (ref 7–25)
CALCIUM: 8.9 mg/dL (ref 8.6–10.4)
CO2: 22 mmol/L (ref 20–31)
CREATININE: 1.15 mg/dL — AB (ref 0.60–0.88)
Chloride: 108 mmol/L (ref 98–110)
GLUCOSE: 185 mg/dL — AB (ref 65–99)
Potassium: 4.3 mmol/L (ref 3.5–5.3)
SODIUM: 141 mmol/L (ref 135–146)

## 2016-04-13 ENCOUNTER — Ambulatory Visit (INDEPENDENT_AMBULATORY_CARE_PROVIDER_SITE_OTHER): Payer: Medicare Other | Admitting: Cardiovascular Disease

## 2016-04-13 ENCOUNTER — Encounter: Payer: Self-pay | Admitting: Cardiovascular Disease

## 2016-04-13 VITALS — BP 138/68 | HR 73 | Ht 66.0 in | Wt 156.8 lb

## 2016-04-13 DIAGNOSIS — I5023 Acute on chronic systolic (congestive) heart failure: Secondary | ICD-10-CM | POA: Diagnosis not present

## 2016-04-13 DIAGNOSIS — I10 Essential (primary) hypertension: Secondary | ICD-10-CM

## 2016-04-13 MED ORDER — SACUBITRIL-VALSARTAN 24-26 MG PO TABS: 1.0000 | ORAL_TABLET | Freq: Two times a day (BID) | ORAL | 11 refills | Status: DC

## 2016-04-13 NOTE — Progress Notes (Signed)
Cardiology Office Note   Date:  04/13/2016   ID:  Kayla, Medina October 15, 1932, MRN [id number]  PCP:  Marton Redwood, MD  Cardiologist:   Mertie Moores, MD   Chief Complaint  Patient presents with  . Follow-up    Chronic systolic CHF, HTN   1. Takotsubo Syndrome-   2.actue on chronic systolic CHF - EF 123XX123 -  3. Hypertension-  4. Diabetes mellitus 5. LBBB    Kayla Medina is a 83 y.o. female who presents for f/u of her mild MI - Takotsubo syndrome. Feels weak, no CP, Breathing is ok. Has some DOE .   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     Kayla Medina is a 83 y.o. female who presents for follow up of her Takotsubo.  BP at home is ok.  A bit elevated here. 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   September 10, 2014: Kayla Medina EF has fallen ( unexpectedly) .  She was diagnosed with Takotsubo  syndrome and has been on a beta blocker and angiotensin blocker since that time. Her ejection fraction has fallen to 25-30%.  She was recently seen by Dr. Brigitte Pulse and had some hypertension. Amlodipine was added to her medical regimen at that time.  September 25, 2014:  Kayla Medina is seen back today for follow up of her severe LV dysfunction She was started on valsartan ( instead of Losartan ) during her last visit. We increase her Coreg and DC'd the Amlodipine . Her BP has been better.  Breathing is better.   She had foot surgery 2 days ago.  Is getting along ok    October 30, 2014:   She is doing ok. No CP or dyspnea.   Nov. 28, 2016:  Kayla Medina is seen today for follow up of her Takotsubo syndrome and chronic Systolic CHF. Hx of HTN BP is up a bit today -  Ate some ham over Thanksgiving . She is short of breath at times.   Jan. 17, 2017: Has some dyspnea - particularly with exertion . Walking to the kitchen causes her to have dyspnea.  Is still eating lots of salt .   She craves salt.  Still eating lots of salty snacks.   July 07, 2015: Doing  well Staying fairly active Has some dyspnea.  Oct . 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  Jan. 23, 2018:  Kayla Medina is doing ok.  Her last echocardiogram 02/03/2016 shows slight improvement of her left ventricle systolic function.  Her ejection fraction is now 40-45% range.  Tolerating the meds well.     Past Medical History:  Diagnosis Date  . Anal fissure   . Chest pain, unspecified   . Diabetes mellitus without complication ([place])   . Glaucoma    pt. states no-glaucoma 07/20/13  . Hypertension   . Other and unspecified hyperlipidemia   . Palpitations   . Pneumonia   . PVC (premature ventricular contraction)   . Rosacea   . Shingles    hx  . Shortness of breath   . Subclinical hypothyroidism     Past Surgical History:  Procedure Laterality Date  . APPENDECTOMY    . bilateral eye surgery/cataract repair  3/09  . BLADDER REPAIR    . BOWEL RESECTION    . LEFT HEART CATHETERIZATION WITH CORONARY ANGIOGRAM N/A 04/24/2014  Procedure: LEFT HEART CATHETERIZATION WITH CORONARY ANGIOGRAM;  Surgeon: Wellington Hampshire, MD;  Location: [facility];  Service: Cardiovascular;  Laterality: N/A;  . TOTAL ABDOMINAL HYSTERECTOMY       Current Outpatient Prescriptions  Medication Sig Dispense Refill  . ALPRAZolam (XANAX) 0.5 MG tablet Take 0.5 mg by mouth at bedtime as needed for anxiety.    Marland Kitchen aspirin EC 81 MG EC tablet Take 1 tablet (81 mg total) by mouth daily. 30 tablet 0  . atorvastatin (LIPITOR) 10 MG tablet TAKE 1/2 TABLET BY MOUTH DAILY    . carvedilol (COREG) 12.5 MG tablet TAKE ONE TABLET BY MOUTH TWICE DAILY WITH A MEAL 180 tablet 2  . desonide (DESOWEN) 0.05 % cream Apply 1 application topically 2 (two) times daily as needed (for itching/dry skin).    Marland Kitchen FLUZONE HIGH-DOSE 0.5 ML SUSY Inject as directed once.    . furosemide (LASIX) 40 MG tablet Take 0.5 tablets (20 mg total) by mouth daily. 30 tablet 11  . losartan (COZAAR)  25 MG tablet Take 1 tablet (25 mg total) by mouth daily. 30 tablet 11  . meclizine (ANTIVERT) 25 MG tablet Take 1 tablet (25 mg total) by mouth 3 (three) times daily as needed for dizziness. 30 tablet 0  . metFORMIN (GLUCOPHAGE) 1000 MG tablet Take 1 tablet (1,000 mg total) by mouth 2 (two) times daily.    . Multiple Vitamin (MULTIVITAMIN WITH MINERALS) TABS tablet Take 1 tablet by mouth daily.    . potassium chloride SA (K-DUR,KLOR-CON) 10 MEQ tablet Take 1 tablet (10 mEq total) by mouth daily. 90 tablet 3  . spironolactone (ALDACTONE) 25 MG tablet TAKE ONE-HALF TABLET BY MOUTH ONCE DAILY. 45 tablet 3  . VITAMIN A PO Take 1 tablet by mouth daily.     No current facility-administered medications for this visit.     Allergies:   Codeine; Januvia [sitagliptin]; Other; Penicillins; and Sulfonamide derivatives    Social History:  The patient  reports that she has never smoked. She has never used smokeless tobacco. She reports that she does not drink alcohol or use drugs.   Family History:  The patient's family history includes Alcoholism in her father; Alzheimer's disease in her mother; Diabetes in her daughter, maternal grandmother, and paternal grandmother; Hypertension in her mother; Other in her sister; Prostate cancer in her brother; Seizures in her sister; Thyroid disease in her brother, daughter, mother, and son.    ROS:  Please see the history of present illness.    Review of Systems: Constitutional:  denies fever, chills, diaphoresis, appetite change and fatigue.  HEENT: denies photophobia, eye pain, redness, hearing loss, ear pain, congestion, sore throat, rhinorrhea, sneezing, neck pain, neck stiffness and tinnitus.  Respiratory: denies SOB, DOE, cough, chest tightness, and wheezing.  Cardiovascular: denies chest pain, palpitations and leg swelling.  Gastrointestinal: denies nausea, vomiting, abdominal pain, diarrhea, constipation, blood in stool.  Genitourinary: denies dysuria,  urgency, frequency, hematuria, flank pain and difficulty urinating.  Musculoskeletal: denies  myalgias, back pain, joint swelling, arthralgias and gait problem.   Skin: denies pallor, rash and wound.  Neurological: denies dizziness, seizures, syncope, weakness, light-headedness, numbness and headaches.   Hematological: denies adenopathy, easy bruising, personal or family bleeding history.  Psychiatric/ Behavioral: denies suicidal ideation, mood changes, confusion, nervousness, sleep disturbance and agitation.      All other systems are reviewed and negative.   PHYSICAL EXAM: VS:  BP 138/68   Pulse 73   Ht 5\' 6"  (1.676 m)  Wt 156 lb 12.8 oz (71.1 kg)   SpO2 95%   BMI 25.31 kg/m  , BMI Body mass index is 25.31 kg/m. GEN: Well nourished, well developed, in no acute distress  HEENT: normal  Neck: no JVD, carotid bruits, or masses Cardiac: RRR; no murmurs, rubs, or gallops,no edema  Respiratory:  clear to auscultation bilaterally, normal work of breathing GI: soft, nontender, nondistended, + BS MS: no deformity or atrophy  Skin: warm and dry, no rash Neuro:  Strength and sensation are intact Psych: normal  EKG:  EKG is not ordered today.  Recent Labs: 06/02/2015: B Natriuretic Peptide 139.3; Hemoglobin 12.4; Platelets 306 03/09/2016: BUN 21; Creat 1.15; Potassium 4.3; Sodium 141    Lipid Panel    Component Value Date/Time   CHOL 231 (H) 04/23/2014 0045   TRIG 297 (H) 04/23/2014 0045   HDL 45 04/23/2014 0045   CHOLHDL 5.1 04/23/2014 0045   VLDL 59 (H) 04/23/2014 0045   LDLCALC 127 (H) 04/23/2014 0045      Wt Readings from Last 3 Encounters:  04/13/16 156 lb 12.8 oz (71.1 kg)  01/12/16 157 lb 12.8 oz (71.6 kg)  11/18/15 156 lb 12.8 oz (71.1 kg)      Other studies Reviewed: Additional studies/ records that were reviewed today include: . Review of the above records demonstrates:    ASSESSMENT AND PLAN:  1.  Takotsubo syndrome:    2.  Chronic systolic  congestive heart failure: The patient presented to the hospital  with Takotsubo  syndrome.   Her left ventricle systolic function has decreased to EF of 25-30%.  She's now we will start Estresto  24 mg/26 mg twice a day. We'll discontinue the losartan. My nurse will see her  in 2 weeks for a very quick office visit, BP check and BMP .    3. Essential hypertension:  Blood pressure is better.    Current medicines are reviewed at length with the patient today.  The patient does not have concerns regarding medicines.   Labs/ tests ordered today include:  No orders of the defined types were placed in this encounter.   Disposition:   FU with [place]  in 2 weeks.    Signed, Mertie Moores, MD  04/13/2016 9:03 AM    [facility] [place], [place], [place]  [address] Phone: [phone]; Fax: [phone]

## 2016-04-13 NOTE — Patient Instructions (Signed)
Medication Instructions:  STOP Losartan START Entresto 24/26 mg twice daily   Labwork: Your physician recommends that you return for basic metabolic panel on Tuesday Feb. 6   Testing/Procedures: None Ordered   Follow-Up: Your physician recommends that you return for a follow up visit with Nurse/BP Check on Tuesday Feb. 6 at 1:00 pm    If you need a refill on your cardiac medications before your next appointment, please call your pharmacy.   Thank you for choosing [facility]! Christen Bame, RN [phone]

## 2016-04-27 ENCOUNTER — Ambulatory Visit (INDEPENDENT_AMBULATORY_CARE_PROVIDER_SITE_OTHER): Payer: Medicare Other | Admitting: Nurse Practitioner

## 2016-04-27 VITALS — BP 130/70 | HR 78 | Resp 16 | Ht 66.0 in

## 2016-04-27 DIAGNOSIS — I10 Essential (primary) hypertension: Secondary | ICD-10-CM

## 2016-04-27 DIAGNOSIS — E7800 Pure hypercholesterolemia, unspecified: Secondary | ICD-10-CM | POA: Diagnosis not present

## 2016-04-27 NOTE — Progress Notes (Signed)
1.) Reason for visit: BP check since starting Entresto 1/23   2.) Name of MD requesting visit: Dr. Acie Fredrickson   3.) H&P: Dr. Acie Fredrickson started Delene Loll 24/26 mg at last office visit on 1/23 and patient was advised to return today for BP check and BMET   4.) ROS related to problem: patient denies complaints, states she started the medication 1/2 pill x 1 week then 1 pill daily. She is confused about her medications and may have stopped the wrong medication. She will call me with a list of her medications and will follow d/c instructions from today regarding medical therapy  5.) Assessment and plan per MD: Follow up in 3-4 weeks with Dr. Acie Fredrickson

## 2016-04-27 NOTE — Patient Instructions (Signed)
Medication Instructions:  Make sure you review your medication list with your pill bottles  STOP Lasix (furosemide) STOP Losartan (Cozaar) CONTINUE Aldactone (SPIRONOLACTONE) CONTINUE ENTRESTO   Labwork: TODAY - basic metabolic panel WILL CALL YOU RESULTS AND WHETHER TO CONTINUE Potassium  Testing/Procedures: None Ordered   Follow-Up:   If you need a refill on your cardiac medications before your next appointment, please call your pharmacy.   Thank you for choosing [facility]! Christen Bame, RN [phone]

## 2016-04-28 ENCOUNTER — Telehealth: Payer: Self-pay | Admitting: Nurse Practitioner

## 2016-04-28 LAB — BASIC METABOLIC PANEL
BUN/Creatinine Ratio: 15 (ref 12–28)
BUN: 15 mg/dL (ref 8–27)
CO2: 21 mmol/L (ref 18–29)
Calcium: 9.6 mg/dL (ref 8.7–10.3)
Chloride: 103 mmol/L (ref 96–106)
Creatinine, Ser: 1.01 mg/dL — ABNORMAL HIGH (ref 0.57–1.00)
GFR calc Af Amer: 60 mL/min/{1.73_m2} (ref >59)
GFR calc non Af Amer: 52 mL/min/{1.73_m2} — ABNORMAL LOW (ref >59)
Glucose: 137 mg/dL — ABNORMAL HIGH (ref 65–99)
Potassium: 4.2 mmol/L (ref 3.5–5.2)
Sodium: 142 mmol/L (ref 134–144)

## 2016-04-28 NOTE — Telephone Encounter (Signed)
Reviewed lab results with patient. She verified that she had accidentally stopped aldactone instead of losartan following last office visit. She states she has not taken potassium or lasix in >30 days so I have removed these from her list. She will resume spironolactone today. She is scheduled for follow up on 2/27 and will get repeat bmet at that time. She states she has multiple doctor's appointments in the next month and it is difficult to come in for additional lab work with her work schedule. She repeated medication therapy to me and thanked me for the call.

## 2016-04-28 NOTE — Telephone Encounter (Signed)
-----   Message from Thayer Headings, MD sent at 04/28/2016  8:08 AM EST ----- Labs are stable. Will need to find out if she was taking her meds correctly

## 2016-05-18 ENCOUNTER — Ambulatory Visit (INDEPENDENT_AMBULATORY_CARE_PROVIDER_SITE_OTHER): Payer: Medicare Other | Admitting: Cardiovascular Disease

## 2016-05-18 ENCOUNTER — Encounter: Payer: Self-pay | Admitting: Cardiovascular Disease

## 2016-05-18 VITALS — BP 116/60 | HR 66 | Ht 66.0 in | Wt 161.1 lb

## 2016-05-18 DIAGNOSIS — I5023 Acute on chronic systolic (congestive) heart failure: Secondary | ICD-10-CM

## 2016-05-18 DIAGNOSIS — I5022 Chronic systolic (congestive) heart failure: Secondary | ICD-10-CM | POA: Diagnosis not present

## 2016-05-18 DIAGNOSIS — I10 Essential (primary) hypertension: Secondary | ICD-10-CM

## 2016-05-18 NOTE — Progress Notes (Signed)
Cardiology Office Note   Date:  05/18/2016   ID:  Kayla Medina, Kayla Medina [birth year], MRN [id number]  PCP:  Marton Redwood, MD  Cardiologist:   Mertie Moores, MD   Chief Complaint  Patient presents with  . Follow-up    CHF   1. Takotsubo Syndrome-   2.actue on chronic systolic CHF - EF [id number] - , now 40-45 %   3. Hypertension-  4. Diabetes mellitus 5. LBBB    Kayla Medina is a 83 y.o. female who presents for f/u of her mild MI - Takotsubo syndrome. Feels weak, no CP, Breathing is ok. Has some DOE .   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     Kayla Medina is a 83 y.o. female who presents for follow up of her Takotsubo.  BP at home is ok.  A bit elevated here. 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   September 10, 2014: Kayla Medina EF has fallen ( unexpectedly) .  She was diagnosed with Takotsubo  syndrome and has been on a beta blocker and angiotensin blocker since that time. Her ejection fraction has fallen to 25-30%.  She was recently seen by Dr. Brigitte Pulse and had some hypertension. Amlodipine was added to her medical regimen at that time.  September 25, 2014:  Kayla Medina is seen back today for follow up of her severe LV dysfunction She was started on valsartan ( instead of Losartan ) during her last visit. We increase her Coreg and DC'd the Amlodipine . Her BP has been better.  Breathing is better.   She had foot surgery 2 days ago.  Is getting along ok    October 30, 2014:   She is doing ok. No CP or dyspnea.   Nov. 28, 2016:  Kayla Medina is seen today for follow up of her Takotsubo syndrome and chronic Systolic CHF. Hx of HTN BP is up a bit today -  Ate some ham over Thanksgiving . She is short of breath at times.   Jan. 17, 2017: Has some dyspnea - particularly with exertion . Walking to the kitchen causes her to have dyspnea.  Is still eating lots of salt .   She craves salt.  Still eating lots of salty snacks.   July 07, 2015: Doing well Staying  fairly active Has some dyspnea.  Oct . 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  Jan. 23, 2018:  Kayla Medina is doing ok.  Her last echocardiogram 02/03/2016 shows slight improvement of her left ventricle systolic function.  Her ejection fraction is now 40-45% range.  Tolerating the meds well.    Feb. 27, 2018:  Kayla Medina is seen today for Entresto follow up  Seems to be tolerating the Entresto She stopped the Spironolactone - she had orthostasis.     Past Medical History:  Diagnosis Date  . Anal fissure   . Chest pain, unspecified   . Diabetes mellitus without complication ([place])   . Glaucoma    pt. states no-glaucoma 07/20/13  . Hypertension   . Other and unspecified hyperlipidemia   . Palpitations   . Pneumonia   . PVC (premature ventricular contraction)   . Rosacea   . Shingles    hx  . Shortness of breath   . Subclinical hypothyroidism     Past Surgical History:  Procedure Laterality Date  . APPENDECTOMY    .  bilateral eye surgery/cataract repair  3/09  . BLADDER REPAIR    . BOWEL RESECTION    . LEFT HEART CATHETERIZATION WITH CORONARY ANGIOGRAM N/A 04/24/2014   Procedure: LEFT HEART CATHETERIZATION WITH CORONARY ANGIOGRAM;  Surgeon: Wellington Hampshire, MD;  Location: [facility] C[facility];  Service: Cardiovascular;  Laterality: N/A;  . TOTAL ABDOMINAL HYSTERECTOMY       Current Outpatient Prescriptions  Medication Sig Dispense Refill  . ALPRAZolam (XANAX) 0.5 MG tablet Take 0.5 mg by mouth at bedtime as needed for anxiety.    Marland Kitchen aspirin EC 81 MG EC tablet Take 1 tablet (81 mg total) by mouth daily. 30 tablet 0  . atorvastatin (LIPITOR) 10 MG tablet Take 10 mg by mouth daily at 6 PM.     . carvedilol (COREG) 12.5 MG tablet TAKE ONE TABLET BY MOUTH TWICE DAILY WITH A MEAL 180 tablet 2  . desonide (DESOWEN) 0.05 % cream Apply 1 application topically 2 (two) times daily as needed (for itching/dry skin).    Marland Kitchen FLUZONE HIGH-DOSE 0.5  ML SUSY Inject as directed once.    . meclizine (ANTIVERT) 25 MG tablet Take 1 tablet (25 mg total) by mouth 3 (three) times daily as needed for dizziness. 30 tablet 0  . metFORMIN (GLUCOPHAGE) 1000 MG tablet Take 1 tablet (1,000 mg total) by mouth 2 (two) times daily.    . Multiple Vitamin (MULTIVITAMIN WITH MINERALS) TABS tablet Take 1 tablet by mouth daily.    . sacubitril-valsartan (ENTRESTO) 24-26 MG Take 1 tablet by mouth 2 (two) times daily. 60 tablet 11  . spironolactone (ALDACTONE) 25 MG tablet TAKE ONE-HALF TABLET BY MOUTH ONCE DAILY. 45 tablet 3  . SYNTHROID 50 MCG tablet Take 50 mcg by mouth daily.    Marland Kitchen VITAMIN A PO Take 1 tablet by mouth daily.     No current facility-administered medications for this visit.     Allergies:   Codeine; Januvia [sitagliptin]; Other; Penicillins; and Sulfonamide derivatives    Social History:  The patient  reports that she has never smoked. She has never used smokeless tobacco. She reports that she does not drink alcohol or use drugs.   Family History:  The patient's family history includes Alcoholism in her father; Alzheimer's disease in her mother; Diabetes in her daughter, maternal grandmother, and paternal grandmother; Hypertension in her mother; Other in her sister; Prostate cancer in her brother; Seizures in her sister; Thyroid disease in her brother, daughter, mother, and son.    ROS:  Please see the history of present illness.    Review of Systems: Constitutional:  denies fever, chills, diaphoresis, appetite change and fatigue.  HEENT: denies photophobia, eye pain, redness, hearing loss, ear pain, congestion, sore throat, rhinorrhea, sneezing, neck pain, neck stiffness and tinnitus.  Respiratory: denies SOB, DOE, cough, chest tightness, and wheezing.  Cardiovascular: denies chest pain, palpitations and leg swelling.  Gastrointestinal: denies nausea, vomiting, abdominal pain, diarrhea, constipation, blood in stool.  Genitourinary: denies  dysuria, urgency, frequency, hematuria, flank pain and difficulty urinating.  Musculoskeletal: denies  myalgias, back pain, joint swelling, arthralgias and gait problem.   Skin: denies pallor, rash and wound.  Neurological: denies dizziness, seizures, syncope, weakness, light-headedness, numbness and headaches.   Hematological: denies adenopathy, easy bruising, personal or family bleeding history.  Psychiatric/ Behavioral: denies suicidal ideation, mood changes, confusion, nervousness, sleep disturbance and agitation.      All other systems are reviewed and negative.   PHYSICAL EXAM: VS:  BP 116/60 (BP Location: Right Arm,  Patient Position: Sitting, Cuff Size: Normal)   Pulse 66   Ht 5\' 6"  (1.676 m)   Wt 161 lb 1.9 oz (73.1 kg)   SpO2 95%   BMI 26.01 kg/m  , BMI Body mass index is 26.01 kg/m. GEN: Well nourished, well developed, in no acute distress  HEENT: normal  Neck: no JVD, carotid bruits, or masses Cardiac: RRR; no murmurs, rubs, or gallops,no edema  Respiratory:  clear to auscultation bilaterally, normal work of breathing GI: soft, nontender, nondistended, + BS MS: no deformity or atrophy  Skin: warm and dry, no rash Neuro:  Strength and sensation are intact Psych: normal  EKG:  EKG is not ordered today.  Recent Labs: 06/02/2015: B Natriuretic Peptide 139.3; Hemoglobin 12.4; Platelets 306 04/27/2016: BUN 15; Creatinine, Ser 1.01; Potassium 4.2; Sodium 142    Lipid Panel    Component Value Date/Time   CHOL 231 (H) 04/23/2014 0045   TRIG 297 (H) 04/23/2014 0045   HDL 45 04/23/2014 0045   CHOLHDL 5.1 04/23/2014 0045   VLDL 59 (H) 04/23/2014 0045   LDLCALC 127 (H) 04/23/2014 0045      Wt Readings from Last 3 Encounters:  05/18/16 161 lb 1.9 oz (73.1 kg)  04/13/16 156 lb 12.8 oz (71.1 kg)  01/12/16 157 lb 12.8 oz (71.6 kg)      Other studies Reviewed: Additional studies/ records that were reviewed today include: . Review of the above records demonstrates:     ASSESSMENT AND PLAN:  1.  Takotsubo syndrome:    2.  Chronic systolic congestive heart failure: The patient presented to the hospital  with Takotsubo  syndrome.   Her left ventricle systolic function has decreased to EF of 25-30%.  She has tolerated the Entresto  fairly well. She did stop her Aldactone because of orthostatic hypotension. I'll see her back in 3 months. We will get an echocardiogram at that time.   Clinically she seems to be doing quite a bit better.  3. Essential hypertension:  Blood pressure is better.    Current medicines are reviewed at length with the patient today.  The patient does not have concerns regarding medicines.   Labs/ tests ordered today include:  No orders of the defined types were placed in this encounter.   Disposition:   FU with [place]  In 3 months    Signed, Mertie Moores, MD  05/18/2016 9:55 AM    [facility] [place], [place], [place]  [address] Phone: [phone]; Fax: [phone]

## 2016-05-18 NOTE — Patient Instructions (Signed)
Medication Instructions:  Your physician recommends that you continue on your current medications as directed. Please refer to the Current Medication list given to you today.   Labwork: Your physician recommends that you return for lab work in: 3 months at next office visit for basic metabolic panel   Testing/Procedures: Your physician has requested that you have an echocardiogram in 3 months prior to your appointment with Dr. Acie Fredrickson. Echocardiography is a painless test that uses sound waves to create images of your heart. It provides your doctor with information about the size and shape of your heart and how well your heart's chambers and valves are working. This procedure takes approximately one hour. There are no restrictions for this procedure.   Follow-Up: Your physician recommends that you schedule a follow-up appointment in: 3 months with  Dr. Acie Fredrickson   If you need a refill on your cardiac medications before your next appointment, please call your pharmacy.   Thank you for choosing [facility]! Christen Bame, RN [phone]

## 2016-08-17 ENCOUNTER — Ambulatory Visit (HOSPITAL_COMMUNITY): Payer: Medicare Other | Attending: Cardiovascular Disease

## 2016-08-17 ENCOUNTER — Other Ambulatory Visit: Payer: Medicare Other

## 2016-08-17 ENCOUNTER — Other Ambulatory Visit: Payer: Self-pay

## 2016-08-17 DIAGNOSIS — I083 Combined rheumatic disorders of mitral, aortic and tricuspid valves: Secondary | ICD-10-CM | POA: Insufficient documentation

## 2016-08-17 DIAGNOSIS — I5022 Chronic systolic (congestive) heart failure: Secondary | ICD-10-CM

## 2016-08-17 DIAGNOSIS — I422 Other hypertrophic cardiomyopathy: Secondary | ICD-10-CM | POA: Insufficient documentation

## 2016-08-17 LAB — BASIC METABOLIC PANEL
BUN/Creatinine Ratio: 16 (ref 12–28)
BUN: 18 mg/dL (ref 8–27)
CALCIUM: 9.8 mg/dL (ref 8.7–10.3)
CO2: 23 mmol/L (ref 18–29)
CREATININE: 1.14 mg/dL — AB (ref 0.57–1.00)
Chloride: 104 mmol/L (ref 96–106)
GFR calc Af Amer: 52 mL/min/{1.73_m2} — ABNORMAL LOW (ref >59)
GFR calc non Af Amer: 45 mL/min/{1.73_m2} — ABNORMAL LOW (ref >59)
Glucose: 217 mg/dL — ABNORMAL HIGH (ref 65–99)
Potassium: 4.3 mmol/L (ref 3.5–5.2)
SODIUM: 142 mmol/L (ref 134–144)

## 2016-08-18 ENCOUNTER — Other Ambulatory Visit: Payer: Self-pay | Admitting: Cardiovascular Disease

## 2016-08-24 ENCOUNTER — Encounter: Payer: Self-pay | Admitting: Cardiovascular Disease

## 2016-08-24 ENCOUNTER — Ambulatory Visit (INDEPENDENT_AMBULATORY_CARE_PROVIDER_SITE_OTHER): Payer: Medicare Other | Admitting: Cardiovascular Disease

## 2016-08-24 VITALS — BP 136/78 | HR 72 | Ht 66.0 in | Wt 160.0 lb

## 2016-08-24 DIAGNOSIS — I10 Essential (primary) hypertension: Secondary | ICD-10-CM | POA: Diagnosis not present

## 2016-08-24 DIAGNOSIS — I5022 Chronic systolic (congestive) heart failure: Secondary | ICD-10-CM | POA: Diagnosis not present

## 2016-08-24 NOTE — Patient Instructions (Signed)
Medication Instructions:  Your physician recommends that you continue on your current medications as directed. Please refer to the Current Medication list given to you today.   Labwork: Your physician recommends that you return for lab work in: 6 months at next office visit for basic metabolic panel   Testing/Procedures: None Ordered   Follow-Up: Your physician wants you to follow-up in: 6 months with Dr. Nahser.  You will receive a reminder letter in the mail two months in advance. If you don't receive a letter, please call our office to schedule the follow-up appointment.   If you need a refill on your cardiac medications before your next appointment, please call your pharmacy.   Thank you for choosing [facility]! Michelle Swinyer, RN [phone]    

## 2016-08-24 NOTE — Progress Notes (Signed)
Cardiology Office Note   Date:  08/24/2016   ID:  Kayla Medina, Kayla Medina 04/22/1932, MRN [id number]  PCP:  Marton Redwood, MD  Cardiologist:   Mertie Moores, MD   Chief Complaint  Patient presents with  . Follow-up    CHF   1. Takotsubo Syndrome-   2.actue on chronic systolic CHF - EF 06-26% - , now 40-45 %   3. Hypertension-  4. Diabetes mellitus 5. LBBB    Kayla Medina is a 83 y.o. female who presents for f/u of her mild MI - Takotsubo syndrome. Feels weak, no CP, Breathing is ok. Has some DOE .   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     Kayla Medina is a 83 y.o. female who presents for follow up of her Takotsubo.  BP at home is ok.  A bit elevated here. 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   September 10, 2014: Kayla Medina EF has fallen ( unexpectedly) .  She was diagnosed with Takotsubo  syndrome and has been on a beta blocker and angiotensin blocker since that time. Her ejection fraction has fallen to 25-30%.  She was recently seen by Dr. Brigitte Pulse and had some hypertension. Amlodipine was added to her medical regimen at that time.  September 25, 2014:  Kayla Medina is seen back today for follow up of her severe LV dysfunction She was started on valsartan ( instead of Losartan ) during her last visit. We increase her Coreg and DC'd the Amlodipine . Her BP has been better.  Breathing is better.   She had foot surgery 2 days ago.  Is getting along ok    October 30, 2014:   She is doing ok. No CP or dyspnea.   Nov. 28, 2016:  Kayla Medina is seen today for follow up of her Takotsubo syndrome and chronic Systolic CHF. Hx of HTN BP is up a bit today -  Ate some ham over Thanksgiving . She is short of breath at times.   Jan. 17, 2017: Has some dyspnea - particularly with exertion . Walking to the kitchen causes her to have dyspnea.  Is still eating lots of salt .   She craves salt.  Still eating lots of salty snacks.   July 07, 2015: Doing well Staying  fairly active Has some dyspnea.  Oct . 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  Jan. 23, 2018:  Kayla Medina is doing ok.  Her last echocardiogram 02/03/2016 shows slight improvement of her left ventricle systolic function.  Her ejection fraction is now 40-45% range.  Tolerating the meds well.    Feb. 27, 2018:  Kayla Medina is seen today for Entresto follow up  Seems to be tolerating the Entresto She stopped the Spironolactone - she had orthostasis.   August 24, 2016 Kayla Medina is doing well.  Recent echo shows stable EF - 40-45%.  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 Anal fissure   . Chest pain, unspecified   . Diabetes mellitus without complication ([place])   . Glaucoma    pt. states no-glaucoma 07/20/13  . Hypertension   . Other and unspecified hyperlipidemia   . Palpitations   . Pneumonia   . PVC (premature ventricular contraction)   . Rosacea   . Shingles  hx  . Shortness of breath   . Subclinical hypothyroidism     Past Surgical History:  Procedure Laterality Date  . APPENDECTOMY    . bilateral eye surgery/cataract repair  3/09  . BLADDER REPAIR    . BOWEL RESECTION    . LEFT HEART CATHETERIZATION WITH CORONARY ANGIOGRAM N/A 04/24/2014   Procedure: LEFT HEART CATHETERIZATION WITH CORONARY ANGIOGRAM;  Surgeon: Wellington Hampshire, MD;  Location: [facility] CATH [facility];  Service: Cardiovascular;  Laterality: N/A;  . TOTAL ABDOMINAL HYSTERECTOMY       Current Outpatient Prescriptions  Medication Sig Dispense Refill  . ALPRAZolam (XANAX) 0.5 MG tablet Take 0.5 mg by mouth at bedtime as needed for anxiety.    Marland Kitchen aspirin EC 81 MG EC tablet Take 1 tablet (81 mg total) by mouth daily. 30 tablet 0  . atorvastatin (LIPITOR) 10 MG tablet Take 10 mg by mouth daily at 6 PM.     . carvedilol (COREG) 12.5 MG tablet TAKE ONE TABLET BY MOUTH TWICE DAILY WITH A MEAL 180 tablet 2  . desonide  (DESOWEN) 0.05 % cream Apply 1 application topically 2 (two) times daily as needed (for itching/dry skin).    Marland Kitchen FLUZONE HIGH-DOSE 0.5 ML SUSY Inject as directed once.    . meclizine (ANTIVERT) 25 MG tablet Take 1 tablet (25 mg total) by mouth 3 (three) times daily as needed for dizziness. 30 tablet 0  . metFORMIN (GLUCOPHAGE) 1000 MG tablet Take 1 tablet (1,000 mg total) by mouth 2 (two) times daily.    . Multiple Vitamin (MULTIVITAMIN WITH MINERALS) TABS tablet Take 1 tablet by mouth daily.    . sacubitril-valsartan (ENTRESTO) 24-26 MG Take 1 tablet by mouth 2 (two) times daily. 60 tablet 11  . spironolactone (ALDACTONE) 25 MG tablet TAKE ONE-HALF TABLET BY MOUTH ONCE DAILY. 45 tablet 3  . SYNTHROID 50 MCG tablet Take 50 mcg by mouth daily.    Marland Kitchen VITAMIN A PO Take 1 tablet by mouth daily.     No current facility-administered medications for this visit.     Allergies:   Codeine; Januvia [sitagliptin]; Other; Penicillins; and Sulfonamide derivatives    Social History:  The patient  reports that she has never smoked. She has never used smokeless tobacco. She reports that she does not drink alcohol or use drugs.   Family History:  The patient's family history includes Alcoholism in her father; Alzheimer's disease in her mother; Diabetes in her daughter, maternal grandmother, and paternal grandmother; Hypertension in her mother; Other in her sister; Prostate cancer in her brother; Seizures in her sister; Thyroid disease in her brother, daughter, mother, and son.    ROS:  Please see the history of present illness.    Review of Systems: Constitutional:  denies fever, chills, diaphoresis, appetite change and fatigue.  HEENT: denies photophobia, eye pain, redness, hearing loss, ear pain, congestion, sore throat, rhinorrhea, sneezing, neck pain, neck stiffness and tinnitus.  Respiratory: denies SOB, DOE, cough, chest tightness, and wheezing.  Cardiovascular: denies chest pain, palpitations and leg  swelling.  Gastrointestinal: denies nausea, vomiting, abdominal pain, diarrhea, constipation, blood in stool.  Genitourinary: denies dysuria, urgency, frequency, hematuria, flank pain and difficulty urinating.  Musculoskeletal: denies  myalgias, back pain, joint swelling, arthralgias and gait problem.   Skin: denies pallor, rash and wound.  Neurological: denies dizziness, seizures, syncope, weakness, light-headedness, numbness and headaches.   Hematological: denies adenopathy, easy bruising, personal or family bleeding history.  Psychiatric/ Behavioral: denies suicidal ideation, mood changes, confusion,  nervousness, sleep disturbance and agitation.      All other systems are reviewed and negative.   PHYSICAL EXAM: VS:  BP 136/78   Pulse 72   Ht 5\' 6"  (1.676 m)   Wt 160 lb (72.6 kg)   SpO2 96%   BMI 25.82 kg/m  , BMI Body mass index is 25.82 kg/m. GEN: Well nourished, well developed, in no acute distress  HEENT: normal  Neck: no JVD, carotid bruits, or masses Cardiac: RRR; no murmurs, rubs, or gallops,no edema  Respiratory:  clear to auscultation bilaterally, normal work of breathing GI: soft, nontender, nondistended, + BS MS: no deformity or atrophy  Skin: warm and dry, no rash Neuro:  Strength and sensation are intact Psych: normal  EKG:  EKG is ordered today.  NSR at 72.   LBBB with associated ST changes.   Recent Labs: 08/17/2016: BUN 18; Creatinine, Ser 1.14; Potassium 4.3; Sodium 142    Lipid Panel    Component Value Date/Time   CHOL 231 (H) 04/23/2014 0045   TRIG 297 (H) 04/23/2014 0045   HDL 45 04/23/2014 0045   CHOLHDL 5.1 04/23/2014 0045   VLDL 59 (H) 04/23/2014 0045   LDLCALC 127 (H) 04/23/2014 0045      Wt Readings from Last 3 Encounters:  08/24/16 160 lb (72.6 kg)  05/18/16 161 lb 1.9 oz (73.1 kg)  04/13/16 156 lb 12.8 oz (71.1 kg)      Other studies Reviewed: Additional studies/ records that were reviewed today include: . Review of the above  records demonstrates:    ASSESSMENT AND PLAN:  1.  Takotsubo syndrome:    2.  Chronic systolic congestive heart failure: The patient presented to the hospital  with Takotsubo  syndrome.   Her left ventricle systolic function has decreased to EF of 25-30%. EF has improved to 40-45%. She does not want to increase the Entresto. We will discuss it at her next visit .  Clinically she seems to be doing quite a bit better.  3. Essential hypertension:  Blood pressure is better.    Current medicines are reviewed at length with the patient today.  The patient does not have concerns regarding medicines.   Labs/ tests ordered today include:  No orders of the defined types were placed in this encounter.   Disposition:   FU with [place]  In 6  months    Signed, Mertie Moores, MD  08/24/2016 10:14 AM    [facility] [address], [place], [place]  [address] Phone: [phone]; Fax: [phone]

## 2017-02-22 ENCOUNTER — Encounter: Payer: Self-pay | Admitting: Cardiovascular Disease

## 2017-02-22 ENCOUNTER — Ambulatory Visit: Payer: Medicare Other | Admitting: Cardiovascular Disease

## 2017-02-22 VITALS — BP 124/62 | HR 63 | Ht 66.0 in | Wt 162.0 lb

## 2017-02-22 DIAGNOSIS — E782 Mixed hyperlipidemia: Secondary | ICD-10-CM

## 2017-02-22 DIAGNOSIS — I5022 Chronic systolic (congestive) heart failure: Secondary | ICD-10-CM

## 2017-02-22 LAB — BASIC METABOLIC PANEL
BUN / CREAT RATIO: 18 (ref 12–28)
BUN: 17 mg/dL (ref 8–27)
CHLORIDE: 103 mmol/L (ref 96–106)
CO2: 23 mmol/L (ref 20–29)
Calcium: 10.1 mg/dL (ref 8.7–10.3)
Creatinine, Ser: 0.96 mg/dL (ref 0.57–1.00)
GFR calc non Af Amer: 55 mL/min/{1.73_m2} — ABNORMAL LOW (ref >59)
GFR, EST AFRICAN AMERICAN: 64 mL/min/{1.73_m2} (ref >59)
GLUCOSE: 207 mg/dL — AB (ref 65–99)
POTASSIUM: 4.2 mmol/L (ref 3.5–5.2)
SODIUM: 141 mmol/L (ref 134–144)

## 2017-02-22 LAB — LIPID PANEL
CHOLESTEROL TOTAL: 147 mg/dL (ref 100–199)
Chol/HDL Ratio: 2.7 ratio (ref 0.0–4.4)
HDL: 55 mg/dL (ref >39)
LDL Calculated: 59 mg/dL (ref 0–99)
Triglycerides: 165 mg/dL — ABNORMAL HIGH (ref 0–149)
VLDL CHOLESTEROL CAL: 33 mg/dL (ref 5–40)

## 2017-02-22 LAB — HEPATIC FUNCTION PANEL
ALK PHOS: 49 IU/L (ref 39–117)
ALT: 9 IU/L (ref 0–32)
AST: 15 IU/L (ref 0–40)
Albumin: 4.2 g/dL (ref 3.5–4.7)
BILIRUBIN TOTAL: 0.3 mg/dL (ref 0.0–1.2)
BILIRUBIN, DIRECT: 0.1 mg/dL (ref 0.00–0.40)
Total Protein: 6.7 g/dL (ref 6.0–8.5)

## 2017-02-22 NOTE — Progress Notes (Signed)
Cardiology Office Note   Date:  02/22/2017   ID:  Fran, Neiswonger Aug 09, 1932, MRN [id number]  PCP:  Marton Redwood, MD  Cardiologist:   Mertie Moores, MD   Chief Complaint  Patient presents with  . Congestive Heart Failure   1. Takotsubo Syndrome-   2.actue on chronic systolic CHF - EF 54-62% - , now 40-45 %   3. Hypertension-  4. Diabetes mellitus 5. LBBB    MAYZIE CAUGHLIN is a 83 y.o. female who presents for f/u of her mild MI - Takotsubo syndrome. Feels weak, no CP, Breathing is ok. Has some DOE .   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     KATHERN LOBOSCO is a 83 y.o. female who presents for follow up of her Takotsubo.  BP at home is ok.  A bit elevated here. 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   September 10, 2014: Ms. Kuras EF has fallen ( unexpectedly) .  She was diagnosed with Takotsubo  syndrome and has been on a beta blocker and angiotensin blocker since that time. Her ejection fraction has fallen to 25-30%.  She was recently seen by Dr. Brigitte Pulse and had some hypertension. Amlodipine was added to her medical regimen at that time.  September 25, 2014:  Ms. Hunke is seen back today for follow up of her severe LV dysfunction She was started on valsartan ( instead of Losartan ) during her last visit. We increase her Coreg and DC'd the Amlodipine . Her BP has been better.  Breathing is better.   She had foot surgery 2 days ago.  Is getting along ok    October 30, 2014:   She is doing ok. No CP or dyspnea.   Nov. 28, 2016:  Amarrah is seen today for follow up of her Takotsubo syndrome and chronic Systolic CHF. Hx of HTN BP is up a bit today -  Ate some ham over Thanksgiving . She is short of breath at times.   Jan. 17, 2017: Has some dyspnea - particularly with exertion . Walking to the kitchen causes her to have dyspnea.  Is still eating lots of salt .   She craves salt.  Still eating lots of salty snacks.   July 07, 2015: Doing  well Staying fairly active Has some dyspnea.  Oct . 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  Jan. 23, 2018:  Ms. Marcelli is doing ok.  Her last echocardiogram 02/03/2016 shows slight improvement of her left ventricle systolic function.  Her ejection fraction is now 40-45% range.  Tolerating the meds well.    Feb. 27, 2018:  Chiyeko is seen today for Entresto follow up  Seems to be tolerating the Entresto She stopped the Spironolactone - she had orthostasis.   August 24, 2016 Ms. Fernicola is doing well.  Recent echo shows stable EF - 40-45%.  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 4, 2018:  Doing well   No CP or dyspnea discussed going up on the [place].  She does not want to change any of her medications because she is doing so well.     Past Medical History:  Diagnosis Date  . Anal fissure   . Chest pain, unspecified   . Diabetes mellitus without complication ([place])   . Glaucoma    pt. states  no-glaucoma 07/20/13  . Hypertension   . Other and unspecified hyperlipidemia   . Palpitations   . Pneumonia   . PVC (premature ventricular contraction)   . Rosacea   . Shingles    hx  . Shortness of breath   . Subclinical hypothyroidism     Past Surgical History:  Procedure Laterality Date  . APPENDECTOMY    . bilateral eye surgery/cataract repair  3/09  . BLADDER REPAIR    . BOWEL RESECTION    . LEFT HEART CATHETERIZATION WITH CORONARY ANGIOGRAM N/A 04/24/2014   Procedure: LEFT HEART CATHETERIZATION WITH CORONARY ANGIOGRAM;  Surgeon: Wellington Hampshire, MD;  Location: [facility];  Service: Cardiovascular;  Laterality: N/A;  . TOTAL ABDOMINAL HYSTERECTOMY       Current Outpatient Medications  Medication Sig Dispense Refill  . ALPRAZolam (XANAX) 0.5 MG tablet Take 0.5 mg by mouth at bedtime as needed for anxiety.    Marland Kitchen aspirin EC 81 MG EC tablet Take 1 tablet (81 mg total) by mouth daily. 30 tablet 0  .  atorvastatin (LIPITOR) 10 MG tablet Take 10 mg by mouth daily at 6 PM.     . carvedilol (COREG) 12.5 MG tablet TAKE ONE TABLET BY MOUTH TWICE DAILY WITH A MEAL 180 tablet 2  . desonide (DESOWEN) 0.05 % cream Apply 1 application topically 2 (two) times daily as needed (for itching/dry skin).    Marland Kitchen FLUZONE HIGH-DOSE 0.5 ML SUSY Inject as directed once.    . meclizine (ANTIVERT) 25 MG tablet Take 1 tablet (25 mg total) by mouth 3 (three) times daily as needed for dizziness. 30 tablet 0  . metFORMIN (GLUCOPHAGE) 1000 MG tablet Take 1 tablet (1,000 mg total) by mouth 2 (two) times daily.    . sacubitril-valsartan (ENTRESTO) 24-26 MG Take 1 tablet by mouth 2 (two) times daily. 60 tablet 11  . spironolactone (ALDACTONE) 25 MG tablet TAKE ONE-HALF TABLET BY MOUTH ONCE DAILY. 45 tablet 3  . SYNTHROID 50 MCG tablet Take 50 mcg by mouth daily.    Marland Kitchen VITAMIN A PO Take 1 tablet by mouth daily.     No current facility-administered medications for this visit.     Allergies:   Codeine; Januvia [sitagliptin]; Other; Penicillins; and Sulfonamide derivatives    Social History:  The patient  reports that  has never smoked. she has never used smokeless tobacco. She reports that she does not drink alcohol or use drugs.   Family History:  The patient's family history includes Alcoholism in her father; Alzheimer's disease in her mother; Diabetes in her daughter, maternal grandmother, and paternal grandmother; Hypertension in her mother; Other in her sister; Prostate cancer in her brother; Seizures in her sister; Thyroid disease in her brother, daughter, mother, and son.    ROS:  As per current history . Otherwise negative    Physical Exam: Blood pressure 124/62, pulse 63, height 5\' 6"  (1.676 m), weight 162 lb (73.5 kg), SpO2 96 %.  GEN:  Well nourished, well developed in no acute distress HEENT: Normal NECK: No JVD; No carotid bruits LYMPHATICS: No lymphadenopathy CARDIAC: RR, no murmurs, rubs,  gallops RESPIRATORY:  Clear to auscultation without rales, wheezing or rhonchi  ABDOMEN: Soft, non-tender, non-distended MUSCULOSKELETAL:  No edema; No deformity  SKIN: Warm and dry NEUROLOGIC:  Alert and oriented x 3   EKG:    Recent Labs: 08/17/2016: BUN 18; Creatinine, Ser 1.14; Potassium 4.3; Sodium 142    Lipid Panel    Component Value Date/Time  CHOL 231 (H) 04/23/2014 0045   TRIG 297 (H) 04/23/2014 0045   HDL 45 04/23/2014 0045   CHOLHDL 5.1 04/23/2014 0045   VLDL 59 (H) 04/23/2014 0045   LDLCALC 127 (H) 04/23/2014 0045      Wt Readings from Last 3 Encounters:  02/22/17 162 lb (73.5 kg)  08/24/16 160 lb (72.6 kg)  05/18/16 161 lb 1.9 oz (73.1 kg)      Other studies Reviewed: Additional studies/ records that were reviewed today include: . Review of the above records demonstrates:    ASSESSMENT AND PLAN:  1.  Takotsubo syndrome:    no recurrence.    2.  Chronic systolic congestive heart failure: The patient presented to the hospital  with Takotsubo  syndrome.   Her left ventricle systolic function has decreased to EF of 25-30%. EF has improved to 45%.  She is on Entresto.  She does not want to increase the dose.  We will continue to monitor.  3. Essential hypertension: Blood pressure is well controlled.  4.  Hyperlipidemia: She is on atorvastatin 10 mg a day.  Will check fasting labs today.  Current medicines are reviewed at length with the patient today.  The patient does not have concerns regarding medicines.   Labs/ tests ordered today include:  No orders of the defined types were placed in this encounter.   Disposition:   FU with [place]  In 6  months    Signed, Mertie Moores, MD  02/22/2017 8:11 AM    [facility] [place], [place], [place]  [address] Phone: [phone]; Fax: [phone]

## 2017-02-22 NOTE — Patient Instructions (Signed)

## 2017-04-17 ENCOUNTER — Other Ambulatory Visit: Payer: Self-pay | Admitting: Cardiovascular Disease

## 2017-05-15 ENCOUNTER — Other Ambulatory Visit: Payer: Self-pay | Admitting: Cardiovascular Disease

## 2017-09-20 ENCOUNTER — Ambulatory Visit: Payer: Medicare Other | Admitting: Cardiovascular Disease

## 2017-09-20 ENCOUNTER — Encounter: Payer: Self-pay | Admitting: Cardiovascular Disease

## 2017-09-20 VITALS — BP 130/72 | HR 67 | Ht 66.0 in | Wt 157.8 lb

## 2017-09-20 DIAGNOSIS — I5022 Chronic systolic (congestive) heart failure: Secondary | ICD-10-CM | POA: Diagnosis not present

## 2017-09-20 DIAGNOSIS — I10 Essential (primary) hypertension: Secondary | ICD-10-CM

## 2017-09-20 LAB — BASIC METABOLIC PANEL
BUN / CREAT RATIO: 19 (ref 12–28)
BUN: 19 mg/dL (ref 8–27)
CALCIUM: 9.9 mg/dL (ref 8.7–10.3)
CO2: 21 mmol/L (ref 20–29)
Chloride: 106 mmol/L (ref 96–106)
Creatinine, Ser: 1.02 mg/dL — ABNORMAL HIGH (ref 0.57–1.00)
GFR calc Af Amer: 59 mL/min/{1.73_m2} — ABNORMAL LOW (ref >59)
GFR calc non Af Amer: 51 mL/min/{1.73_m2} — ABNORMAL LOW (ref >59)
Glucose: 110 mg/dL — ABNORMAL HIGH (ref 65–99)
Potassium: 4.5 mmol/L (ref 3.5–5.2)
Sodium: 142 mmol/L (ref 134–144)

## 2017-09-20 MED ORDER — SACUBITRIL-VALSARTAN 49-51 MG PO TABS: 1.0000 | ORAL_TABLET | Freq: Two times a day (BID) | ORAL | 11 refills | Status: DC

## 2017-09-20 NOTE — Patient Instructions (Addendum)
Medication Instructions:  Your physician has recommended you make the following change in your medication:   INCREASE Entresto to 49-51 mg twice daily   Labwork: TODAY - basic metabolic panel  Your physician recommends that you return for lab work on Tuesday July 30   Testing/Procedures: None Ordered   Follow-Up: Your physician recommends that you return for a follow-up appointment on Tuesday July 30 at 11:30 am for BP check and lab appointment  Your physician recommends that you schedule a follow-up appointment in: 3-4 months with Dr. Acie Fredrickson   If you need a refill on your cardiac medications before your next appointment, please call your pharmacy.   Thank you for choosing [facility]! Christen Bame, RN [phone]

## 2017-09-20 NOTE — Progress Notes (Signed)
Cardiology Office Note   Date:  09/20/2017   ID:  [name], [name] 1932/11/21, MRN [id number]  PCP:  Marton Redwood, MD  Cardiologist:   Mertie Moores, MD   Chief Complaint  Patient presents with  . Congestive Heart Failure   1. Takotsubo Syndrome-  2.actue on chronic systolic CHF - EF 70-35% - , now 40-45  3. Hypertension- 4. Diabetes mellitus 5. LBBB    Kayla Medina is a 84 y.o. female who presents for f/u of her mild MI - Takotsubo syndrome. Feels weak, no CP, Breathing is ok. Has some DOE .   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     Kayla Medina is a 84 y.o. female who presents for follow up of her Takotsubo.  BP at home is ok.  A bit elevated here. 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   September 10, 2014: Kayla Medina EF has fallen ( unexpectedly) .  She was diagnosed with Takotsubo  syndrome and has been on a beta blocker and angiotensin blocker since that time. Her ejection fraction has fallen to 25-30%.  She was recently seen by Dr. Brigitte Pulse and had some hypertension. Amlodipine was added to her medical regimen at that time.  September 25, 2014:  Kayla Medina is seen back today for follow up of her severe LV dysfunction She was started on valsartan ( instead of Losartan ) during her last visit. We increase her Coreg and DC'd the Amlodipine . Her BP has been better.  Breathing is better.   She had foot surgery 2 days ago.  Is getting along ok    October 30, 2014:   She is doing ok. No CP or dyspnea.   Nov. 28, 2016:  Kayla Medina is seen today for follow up of her Takotsubo syndrome and chronic Systolic CHF. Hx of HTN BP is up a bit today -  Ate some ham over Thanksgiving . She is short of breath at times.   Kayla Medina, Kayla Medina: Has some dyspnea - particularly with exertion . Walking to the kitchen causes her to have dyspnea.  Is still eating lots of salt .   She craves salt.  Still eating lots of salty snacks.   [name], Kayla Medina: Doing well Staying  fairly active Has some dyspnea.  Oct . 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  Jan. 23, 2018:  Kayla Medina is doing ok.  Her last echocardiogram 11/14/Kayla Medina shows slight improvement of her left ventricle systolic function.  Her ejection fraction is now 40-45% range.  Tolerating the meds well.    Feb. 27, 2018:  Kayla Medina is seen today for Entresto follow up  Seems to be tolerating the Entresto She stopped the Spironolactone - she had orthostasis.   August 24, 2016 Kayla Medina is doing well.  Recent echo shows stable EF - 40-45%.  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 4, 2018:  Doing well   No CP or dyspnea discussed going up on the [place].  She does not want to change any of her medications because she is doing so well.  September 20, 2017:   Kayla Medina  is seen today for follow-up of her congestive heart failure.  She is doing well on the current dose of Entresto. Still working - Systems analyst     Past Medical  History:  Diagnosis Date  . Anal fissure   . Chest pain, unspecified   . Diabetes mellitus without complication ([place])   . Glaucoma    pt. states no-glaucoma 07/20/13  . Hypertension   . Other and unspecified hyperlipidemia   . Palpitations   . Pneumonia   . PVC (premature ventricular contraction)   . Rosacea   . Shingles    hx  . Shortness of breath   . Subclinical hypothyroidism     Past Surgical History:  Procedure Laterality Date  . APPENDECTOMY    . bilateral eye surgery/cataract repair  3/09  . BLADDER REPAIR    . BOWEL RESECTION    . LEFT HEART CATHETERIZATION WITH CORONARY ANGIOGRAM N/A 04/24/2014   Procedure: LEFT HEART CATHETERIZATION WITH CORONARY ANGIOGRAM;  Surgeon: Wellington Hampshire, MD;  Location: [facility];  Service: Cardiovascular;  Laterality: N/A;  . TOTAL ABDOMINAL HYSTERECTOMY       Current Outpatient Medications  Medication Sig Dispense Refill  . ALPRAZolam (XANAX) 0.5  MG tablet Take 0.5 mg by mouth at bedtime as needed for anxiety.    Marland Kitchen aspirin EC 81 MG EC tablet Take 1 tablet (81 mg total) by mouth daily. 30 tablet 0  . atorvastatin (LIPITOR) 10 MG tablet Take 10 mg by mouth daily at 6 PM.     . carvedilol (COREG) 12.5 MG tablet TAKE 1 TABLET BY MOUTH TWICE DAILY WITH A MEAL 180 tablet 2  . desonide (DESOWEN) 0.05 % cream Apply 1 application topically 2 (two) times daily as needed (for itching/dry skin).    Marland Kitchen FLUZONE HIGH-DOSE 0.5 ML SUSY Inject as directed once.    . meclizine (ANTIVERT) 25 MG tablet Take 1 tablet (25 mg total) by mouth 3 (three) times daily as needed for dizziness. 30 tablet 0  . metFORMIN (GLUCOPHAGE) 1000 MG tablet Take 1 tablet (1,000 mg total) by mouth 2 (two) times daily.    Marland Kitchen spironolactone (ALDACTONE) 25 MG tablet TAKE ONE-HALF TABLET BY MOUTH ONCE DAILY. 45 tablet 3  . SYNTHROID 50 MCG tablet Take 50 mcg by mouth daily.    Marland Kitchen VITAMIN A PO Take 1 tablet by mouth daily.    . sacubitril-valsartan (ENTRESTO) 49-51 MG Take 1 tablet by mouth 2 (two) times daily. 60 tablet 11   No current facility-administered medications for this visit.     Allergies:   Codeine; Januvia [sitagliptin]; Other; Penicillins; and Sulfonamide derivatives    Social History:  The patient  reports that she has never smoked. She has never used smokeless tobacco. She reports that she does not drink alcohol or use drugs.   Family History:  The patient's family history includes Alcoholism in her father; Alzheimer's disease in her mother; Diabetes in her daughter, maternal grandmother, and paternal grandmother; Hypertension in her mother; Other in her sister; Prostate cancer in her brother; Seizures in her sister; Thyroid disease in her brother, daughter, mother, and son.    ROS:  As per current history .  Physical Exam: Blood pressure 130/72, pulse 67, height 5\' 6"  (1.676 m), weight 157 lb 12.8 oz (71.6 kg).  GEN:   Elderly female,  NAD  HEENT: Normal NECK:  No JVD; No carotid bruits LYMPHATICS: No lymphadenopathy CARDIAC: RR  RESPIRATORY:  Clear to auscultation without rales, wheezing or rhonchi  ABDOMEN: Soft, non-tender, non-distended MUSCULOSKELETAL:  No edema; No deformity  SKIN: Warm and dry NEUROLOGIC:  Alert and oriented x 3   EKG:  September 20, 2017:  NSR, LBBB with HR of 67.     Recent Labs: 02/22/2017: ALT 9; BUN Medina; Creatinine, Ser 0.96; Potassium 4.2; Sodium 141    Lipid Panel    Component Value Date/Time   CHOL 147 02/22/2017 0827   TRIG 165 (H) 02/22/2017 0827   HDL 55 02/22/2017 0827   CHOLHDL 2.7 02/22/2017 0827   CHOLHDL 5.1 04/23/2014 0045   VLDL 59 (H) 04/23/2014 0045   LDLCALC 59 02/22/2017 0827      Wt Readings from Last 3 Encounters:  09/20/17 157 lb 12.8 oz (71.6 kg)  02/22/17 162 lb (73.5 kg)  08/24/16 160 lb (72.6 kg)      Other studies Reviewed: Additional studies/ records that were reviewed today include: . Review of the above records demonstrates:    ASSESSMENT AND PLAN:  1.  Takotsubo syndrome:    no recurrence.    2.  Chronic systolic congestive heart failure: The patient presented to the hospital  with Takotsubo  syndrome.   Her left ventricle systolic function has decreased to EF of 25-30%. Her ejection fraction has gradually improved.  We will increase the Entresto to 49-51 mg twice a day.  We will have her return for a nurse visit in 3 to [redacted] weeks along with a basic metabolic profile. See her in 3 to 4 months for follow-up office visit.  .  3. Essential hypertension:    Blood pressure is well controlled.  4.  Hyperlipidemia:    Stable.  Current medicines are reviewed at length with the patient today.  The patient does not have concerns regarding medicines.   Labs/ tests ordered today include:   Orders Placed This Encounter  Procedures  . Basic Metabolic Panel (BMET)  . Basic Metabolic Panel (BMET)  . EKG 12-Lead    Disposition:     Signed, Mertie Moores, MD  09/20/2017 10:54  AM    [facility] [place], [place], [place]  [address] Phone: [phone]; Fax: [phone]

## 2017-10-18 ENCOUNTER — Ambulatory Visit: Payer: Medicare Other

## 2017-10-18 ENCOUNTER — Other Ambulatory Visit: Payer: Medicare Other

## 2017-11-09 ENCOUNTER — Ambulatory Visit (INDEPENDENT_AMBULATORY_CARE_PROVIDER_SITE_OTHER): Payer: Medicare Other | Admitting: Nurse Practitioner

## 2017-11-09 ENCOUNTER — Other Ambulatory Visit: Payer: Medicare Other | Admitting: *Deleted

## 2017-11-09 VITALS — BP 128/64 | HR 64 | Ht 66.0 in | Wt 157.0 lb

## 2017-11-09 DIAGNOSIS — I10 Essential (primary) hypertension: Secondary | ICD-10-CM | POA: Diagnosis not present

## 2017-11-09 DIAGNOSIS — I5022 Chronic systolic (congestive) heart failure: Secondary | ICD-10-CM | POA: Diagnosis not present

## 2017-11-09 LAB — BASIC METABOLIC PANEL
BUN / CREAT RATIO: 23 (ref 12–28)
BUN: 25 mg/dL (ref 8–27)
CO2: 18 mmol/L — ABNORMAL LOW (ref 20–29)
Calcium: 9.9 mg/dL (ref 8.7–10.3)
Chloride: 99 mmol/L (ref 96–106)
Creatinine, Ser: 1.09 mg/dL — ABNORMAL HIGH (ref 0.57–1.00)
GFR calc non Af Amer: 47 mL/min/{1.73_m2} — ABNORMAL LOW (ref >59)
GFR, EST AFRICAN AMERICAN: 54 mL/min/{1.73_m2} — AB (ref >59)
GLUCOSE: 296 mg/dL — AB (ref 65–99)
POTASSIUM: 4.6 mmol/L (ref 3.5–5.2)
SODIUM: 135 mmol/L (ref 134–144)

## 2017-11-09 NOTE — Patient Instructions (Signed)
Medication Instructions:  Your physician recommends that you continue on your current medications as directed. Please refer to the Current Medication list given to you today.   Labwork: BMET done today Sharyn Lull will call you with your results   Testing/Procedures: None Ordered   Follow-Up: Your physician recommends that you keep your follow-up appointment on October 22 at 0800 with Dr. Acie Fredrickson   If you need a refill on your cardiac medications before your next appointment, please call your pharmacy.   Thank you for choosing [facility]! Christen Bame, RN [phone]

## 2017-11-09 NOTE — Progress Notes (Signed)
1.) Reason for visit: BP check and BMET   2.) Name of MD requesting visit: Dr. Acie Fredrickson  3.) H&P: Patient presents for BP check and BMET for increase of Entresto to 49-51 mg BID on September 20, 2017. Patient was scheduled to return on July 30 but had URI and cancelled her appointment  4.) ROS related to problem: patient denies c/o SOB, dizziness, light-headedness or hypotension  She states she is feeling well other than the respiratory  infection and a problem with bursitis    5.) Assessment and plan per MD: Will call patient's pharmacy to clarify dose of Spironolactone. Patient thinks she may be taking 25 mg BID. I advised her that I will call her with lab results and to clarify dose of Spironolactone. Patient is aware of follow-up on 10/22 with Dr. Georgetta Haber pharmacy who state patient has Spironolactone 25 mg with instructions to take 1/2 tablet daily I called patient and advised her to take Spironolactone 12.5 mg daily. I advised her to check her pill bottle when she returns home and to call back with questions or concerns. She verbalized understanding and agreement with plan and thanked me for the call.

## 2017-12-29 ENCOUNTER — Ambulatory Visit: Payer: Medicare Other | Admitting: Podiatry

## 2018-01-10 ENCOUNTER — Ambulatory Visit: Payer: Medicare Other | Admitting: Cardiovascular Disease

## 2018-01-11 ENCOUNTER — Ambulatory Visit: Payer: Medicare Other | Admitting: Podiatry

## 2018-01-18 ENCOUNTER — Ambulatory Visit: Payer: Medicare Other | Admitting: Podiatry

## 2018-01-18 ENCOUNTER — Encounter: Payer: Self-pay | Admitting: Podiatry

## 2018-01-18 ENCOUNTER — Ambulatory Visit (INDEPENDENT_AMBULATORY_CARE_PROVIDER_SITE_OTHER): Payer: Medicare Other

## 2018-01-18 DIAGNOSIS — M722 Plantar fascial fibromatosis: Secondary | ICD-10-CM | POA: Diagnosis not present

## 2018-01-18 NOTE — Patient Instructions (Signed)
Pre-Operative Instructions  Congratulations, you have decided to take an important step towards improving your quality of life.  You can be assured that the doctors and staff at [facility] will be with you every step of the way.  Here are some important things you should know:  1. Plan to be at the surgery center/hospital at least 1 (one) hour prior to your scheduled time, unless otherwise directed by the surgical center/hospital staff.  You must have a responsible adult accompany you, remain during the surgery and drive you home.  Make sure you have directions to the surgical center/hospital to ensure you arrive on time. 2. If you are having surgery at [facility] or [facility], you will need a copy of your medical history and physical form from your family physician within one month prior to the date of surgery. We will give you a form for your primary physician to complete.  3. We make every effort to accommodate the date you request for surgery.  However, there are times where surgery dates or times have to be moved.  We will contact you as soon as possible if a change in schedule is required.   4. No aspirin/ibuprofen for one week before surgery.  If you are on aspirin, any non-steroidal anti-inflammatory medications (Mobic, Aleve, Ibuprofen) should not be taken seven (7) days prior to your surgery.  You make take Tylenol for pain prior to surgery.  5. Medications - If you are taking daily heart and blood pressure medications, seizure, reflux, allergy, asthma, anxiety, pain or diabetes medications, make sure you notify the surgery center/hospital before the day of surgery so they can tell you which medications you should take or avoid the day of surgery. 6. No food or drink after midnight the night before surgery unless directed otherwise by surgical center/hospital staff. 7. No alcoholic beverages 24-hours prior to surgery.  No smoking 24-hours prior or 24-hours after  surgery. 8. Wear loose pants or shorts. They should be loose enough to fit over bandages, boots, and casts. 9. Don't wear slip-on shoes. Sneakers are preferred. 10. Bring your boot with you to the surgery center/hospital.  Also bring crutches or a walker if your physician has prescribed it for you.  If you do not have this equipment, it will be provided for you after surgery. 11. If you have not been contacted by the surgery center/hospital by the day before your surgery, call to confirm the date and time of your surgery. 12. Leave-time from work may vary depending on the type of surgery you have.  Appropriate arrangements should be made prior to surgery with your employer. 13. Prescriptions will be provided immediately following surgery by your doctor.  Fill these as soon as possible after surgery and take the medication as directed. Pain medications will not be refilled on weekends and must be approved by the doctor. 14. Remove nail polish on the operative foot and avoid getting pedicures prior to surgery. 15. Wash the night before surgery.  The night before surgery wash the foot and leg well with water and the antibacterial soap provided. Be sure to pay special attention to beneath the toenails and in between the toes.  Wash for at least three (3) minutes. Rinse thoroughly with water and dry well with a towel.  Perform this wash unless told not to do so by your physician.  Enclosed: 1 Ice pack (please put in freezer the night before surgery)   1 Hibiclens skin cleaner     Pre-op instructions  If you have any questions regarding the instructions, please do not hesitate to call our office.  [place]: [address], [place], [place] [address] -- [phone]  [place]: [address] [place], [place] [address] -- [phone]  [place]: [address]  [place], [place] [address] -- [phone]  [place]: [address], [address], [place], [place] [address] -- [phone]  Website:  [email or web address] 

## 2018-01-18 NOTE — Progress Notes (Signed)
Subjective:   Patient ID: Kayla Medina, female   DOB: 84 y.o.   MRN: [id number]   HPI Patient presents stating my heel is killing me and my left one did exactly the same thing and I needed surgery on it and injections of never help me and I want it fixed.  Patient states she can not walk well with the heel and it reminds her exactly her with her foot done and she did great with surgery and wants it on this foot.  Patient does not smoke likes to be active and does have diabetes under excellent control with A1c under 6.0   Review of Systems  All other systems reviewed and are negative.       Objective:  Physical Exam  Constitutional: She appears well-developed and well-nourished.  Cardiovascular: Intact distal pulses.  Pulmonary/Chest: Effort normal.  Musculoskeletal: Normal range of motion.  Neurological: She is alert.  Skin: Skin is warm.  Nursing note and vitals reviewed.   Neurovascular status intact muscle strength is adequate range of motion within normal limits with patient found to have exquisite discomfort plantar aspect right heel at the insertional point of the tendon calcaneus with inflammation fluid around the medial band.  Small incision site left that healed very well from previous surgery that we did due to chronic nature of condition in the left heel.     Assessment:  Acute plantar fasciitis right intense and its discomfort at the insertion calcaneus with patient who had surgery on the left     Plan:  H&P condition reviewed and due to the chronic nature of her condition I have recommended correction.  Patient wants this done understanding all risks and wants to go over consent form today and get it done as soon as possible.  I allowed her to go over consent form reviewing alternative treatments complications and the fact there is [place] no long-term guarantees this will solve her problem and she is willing to accept risk and signed consent form refusing any other  treatments for this condition.  I dispensed air fracture walker with all instructions on usage and encouraged her to call us with any questions concerns she may have prior to surgery  X-ray indicates small spur formation with no indication stress fracture or advanced arthritis

## 2018-01-19 ENCOUNTER — Telehealth: Payer: Self-pay | Admitting: *Deleted

## 2018-01-19 NOTE — Telephone Encounter (Signed)
"  I need to switch my surgery from November 5 to November 12 because I can't find anybody to switch with me at work."  I'll get it rescheduled.  "Thank you very much."

## 2018-01-25 ENCOUNTER — Ambulatory Visit: Payer: Medicare Other | Admitting: Cardiovascular Disease

## 2018-01-31 ENCOUNTER — Encounter: Payer: Self-pay | Admitting: Podiatry

## 2018-01-31 DIAGNOSIS — M722 Plantar fascial fibromatosis: Secondary | ICD-10-CM

## 2018-02-06 ENCOUNTER — Other Ambulatory Visit: Payer: Self-pay

## 2018-02-06 ENCOUNTER — Encounter (HOSPITAL_COMMUNITY): Payer: Self-pay | Admitting: *Deleted

## 2018-02-06 ENCOUNTER — Emergency Department (HOSPITAL_COMMUNITY)
Admission: EM | Admit: 2018-02-06 | Discharge: 2018-02-06 | Disposition: A | Discharge status: Home / Self Care | Payer: Medicare Other | Attending: Emergency Medicine | Admitting: Emergency Medicine

## 2018-02-06 ENCOUNTER — Emergency Department (HOSPITAL_COMMUNITY): Payer: Medicare Other

## 2018-02-06 ENCOUNTER — Ambulatory Visit (INDEPENDENT_AMBULATORY_CARE_PROVIDER_SITE_OTHER): Payer: Self-pay | Admitting: Podiatry

## 2018-02-06 DIAGNOSIS — I110 Hypertensive heart disease with heart failure: Secondary | ICD-10-CM | POA: Insufficient documentation

## 2018-02-06 DIAGNOSIS — Z79899 Other long term (current) drug therapy: Secondary | ICD-10-CM | POA: Diagnosis not present

## 2018-02-06 DIAGNOSIS — I5022 Chronic systolic (congestive) heart failure: Secondary | ICD-10-CM | POA: Insufficient documentation

## 2018-02-06 DIAGNOSIS — R079 Chest pain, unspecified: Secondary | ICD-10-CM | POA: Diagnosis present

## 2018-02-06 DIAGNOSIS — Z7984 Long term (current) use of oral hypoglycemic drugs: Secondary | ICD-10-CM | POA: Insufficient documentation

## 2018-02-06 DIAGNOSIS — Z7982 Long term (current) use of aspirin: Secondary | ICD-10-CM | POA: Insufficient documentation

## 2018-02-06 DIAGNOSIS — M722 Plantar fascial fibromatosis: Secondary | ICD-10-CM

## 2018-02-06 DIAGNOSIS — R0789 Other chest pain: Secondary | ICD-10-CM | POA: Insufficient documentation

## 2018-02-06 DIAGNOSIS — E119 Type 2 diabetes mellitus without complications: Secondary | ICD-10-CM | POA: Diagnosis not present

## 2018-02-06 LAB — I-STAT TROPONIN, ED
TROPONIN I, POC: 0 ng/mL (ref 0.00–0.08)
Troponin i, poc: 0 ng/mL (ref 0.00–0.08)

## 2018-02-06 LAB — BASIC METABOLIC PANEL
ANION GAP: 8 (ref 5–15)
BUN: 17 mg/dL (ref 8–23)
CALCIUM: 9.4 mg/dL (ref 8.9–10.3)
CHLORIDE: 107 mmol/L (ref 98–111)
CO2: 24 mmol/L (ref 22–32)
Creatinine, Ser: 1.13 mg/dL — ABNORMAL HIGH (ref 0.44–1.00)
GFR calc non Af Amer: 44 mL/min — ABNORMAL LOW (ref >60)
GFR, EST AFRICAN AMERICAN: 51 mL/min — AB (ref >60)
Glucose, Bld: 231 mg/dL — ABNORMAL HIGH (ref 70–99)
Potassium: 3.7 mmol/L (ref 3.5–5.1)
Sodium: 139 mmol/L (ref 135–145)

## 2018-02-06 LAB — CBC
HCT: 38.8 % (ref 36.0–46.0)
Hemoglobin: 12.3 g/dL (ref 12.0–15.0)
MCH: 29.8 pg (ref 26.0–34.0)
MCHC: 31.7 g/dL (ref 30.0–36.0)
MCV: 93.9 fL (ref 80.0–100.0)
NRBC: 0 % (ref 0.0–0.2)
Platelets: 317 10*3/uL (ref 150–400)
RBC: 4.13 MIL/uL (ref 3.87–5.11)
RDW: 13.2 % (ref 11.5–15.5)
WBC: 8.8 10*3/uL (ref 4.0–10.5)

## 2018-02-06 LAB — BRAIN NATRIURETIC PEPTIDE: B NATRIURETIC PEPTIDE 5: 130.7 pg/mL — AB (ref 0.0–100.0)

## 2018-02-06 MED ORDER — KETOROLAC TROMETHAMINE 15 MG/ML IJ SOLN
15.0000 mg | Freq: Once | INTRAMUSCULAR | Status: DC
  Filled 2018-02-06: qty 1

## 2018-02-06 MED ORDER — LIDOCAINE 5 % EX PTCH
1.0000 | MEDICATED_PATCH | CUTANEOUS | Status: DC
  Filled 2018-02-06: qty 1

## 2018-02-06 MED ORDER — METHOCARBAMOL 500 MG PO TABS
500.0000 mg | ORAL_TABLET | Freq: Once | ORAL | Status: DC
  Filled 2018-02-06: qty 1

## 2018-02-06 NOTE — ED Provider Notes (Signed)
[facility] EMERGENCY DEPARTMENT Provider Note   CSN: [id number] Arrival date & time: 02/06/18  1447     History   Chief Complaint Chief Complaint  Patient presents with  . Chest Pain    HPI Kayla Medina is a 84 y.o. female.  The history is provided by the patient and a relative.  Chest Pain   This is a new problem. The current episode started 6 to 12 hours ago. The pain is present in the lateral region. The pain is severe. The quality of the pain is described as pressure-like. The pain does not radiate. Associated symptoms include cough. Pertinent negatives include no abdominal pain, no back pain, no claudication, no diaphoresis, no dizziness, no exertional chest pressure, no fever, no headaches, no hemoptysis, no irregular heartbeat, no leg pain, no lower extremity edema, no nausea, no near-syncope, no numbness, no orthopnea, no palpitations, no shortness of breath, no sputum production, no vomiting and no weakness. She has tried nothing for the symptoms. Risk factors include being elderly.  Her past medical history is significant for CHF, diabetes and hyperlipidemia.  Procedure history is positive for echocardiogram.    Past Medical History:  Diagnosis Date  . Anal fissure   . Chest pain, unspecified   . Diabetes mellitus without complication ([place])   . Glaucoma    pt. states no-glaucoma 07/20/13  . Hypertension   . Other and unspecified hyperlipidemia   . Palpitations   . Pneumonia   . PVC (premature ventricular contraction)   . Rosacea   . Shingles    hx  . Shortness of breath   . Subclinical hypothyroidism     Patient Active Problem List   Diagnosis Date Noted  . Chronic systolic CHF (congestive heart failure) ([place]) 09/10/2014  . HTN (hypertension) 06/12/2014  . Takotsubo syndrome 05/10/2014  . Chest pain 04/23/2014  . Dyspnea 04/23/2014  . Obstructive thrombus 04/23/2014  . Orthostasis 06/01/2013  . Ataxia 06/01/2013  . DM 04/17/2009  .  Hyperlipidemia 04/17/2009  . CHEST PAIN-UNSPECIFIED 04/17/2009  . PALPITATIONS 04/14/2009  . DYSPNEA 04/14/2009    Past Surgical History:  Procedure Laterality Date  . APPENDECTOMY    . bilateral eye surgery/cataract repair  3/09  . BLADDER REPAIR    . BOWEL RESECTION    . LEFT HEART CATHETERIZATION WITH CORONARY ANGIOGRAM N/A 04/24/2014   Procedure: LEFT HEART CATHETERIZATION WITH CORONARY ANGIOGRAM;  Surgeon: Wellington Hampshire, MD;  Location: [facility] CATH LAB;  Service: Cardiovascular;  Laterality: N/A;  . TOTAL ABDOMINAL HYSTERECTOMY       OB History   None      Home Medications    Prior to Admission medications   Medication Sig Start Date End Date Taking? Authorizing Provider  acetaminophen (TYLENOL) 650 MG CR tablet Take 650-1,300 mg by mouth 2 (two) times daily as needed for pain.   Yes [provider]  ALPRAZolam Duanne Moron) 0.5 MG tablet Take 0.5 mg by mouth at bedtime.    Yes [provider]  aspirin EC 81 MG EC tablet Take 1 tablet (81 mg total) by mouth daily. 04/25/14  Yes Elgergawy, Silver Huguenin, MD  atorvastatin (LIPITOR) 10 MG tablet Take 10 mg by mouth daily at 6 PM.    Yes [provider]  carvedilol (COREG) 12.5 MG tablet TAKE 1 TABLET BY MOUTH TWICE DAILY WITH A MEAL Patient taking differently: Take 6.25 mg by mouth 2 (two) times daily with a meal.  05/17/17  Yes Nahser, Arnette Norris  J, MD  desonide (DESOWEN) 0.05 % cream Apply 1 application topically 2 (two) times daily as needed (for itching/dry skin).   Yes [provider]  meclizine (ANTIVERT) 25 MG tablet Take 1 tablet (25 mg total) by mouth 3 (three) times daily as needed for dizziness. 06/03/13  Yes Shon Baton, MD  metFORMIN (GLUCOPHAGE) 1000 MG tablet Take 1 tablet (1,000 mg total) by mouth 2 (two) times daily. 04/26/14  Yes Elgergawy, Silver Huguenin, MD  metroNIDAZOLE (METROCREAM) 0.75 % cream Apply 1 application topically daily as needed (to all itchy sites- as directed).  01/11/18  Yes [provider]  sacubitril-valsartan (ENTRESTO) 49-51 MG Take 1 tablet by mouth 2 (two) times daily. 09/20/17  Yes Nahser, Wonda Cheng, MD  SYNTHROID 50 MCG tablet Take 50 mcg by mouth daily. 05/12/16  Yes [provider]  ONETOUCH VERIO test strip  01/08/18   [provider]  spironolactone (ALDACTONE) 25 MG tablet TAKE ONE-HALF TABLET BY MOUTH ONCE DAILY. Patient not taking: Reported on 02/06/2018 02/18/16   Nahser, Wonda Cheng, MD    Family History Family History  Problem Relation Age of Onset  . Alzheimer's disease Mother   . Hypertension Mother   . Thyroid disease Mother   . Alcoholism Father   . Diabetes Paternal Grandmother        entire family  . Diabetes Maternal Grandmother   . Prostate cancer Brother   . Seizures Sister   . Diabetes Daughter        x 2  . Thyroid disease Brother        x2   . Thyroid disease Daughter   . Thyroid disease Son   . Other Sister        meningitis    Social History Social History   Tobacco Use  . Smoking status: Never Smoker  . Smokeless tobacco: Never Used  Substance Use Topics  . Alcohol use: No  . Drug use: No     Allergies   Codeine; Januvia [sitagliptin]; Other; Penicillins; and Sulfonamide derivatives   Review of Systems Review of Systems  Constitutional: Positive for activity change. Negative for appetite change, diaphoresis, fatigue and fever.  Respiratory: Positive for cough and chest tightness. Negative for hemoptysis, sputum production and shortness of breath.   Cardiovascular: Positive for chest pain. Negative for palpitations, orthopnea, claudication and near-syncope.  Gastrointestinal: Negative for abdominal distention, abdominal pain, constipation, diarrhea, nausea and vomiting.  Musculoskeletal: Negative for back pain.  Skin: Negative for rash and wound.  Neurological: Negative for dizziness, weakness, numbness and headaches.     Physical Exam Updated Vital Signs BP (!) 168/65   Pulse 66    Temp (!) 97.5 F (36.4 C) (Oral)   Resp 16   SpO2 99%   Physical Exam  Constitutional: She appears well-developed and well-nourished. No distress.  HENT:  Head: Normocephalic and atraumatic.  Eyes: Conjunctivae are normal.  Neck: Neck supple.  Cardiovascular: Normal rate and regular rhythm.  No murmur heard. Pulmonary/Chest: Effort normal and breath sounds normal. No respiratory distress. She has no decreased breath sounds. She has no wheezes.  Abdominal: Soft. There is no tenderness.  Musculoskeletal: She exhibits no edema.  Neurological: She is alert.  Skin: Skin is warm and dry.  Psychiatric: She has a normal mood and affect.  Nursing note and vitals reviewed.  ED Treatments / Results  Labs (all labs ordered are listed, but only abnormal results are displayed) Labs Reviewed  BASIC METABOLIC PANEL  CBC  BRAIN NATRIURETIC PEPTIDE  I-STAT TROPONIN, ED    EKG EKG Interpretation  Date/Time:  Monday February 06 2018 14:53:55 EST Ventricular Rate:  75 PR Interval:  174 QRS Duration: 136 QT Interval:  436 QTC Calculation: 486 R Axis:   61 Text Interpretation:  Normal sinus rhythm with sinus arrhythmia Non-specific intra-ventricular conduction block Cannot rule out Anterior infarct , age undetermined T wave abnormality, consider lateral ischemia Abnormal ECG likely repol abnormalities 2/2 lbbb similar to previous Confirmed by Merrily Pew [phone]) on 02/06/2018 4:38:54 PM   Radiology Dg Chest 2 View  Result Date: 02/06/2018 CLINICAL DATA:  Mild chest pain, worsening EXAM: CHEST - 2 VIEW COMPARISON:  06/02/2015 chest radiograph FINDINGS: Atherosclerotic calcification of the aortic arch. Cardiac and mediastinal margins appear normal. The lungs appear clear. Faint calcification along the right infraspinatus tendon. IMPRESSION: 1.  No active cardiopulmonary disease is radiographically apparent. 2.  Aortic Atherosclerosis (ICD10-I70.0). Electronically Signed   By: Van Clines M.D.   On: 02/06/2018 15:38    Procedures Procedures (including critical care time)  Medications Ordered in ED Medications - No data to display   Initial Impression / Assessment and Plan / ED Course  I have reviewed the triage vital signs and the nursing notes.  Pertinent labs & imaging results that were available during my care of the patient were reviewed by me and considered in my medical decision making (see chart for details).    Patient is a 84 y.o. female with PMHx of CHF presenting for chest pain, located on L side of chest, non-radiating, reproducible on palpation with no other associated symptoms. Concern for musculoskeletal. Unlikely to be ACS, EKG was reviewed by myself and my attending physician - normal sinus rhythm, known bundle branch block and known ST segment elevations in lateral leads, unchanged from previous imaging. Patient had clean coronary catherization - Feb 21, 2015.  Initial troponin negative. CBC unremarkable. BMP shows mild elevation in creatinine, but not enough for AKI. Patient states that she has been urinating well and no difficulty with PO intake. Upon reassessment, patient states that pain is completely gone and she refused PO/IV/topical pain medications. Unlikely to be CHF exacerbation, patient not requiring supplemental O2, no pitting edema on exam, BNP elevated but at baseline, patinet has been taking medications as prescribed.  Unlikely to be aortic dissection, patient does not describe pain radiating to back, no tearing sensation or pulse deficits.  Unlikely to PNA/pneumothorax - CXR unremarkable, patient has no elevated in WBC, no fever or chills, cough.  Second troponin negative. Patient was assessed and did not describe new pain.  Patient safe for discharge, strict return precautions. Patient counseled to follow up with PCP for further evaluation and management.   Final Clinical Impressions(s) / ED Diagnoses   Final diagnoses:  None      ED Discharge Orders    None       Erskine Squibb, MD 02/06/18 1946    Mesner, Corene Cornea, MD 02/07/18 [phone]

## 2018-02-06 NOTE — ED Triage Notes (Signed)
Pt reports having mid chest discomfort since this am. Pt thought it was gas pain or pulled muscle. No relief with asa, flexeril or gas x pta. Has sob with exertion, no swelling. Hx of chf.

## 2018-02-06 NOTE — ED Provider Notes (Signed)
I saw and evaluated the patient, reviewed the resident's note and I agree with the findings and plan with the following exceptions.   This is an 84 year old female with multiple past medical problems presents the emergency department today with left-sided chest pain.  Patient states that it feels like a muscle spasm exceptor that it comes and goes and usually her muscle spasms are more persistent.  She has been pushing around somebody in a wheelchair and thinks it might be related to this.  She has not had any associated shortness of breath, dizziness, nausea or vomiting.  No recent fevers or coughs.  It hurts in her left mid costochondral area.  No rashes. Exam she is clear lungs, normal vital signs, normal heart rate and rhythm.  When I push in the area of her discomfort she states that it feels the same but significantly worse.  She does not hurt with palpation anywhere else. Review of her records patient has no history of coronary artery disease aside from when she had takutsobo and end STEMI.  This was in 2016 and she had a catheterization report in the system and states that she had clean coronary arteries with no obvious obstructive disease.  Patient is never had any other heart issues that she knows of and seems to have recovered most of her ejection fraction. This sounds very musculoskeletal in nature.  Especially with a clean cath a few years ago I feel like delta troponins is more than enough to rule out cardiac disease with high sensitivity.  It is more muscular in nature and will treat appropriately.   EKG Interpretation  Date/Time:  Monday February 06 2018 14:53:55 EST Ventricular Rate:  75 PR Interval:  174 QRS Duration: 136 QT Interval:  436 QTC Calculation: 486 R Axis:   61 Text Interpretation:  Normal sinus rhythm with sinus arrhythmia Non-specific intra-ventricular conduction block Cannot rule out Anterior infarct , age undetermined T wave abnormality, consider lateral ischemia  Abnormal ECG likely repol abnormalities 2/2 lbbb similar to previous Confirmed by Merrily Pew [phone]) on 02/06/2018 4:38:54 PM         Daci Stubbe, Corene Cornea, MD 02/07/18 0025

## 2018-02-06 NOTE — ED Notes (Signed)
Pt states she fells better and does not wish to take any medication.

## 2018-02-06 NOTE — ED Notes (Signed)
Patient verbalizes understanding of discharge instructions. Opportunity for questioning and answers were provided. Armband removed by staff, pt discharged from ED, ambulatory with family to lobby.

## 2018-02-07 NOTE — Progress Notes (Signed)
Subjective:   Patient ID: Kayla Medina, female   DOB: 84 y.o.   MRN: [id number]   HPI Patient states I am doing great with my surgery and I am wearing a shoe and not my boot against her orders   ROS      Objective:  Physical Exam  Neurovascular status intact negative Homans sign noted with wound edges well coapted right heel medial lateral side with patient wearing shoe and not wearing the dressing as we had instructed     Assessment:  Noncompliant patient who so far appears to be doing well post endoscopic surgery     Plan:  I discussed the importance of immobilization stretching and compression.  Patient denies wanting to do this is not listening to anything I say and we will see him back 2 weeks for stitch removal or earlier if any issues were to occur

## 2018-02-22 ENCOUNTER — Ambulatory Visit (INDEPENDENT_AMBULATORY_CARE_PROVIDER_SITE_OTHER): Payer: Self-pay | Admitting: Podiatry

## 2018-02-22 DIAGNOSIS — M722 Plantar fascial fibromatosis: Secondary | ICD-10-CM

## 2018-02-22 NOTE — Progress Notes (Signed)
Subjective:   Patient ID: Kayla Medina, female   DOB: 84 y.o.   MRN: [id number]   HPI Patient states overall doing real well with minimal discomfort right   ROS      Objective:  Physical Exam  Neurovascular status intact negative Homans sign noted with wound edges well coapted heel right     Assessment:  Doing well post endoscopic surgery right     Plan:  Advised this patient on utilization of continued compression elevation as needed and patient will reappoint on an as-needed basis and is encouraged to begin active

## 2018-03-01 ENCOUNTER — Ambulatory Visit: Payer: Medicare Other | Admitting: Cardiovascular Disease

## 2018-03-01 ENCOUNTER — Encounter: Payer: Self-pay | Admitting: Cardiovascular Disease

## 2018-03-01 VITALS — BP 124/66 | HR 67 | Ht 66.0 in | Wt 153.0 lb

## 2018-03-01 DIAGNOSIS — I5022 Chronic systolic (congestive) heart failure: Secondary | ICD-10-CM | POA: Diagnosis not present

## 2018-03-01 MED ORDER — SACUBITRIL-VALSARTAN 97-103 MG PO TABS: 1.0000 | ORAL_TABLET | Freq: Two times a day (BID) | ORAL | 11 refills | Status: DC

## 2018-03-01 NOTE — Patient Instructions (Addendum)
Medication Instructions:  1) INCREASE ENTRESTO to 97-103 mg twice daily  Labwork: You will have labs drawn when you return for your nurse visit in 1 month.  Testing/Procedures: None  Follow-Up: You have a NURSE VISIT on Wednesday, March 29, 2018 at 9:00AM.  Your provider wants you to follow-up in: 6 months with Dr. Acie Fredrickson. You will receive a reminder letter in the mail two months in advance. If you don't receive a letter, please call our office to schedule the follow-up appointment.    Any Other Special Instructions Will Be Listed Below (If Applicable).     If you need a refill on your cardiac medications before your next appointment, please call your pharmacy.

## 2018-03-01 NOTE — Progress Notes (Signed)
Cardiology Office Note   Date:  03/01/2018   ID:  Jaasia, Viglione Jul 17, 1932, MRN [id number]  PCP:  Marton Redwood, MD  Cardiologist:   Mertie Moores, MD   Chief Complaint  Patient presents with  . Congestive Heart Failure   1. Takotsubo Syndrome-  2.actue on chronic systolic CHF - EF 17-51% - , now 40-45  3. Hypertension- 4. Diabetes mellitus 5. LBBB    TASHAWNA THOM is a 84 y.o. female who presents for f/u of her mild MI - Takotsubo syndrome. Feels weak, no CP, Breathing is ok. Has some DOE .   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     TONIYA ROZAR is a 84 y.o. female who presents for follow up of her Takotsubo.  BP at home is ok.  A bit elevated here. 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   September 10, 2014: Ms. Ferrara EF has fallen ( unexpectedly) .  She was diagnosed with Takotsubo  syndrome and has been on a beta blocker and angiotensin blocker since that time. Her ejection fraction has fallen to 25-30%.  She was recently seen by Dr. Brigitte Pulse and had some hypertension. Amlodipine was added to her medical regimen at that time.  September 25, 2014:  Ms. Hopson is seen back today for follow up of her severe LV dysfunction She was started on valsartan ( instead of Losartan ) during her last visit. We increase her Coreg and DC'd the Amlodipine . Her BP has been better.  Breathing is better.   She had foot surgery 2 days ago.  Is getting along ok    October 30, 2014:   She is doing ok. No CP or dyspnea.   Nov. 28, 2016:  Karalyne is seen today for follow up of her Takotsubo syndrome and chronic Systolic CHF. Hx of HTN BP is up a bit today -  Ate some ham over Thanksgiving . She is short of breath at times.   Jan. 17, 2017: Has some dyspnea - particularly with exertion . Walking to the kitchen causes her to have dyspnea.  Is still eating lots of salt .   She craves salt.  Still eating lots of salty snacks.   July 07, 2015: Doing well Staying  fairly active Has some dyspnea.  Oct . 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  Jan. 23, 2018:  Ms. Matthew is doing ok.  Her last echocardiogram 02/03/2016 shows slight improvement of her left ventricle systolic function.  Her ejection fraction is now 40-45% range.  Tolerating the meds well.    Feb. 27, 2018:  Bonni is seen today for Entresto follow up  Seems to be tolerating the Entresto She stopped the Spironolactone - she had orthostasis.   August 24, 2016 Ms. Hamlett is doing well.  Recent echo shows stable EF - 40-45%.  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 4, 2018:  Doing well   No CP or dyspnea discussed going up on the [place].  She does not want to change any of her medications because she is doing so well.  September 20, 2017:   Burgandy  is seen today for follow-up of her congestive heart failure.  She is doing well on the current dose of Entresto. Still working - private duty nurse   Dec. 11, 2019:  Malajah is seen today .  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  Breathing is good  No CP  Tolerating Entresto    Past Medical History:  Diagnosis Date  . Anal fissure   . Chest pain, unspecified   . Diabetes mellitus without complication ([place])   . Glaucoma    pt. states no-glaucoma 07/20/13  . Hypertension   . Other and unspecified hyperlipidemia   . Palpitations   . Pneumonia   . PVC (premature ventricular contraction)   . Rosacea   . Shingles    hx  . Shortness of breath   . Subclinical hypothyroidism     Past Surgical History:  Procedure Laterality Date  . APPENDECTOMY    . bilateral eye surgery/cataract repair  3/09  . BLADDER REPAIR    . BOWEL RESECTION    . LEFT HEART CATHETERIZATION WITH CORONARY ANGIOGRAM N/A 04/24/2014   Procedure: LEFT HEART CATHETERIZATION WITH CORONARY ANGIOGRAM;  Surgeon: Wellington Hampshire, MD;  Location: [facility];  Service: Cardiovascular;  Laterality: N/A;  . TOTAL ABDOMINAL HYSTERECTOMY       Current Outpatient Medications  Medication Sig Dispense Refill  . acetaminophen (TYLENOL) 650 MG CR tablet Take 650-1,300 mg by mouth 2 (two) times daily as needed for pain.    Marland Kitchen ALPRAZolam (XANAX) 0.5 MG tablet Take 0.5 mg by mouth at bedtime.     Marland Kitchen aspirin EC 81 MG EC tablet Take 1 tablet (81 mg total) by mouth daily. 30 tablet 0  . atorvastatin (LIPITOR) 10 MG tablet Take 10 mg by mouth daily at 6 PM.     . carvedilol (COREG) 12.5 MG tablet TAKE 1 TABLET BY MOUTH TWICE DAILY WITH A MEAL 180 tablet 2  . desonide (DESOWEN) 0.05 % cream Apply 1 application topically 2 (two) times daily as needed (for itching/dry skin).    . meclizine (ANTIVERT) 25 MG tablet Take 1 tablet (25 mg total) by mouth 3 (three) times daily as needed for dizziness. 30 tablet 0  . metFORMIN (GLUCOPHAGE) 1000 MG tablet Take 1 tablet (1,000 mg total) by mouth 2 (two) times daily.    . metroNIDAZOLE (METROCREAM) 0.75 % cream Apply 1 application topically daily as needed (to all itchy sites- as directed).     Glory Rosebush VERIO test strip     . spironolactone (ALDACTONE) 25 MG tablet TAKE ONE-HALF TABLET BY MOUTH ONCE DAILY. 45 tablet 3  . SYNTHROID 50 MCG tablet Take 50 mcg by mouth daily.    . sacubitril-valsartan (ENTRESTO) 97-103 MG Take 1 tablet by mouth 2 (two) times daily. 60 tablet 11   No current facility-administered medications for this visit.     Allergies:   Codeine; Januvia [sitagliptin]; Other; Penicillins; and Sulfonamide derivatives    Social History:  The patient  reports that she has never smoked. She has never used smokeless tobacco. She reports that she does not drink alcohol or use drugs.   Family History:  The patient's family history includes Alcoholism in her father; Alzheimer's disease in her mother; Diabetes in her daughter, maternal grandmother, and paternal grandmother; Hypertension in her mother; Other in  her sister; Prostate cancer in her brother; Seizures in her sister; Thyroid disease in her brother, daughter, mother, and son.    ROS:  As per current history .  Physical Exam: Blood pressure 124/66, pulse 67, height 5\' 6"  (1.676 m), weight 153 lb (69.4 kg), SpO2 96 %.  GEN:   Elderly female,  NAD  HEENT: Normal NECK: No JVD; No carotid bruits LYMPHATICS: No lymphadenopathy CARDIAC: RR  RESPIRATORY:  Clear to auscultation without rales, wheezing or rhonchi  ABDOMEN: Soft, non-tender, non-distended MUSCULOSKELETAL:  No edema; No deformity  SKIN: Warm and dry NEUROLOGIC:  Alert and oriented x 3   EKG:  September 20, 2017:   NSR, LBBB with HR of 67.     Recent Labs: 02/06/2018: B Natriuretic Peptide 130.7; BUN 17; Creatinine, Ser 1.13; Hemoglobin 12.3; Platelets 317; Potassium 3.7; Sodium 139    Lipid Panel    Component Value Date/Time   CHOL 147 02/22/2017 0827   TRIG 165 (H) 02/22/2017 0827   HDL 55 02/22/2017 0827   CHOLHDL 2.7 02/22/2017 0827   CHOLHDL 5.1 04/23/2014 0045   VLDL 59 (H) 04/23/2014 0045   LDLCALC 59 02/22/2017 0827      Wt Readings from Last 3 Encounters:  03/01/18 153 lb (69.4 kg)  11/09/17 157 lb (71.2 kg)  09/20/17 157 lb 12.8 oz (71.6 kg)      Other studies Reviewed: Additional studies/ records that were reviewed today include: . Review of the above records demonstrates:    ASSESSMENT AND PLAN:  1.  Takotsubo syndrome:    no recurrence.    2.  Chronic systolic congestive heart failure:   .  She is tolerating middle dose Entresto.  We will increase the Entresto up to 97-103 mg twice a day. Have her return in 1 month for nurse visit and blood pressure check as well as a basic metabolic profile.  I will see her again in 6  months for follow-up visit.   3. Essential hypertension:    BP is well controlled.   4.  Hyperlipidemia:    Stable.,  Followed by Dr. Brigitte Pulse    Current medicines are reviewed at length with the patient today.  The patient  does not have concerns regarding medicines.   Labs/ tests ordered today include:   Orders Placed This Encounter  Procedures  . Basic metabolic panel    Disposition:     Signed, Mertie Moores, MD  03/01/2018 10:05 AM    [facility] [address], [place], [place]  [address] Phone: [phone]; Fax: [phone]

## 2018-03-03 ENCOUNTER — Telehealth: Payer: Self-pay | Admitting: Cardiovascular Disease

## 2018-03-03 NOTE — Telephone Encounter (Signed)
Pt called this morning stating that she gets dizzy avery time since the  Entresto dose was increased to 97-103 mg on 03/01/18. With the Entresto dose of 49-51 mg she was doing well her BP was 124/66. Pt states that she can't take the Entresto (97-103 mg) higher dose anymore. Pt states that she needs a prescription for the lower dose if she can resumed taking the lower dose. Pt is aware that this message will be send to MD for recommendations.

## 2018-03-03 NOTE — Telephone Encounter (Signed)
Pt c/o medication issue:  1. Name of Medication: Entresto  2. How are you currently taking this medication (dosage and times per day)?  2 times a day  3. Are you having a reaction (difficulty breathing--STAT)? no  4. What is your medication issue? Makes her real dizzy, have to sit down

## 2018-03-03 NOTE — Telephone Encounter (Signed)
OK , Lets go back to the previous dose of Entresto  49-51 mg twice a day  Please send in a new script Thanks

## 2018-03-06 NOTE — Telephone Encounter (Signed)
Left message for patient to call back to discuss Entresto dose and will cancel/reschedule nurse visit on 03/29/18.

## 2018-03-08 MED ORDER — SACUBITRIL-VALSARTAN 49-51 MG PO TABS: 1.0000 | ORAL_TABLET | Freq: Two times a day (BID) | ORAL | 11 refills | Status: DC

## 2018-03-08 NOTE — Telephone Encounter (Signed)
Left message for patient that I have ordered Entresto 49-51 mg for her to replace the higher dose Entresto 97-103. I advised her to call back to reschedule nurse visit scheduled for 03/29/18.

## 2018-03-23 NOTE — Telephone Encounter (Signed)
Called patient to reschedule BP check on 1/8 and she advised that she has decreased the Entresto back to 49-51 mg BID because she could not tolerate the 97-103 mg dose. She states she took it for 3 days and was so "swimmy-headed" she couldn't function. She advised that she feels back to normal now. I cancelled the nurse and lab appointments and advised her to call back in the future with questions or concerns. She verbalized understanding and agreement and thanked me for the call.

## 2018-03-29 ENCOUNTER — Ambulatory Visit: Payer: Medicare Other

## 2018-03-30 ENCOUNTER — Ambulatory Visit: Payer: Medicare Other

## 2018-05-19 ENCOUNTER — Other Ambulatory Visit: Payer: Self-pay | Admitting: Cardiovascular Disease

## 2018-05-26 ENCOUNTER — Ambulatory Visit (INDEPENDENT_AMBULATORY_CARE_PROVIDER_SITE_OTHER): Payer: Medicare Other

## 2018-05-26 ENCOUNTER — Encounter: Payer: Self-pay | Admitting: Podiatry

## 2018-05-26 ENCOUNTER — Ambulatory Visit: Payer: Medicare Other | Admitting: Podiatry

## 2018-05-26 DIAGNOSIS — M722 Plantar fascial fibromatosis: Secondary | ICD-10-CM

## 2018-05-26 DIAGNOSIS — M6740 Ganglion, unspecified site: Secondary | ICD-10-CM

## 2018-05-26 MED ORDER — TRIAMCINOLONE ACETONIDE 10 MG/ML IJ SUSP
10.0000 mg | Freq: Once | INTRAMUSCULAR | Status: AC
  Administered 2018-05-26: 10 mg

## 2018-05-28 NOTE — Progress Notes (Signed)
Subjective:   Patient ID: Kayla Medina, female   DOB: 85 y.o.   MRN: [id number]   HPI Patient presents stating that the heel is doing fine but I got this knot in my right arch and I would prefer to have it removed   ROS      Objective:  Physical Exam  Neurovascular status intact with the incision sites healed well on the right plantar heel and no pain in the heel itself but in the mid arch area there is an approximate 2.5 cm x 2.5 cm cyst or possible fibroma or fascial irritation that is present     Assessment:  Difficult to say whether or not this is something different words related to the previous surgery with the possibility that the fascia retracted to this area     Plan:  H&P and I educated her on this and the fact there is no complete understanding about what is causing this but it is possible that it cyst and that I want to try to see if we can shrink it.  I did a sterile prep of the medial side of the right foot I then injected with 3 mg dexamethasone Kenalog 5 mg Xylocaine advised on home heat ice therapy and if it does not improve in the next month we will get an MRI of it with possibility for surgery for this condition

## 2018-06-23 ENCOUNTER — Ambulatory Visit: Payer: Medicare Other | Admitting: Podiatry

## 2018-08-22 ENCOUNTER — Telehealth: Payer: Self-pay

## 2018-08-22 NOTE — Telephone Encounter (Signed)
Pt declined virtual visit and wanted her appt pushed further out. Pt was advised to call us if anything changes, she denies any complications at this time.

## 2018-08-31 ENCOUNTER — Ambulatory Visit: Payer: Medicare Other | Admitting: Cardiovascular Disease

## 2018-10-18 ENCOUNTER — Telehealth: Payer: Self-pay | Admitting: Cardiovascular Disease

## 2018-10-18 MED ORDER — NITROGLYCERIN 0.4 MG SL SUBL: 0.4000 mg | SUBLINGUAL_TABLET | SUBLINGUAL | 6 refills | Status: DC | PRN

## 2018-10-18 NOTE — Telephone Encounter (Signed)
I called Kayla Medina about her CP  CP / pressure , dull ache in left side of chest Has been there for several weeks.   Occurs spontansously .   Is not related to exertion ,   Last for several minutes Several times a day ,    Has not taken her BP recently    BP has been well controlled.  Has been taking coreg 6.25 mg BID  ( she cut dose in half when she developed lightheadedness while standing  Taking entresto 49-51 bid  Spironolactone 12. 5 a day    Will send in script for NTG 0.4 PRN Will get office visit with APP Friday or Monday  [facility]      Mertie Moores, MD  10/18/2018 5:16 PM    [facility] [address]  [place], [place]  [address] Pager [phone] Phone: [phone]; Fax: [phone]

## 2018-10-18 NOTE — Telephone Encounter (Signed)
   Patient scheduled to see Pecolia Ades, NP tomorrow 7/30. She is aware of procedures once she arrives at the office and states she has a mask. She thanked me for the call.   COVID-19 Pre-Screening Questions:  . In the past 7 to 10 days have you had a cough,  shortness of breath, headache, congestion, fever (100 or greater) body aches, chills, sore throat, or sudden loss of taste or sense of smell? No . Have you been around anyone with known Covid 19. No . Have you been around anyone who is awaiting Covid 19 test results in the past 7 to 10 days? No . Have you been around anyone who has been exposed to Covid 19, or has mentioned symptoms of Covid 19 within the past 7 to 10 days? No  If you have any concerns/questions about symptoms patients report during screening (either on the phone or at threshold). Contact the provider seeing the patient or DOD for further guidance.  If neither are available contact a member of the leadership team.

## 2018-10-18 NOTE — Telephone Encounter (Signed)
Pt c/o of Chest Pain: STAT if CP now or developed within 24 hours  1. Are you having CP right now? No-off and on- feels like indigestion  2. Are you experiencing any other symptoms (ex. SOB, nausea, vomiting, sweating)? no  3. How long have you been experiencing CP? 2 to 3 weeks  4. Is your CP continuous or coming and going?  Comes and goes  5. Have you taken Nitroglycerin? Take 5 baby aspirin- she does not have Nitroglycerin- pt would like to be seen ?

## 2018-10-18 NOTE — Addendum Note (Signed)
Addended by: Emmaline Life on: 10/18/2018 05:36 PM   Modules accepted: Orders

## 2018-10-19 ENCOUNTER — Ambulatory Visit (INDEPENDENT_AMBULATORY_CARE_PROVIDER_SITE_OTHER): Payer: Medicare Other | Admitting: Cardiology

## 2018-10-19 ENCOUNTER — Encounter: Payer: Self-pay | Admitting: Cardiology

## 2018-10-19 ENCOUNTER — Other Ambulatory Visit: Payer: Self-pay

## 2018-10-19 VITALS — BP 142/68 | HR 62 | Ht 66.0 in | Wt 158.8 lb

## 2018-10-19 DIAGNOSIS — I10 Essential (primary) hypertension: Secondary | ICD-10-CM | POA: Diagnosis not present

## 2018-10-19 DIAGNOSIS — R0789 Other chest pain: Secondary | ICD-10-CM

## 2018-10-19 DIAGNOSIS — I5022 Chronic systolic (congestive) heart failure: Secondary | ICD-10-CM

## 2018-10-19 DIAGNOSIS — E785 Hyperlipidemia, unspecified: Secondary | ICD-10-CM | POA: Diagnosis not present

## 2018-10-19 NOTE — Patient Instructions (Signed)
Medication Instructions:  Your physician recommends that you continue on your current medications as directed. Please refer to the Current Medication list given to you today.  If you need a refill on your cardiac medications before your next appointment, please call your pharmacy.   Lab work: None   If you have labs (blood work) drawn today and your tests are completely normal, you will receive your results only by: Marland Kitchen MyChart Message (if you have MyChart) OR . A paper copy in the mail If you have any lab test that is abnormal or we need to change your treatment, we will call you to review the results.  Testing/Procedures: None   Follow-Up: Follow up with Dr. Acie Fredrickson on 11/22/2018 @ 8:20 AM  Any Other Special Instructions Will Be Listed Below (If Applicable).  You can take a Tylenol for chest wall pain   Call us if chest pain is exertional or causing shortness of breath or other symptoms

## 2018-10-19 NOTE — Progress Notes (Signed)
Cardiology Office Note:    Date:  10/19/2018   ID:  Kayla Medina, Kayla Medina [birth year], MRN [id number]  PCP:  Marton Redwood, MD  Cardiologist:  Mertie Moores, MD  Referring MD: Marton Redwood, MD   Chief Complaint  Patient presents with  . Chest Pain    History of Present Illness:    Kayla Medina is a 85 y.o. female with a past medical history significant for CHF, Takotsubo syndrome, hypertension, hyperlipidemia, diabetes type 2, orthostasis  [name] had a mild MI and reduction of EF in 04/2014. Cardiac catheterization on 04/24/2014 showed no evidence of obstructive CAD and rest of of stress-induced cardiomyopathy.  Started on carvedilol.  She was later started on Entresto and Spironolactone was stopped due to orthostasis.  Her last echo in 07/2016 showed EF 40-45% which was similar to prior in 01/2016.  Ms. Mikelson was last seen 03/01/2018 by Dr. Acie Fredrickson at which time her Delene Loll was increased to 97-103 mg twice daily. She did not tolerate it due to feeling "sick all over". She tolerates the 49/51 mg.   Patient is being seen today for complaints of left sided dull chest pain that has been present for several weeks, not related to exertion. She has an intermittent left sided chest pain that comes on and then fades away over a few seconds, not related to activity. No worse with movement or deep breathing. She finds herself holding her left arm over her chest. It started a few weeks ago with no change in frequency or intensity. She thought it was a pulled muscle but since it is not constant she does not think that is what it is. She does not recall any injury to her chest. Sometimes at night her heart beats hard when laying down, not fast. She has no associated shortness of breath, dizziness or nausea.  No orthopnea, PND or edema.   She is very tender to palpation, localized to one spot in left chest. She has not tried any pain medications as she has bad reactions to so many medications.   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 She worked until the March 1st, now out due to [place] 19 pandemic. She had a pulled muscle at that time from moving a large pt.   Past Medical History:  Diagnosis Date  . Anal fissure   . Chest pain, unspecified   . Diabetes mellitus without complication ([place])   . Glaucoma    pt. states no-glaucoma 07/20/13  . Hypertension   . Other and unspecified hyperlipidemia   . Palpitations   . Pneumonia   . PVC (premature ventricular contraction)   . Rosacea   . Shingles    hx  . Shortness of breath   . Subclinical hypothyroidism     Past Surgical History:  Procedure Laterality Date  . APPENDECTOMY    . bilateral eye surgery/cataract repair  3/09  . BLADDER REPAIR    . BOWEL RESECTION    . LEFT HEART CATHETERIZATION WITH CORONARY ANGIOGRAM N/A 04/24/2014   Procedure: LEFT HEART CATHETERIZATION WITH CORONARY ANGIOGRAM;  Surgeon: Wellington Hampshire, MD;  Location: [facility];  Service: Cardiovascular;  Laterality: N/A;  . TOTAL ABDOMINAL HYSTERECTOMY      Current Medications: Current Meds  Medication Sig  . acetaminophen (TYLENOL) 650 MG CR tablet Take 650-1,300 mg by mouth 2 (two) times daily as needed for pain.  Marland Kitchen ALPRAZolam (XANAX) 0.5 MG tablet Take 0.5 mg by mouth at bedtime.   Marland Kitchen  aspirin EC 81 MG EC tablet Take 1 tablet (81 mg total) by mouth daily.  Marland Kitchen atorvastatin (LIPITOR) 10 MG tablet Take 10 mg by mouth daily at 6 PM.   . carvedilol (COREG) 12.5 MG tablet TAKE 1 TABLET BY MOUTH TWICE DAILY WITH A MEAL  . desonide (DESOWEN) 0.05 % cream Apply 1 application topically 2 (two) times daily as needed (for itching/dry skin).  . meclizine (ANTIVERT) 25 MG tablet Take 1 tablet (25 mg total) by mouth 3 (three) times daily as needed for dizziness.  . metFORMIN (GLUCOPHAGE) 1000 MG tablet Take 1 tablet (1,000 mg total) by mouth 2 (two) times daily.  . metroNIDAZOLE (METROCREAM) 0.75 % cream Apply 1 application topically daily as needed (to all itchy sites- as directed).   .  nitroGLYCERIN (NITROSTAT) 0.4 MG SL tablet Place 1 tablet (0.4 mg total) under the tongue every 5 (five) minutes as needed for chest pain.  Glory Rosebush VERIO test strip   . sacubitril-valsartan (ENTRESTO) 49-51 MG Take 1 tablet by mouth 2 (two) times daily.  Marland Kitchen spironolactone (ALDACTONE) 25 MG tablet TAKE ONE-HALF TABLET BY MOUTH ONCE DAILY.  . SYNTHROID 50 MCG tablet Take 50 mcg by mouth daily.     Allergies:   Codeine, Januvia [sitagliptin], Other, Penicillins, and Sulfonamide derivatives   Social History   Socioeconomic History  . Marital status: Married    Spouse name: Not on file  . Number of children: 6  . Years of education: Not on file  . Highest education level: Not on file  Occupational History  . Occupation: CNA  Social Needs  . Financial resource strain: Not on file  . Food insecurity    Worry: Not on file    Inability: Not on file  . Transportation needs    Medical: Not on file    Non-medical: Not on file  Tobacco Use  . Smoking status: Never Smoker  . Smokeless tobacco: Never Used  Substance and Sexual Activity  . Alcohol use: No  . Drug use: No  . Sexual activity: Not on file  Lifestyle  . Physical activity    Days per week: Not on file    Minutes per session: Not on file  . Stress: Not on file  Relationships  . Social Herbalist on phone: Not on file    Gets together: Not on file    Attends religious service: Not on file    Active member of club or organization: Not on file    Attends meetings of clubs or organizations: Not on file    Relationship status: Not on file  Other Topics Concern  . 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 Divorced. Fulltime- CNA (elderly/alz pts)     Family History: The patient's family history includes Alcoholism in her father; Alzheimer's disease in her mother; Diabetes in her daughter, maternal grandmother, and paternal grandmother; Hypertension in her mother; Other in her  sister; Prostate cancer in her brother; Seizures in her sister; Thyroid disease in her brother, daughter, mother, and son. ROS:   Please see the history of present illness.     All other systems reviewed and are negative.  EKGs/Labs/Other Studies Reviewed:    The following studies were reviewed today:  Echo 08/17/2016: EF 40-45%, mild hypertrophy of the septum, grade 1 DD, elevated LV filling pressure  Echo 02/03/2016: EF 40-45%, mild LVH, grade 1 DD  Echo 09/06/2014: EF 25-30% with diffuse hypokinesis  EKG:  EKG is ordered today.  The ekg ordered today demonstrates NSR, 62 bpm, LBBB- prior known.   Recent Labs: 02/06/2018: B Natriuretic Peptide 130.7; BUN 17; Creatinine, Ser 1.13; Hemoglobin 12.3; Platelets 317; Potassium 3.7; Sodium 139   Recent Lipid Panel    Component Value Date/Time   CHOL 147 02/22/2017 0827   TRIG 165 (H) 02/22/2017 0827   HDL 55 02/22/2017 0827   CHOLHDL 2.7 02/22/2017 0827   CHOLHDL 5.1 04/23/2014 0045   VLDL 59 (H) 04/23/2014 0045   LDLCALC 59 02/22/2017 0827    Physical Exam:    VS:  BP (!) 142/68   Pulse 62   Ht 5\' 6"  (1.676 m)   Wt 158 lb 12.8 oz (72 kg)   SpO2 95%   BMI 25.63 kg/m     Wt Readings from Last 3 Encounters:  10/19/18 158 lb 12.8 oz (72 kg)  03/01/18 153 lb (69.4 kg)  11/09/17 157 lb (71.2 kg)     Physical Exam  Constitutional: She is oriented to person, place, and time. She appears well-developed and well-nourished. No distress.  HENT:  Head: Normocephalic.  Neck: Normal range of motion. Neck supple. No JVD present.  Cardiovascular: Normal rate, regular rhythm, normal heart sounds and intact distal pulses. Exam reveals no gallop and no friction rub.  No murmur heard. Pulmonary/Chest: Effort normal and breath sounds normal. No respiratory distress. She has no wheezes. She has no rales.  Abdominal: Soft. Bowel sounds are normal.  Musculoskeletal: Normal range of motion.        General: No edema.     Comments: Localized  tenderness to palpation of left anterior chest, just to the left of sternum  Neurological: She is alert and oriented to person, place, and time.  Skin: Skin is warm and dry.  Psychiatric: She has a normal mood and affect. Her behavior is normal. Judgment and thought content normal.  Nursing note and vitals reviewed.    ASSESSMENT:    1. Other chest pain   2. Chronic systolic heart failure ([place])   3. Essential (primary) hypertension   4. Hyperlipidemia, unspecified hyperlipidemia type    PLAN:    In order of problems listed above:  Chest pain: Pt with very atypical left chest pain, localized and reproducible with palpation. Not associated with activity. No shortness of breath or other symptoms. Lasts a few seconds. Usually occurs while sitting, not doing anything. I do not feel this is cardiac related. Most likely musculoskeletal. She does not like to take medications due to multiple intolerances. She will try a tylenol as needed. I offered her a topical pain patch to try but she declines. I offered that she can always call us if symptoms get worse and change. She will keep follow up appt with Dr. Acie Fredrickson in September.   Takotsubo syndrome noted in 2016: No recurrences so far.   Chronic systolic heart failure: Treated medically with carvedilol and Entresto.  Spironolactone previously discontinued due to orthostasis. Tolerating current meds well. Could not tolerate highest dose of Entresto but doing well on mid dose. Currently no HF symptoms.   Essential hypertension: BP well controlled. Continue current therapy.   Hyperlipidemia: On atorvastatin 10 mg daily.  Followed by Dr. Brigitte Pulse  Medication Adjustments/Labs and Tests Ordered: Current medicines are reviewed at length with the patient today.  Concerns regarding medicines are outlined above. Labs and tests ordered and medication changes are outlined in the patient instructions below:  Patient Instructions  Medication Instructions:  Your  physician recommends that you continue on your current medications as directed. Please refer to the Current Medication list given to you today.  If you need a refill on your cardiac medications before your next appointment, please call your pharmacy.   Lab work: None   If you have labs (blood work) drawn today and your tests are completely normal, you will receive your results only by: Marland Kitchen MyChart Message (if you have MyChart) OR . A paper copy in the mail If you have any lab test that is abnormal or we need to change your treatment, we will call you to review the results.  Testing/Procedures: None   Follow-Up: Follow up with Dr. Acie Fredrickson on 11/22/2018 @ 8:20 AM  Any Other Special Instructions Will Be Listed Below (If Applicable).  You can take a Tylenol for chest wall pain   Call us if chest pain is exertional or causing shortness of breath or other symptoms        Signed, Daune Perch, NP  10/19/2018 1:15 PM    [facility]

## 2018-11-21 ENCOUNTER — Other Ambulatory Visit: Payer: Self-pay

## 2018-11-21 ENCOUNTER — Telehealth (INDEPENDENT_AMBULATORY_CARE_PROVIDER_SITE_OTHER): Payer: Medicare Other | Admitting: Cardiovascular Disease

## 2018-11-21 VITALS — BP 131/74 | Ht 66.75 in | Wt 158.0 lb

## 2018-11-21 DIAGNOSIS — E782 Mixed hyperlipidemia: Secondary | ICD-10-CM

## 2018-11-21 DIAGNOSIS — R0789 Other chest pain: Secondary | ICD-10-CM | POA: Diagnosis not present

## 2018-11-21 DIAGNOSIS — I10 Essential (primary) hypertension: Secondary | ICD-10-CM

## 2018-11-21 DIAGNOSIS — I5022 Chronic systolic (congestive) heart failure: Secondary | ICD-10-CM | POA: Diagnosis not present

## 2018-11-21 NOTE — Patient Instructions (Signed)

## 2018-11-21 NOTE — Progress Notes (Signed)
Virtual Visit via Telephone Note   This visit type was conducted due to national recommendations for restrictions regarding the COVID-19 Pandemic (e.g. social distancing) in an effort to limit this patient's exposure and mitigate transmission in our community.  Due to her co-morbid illnesses, this patient is at least at moderate risk for complications without adequate follow up.  This format is felt to be most appropriate for this patient at this time.  The patient did not have access to video technology/had technical difficulties with video requiring transitioning to audio format only (telephone).  All issues noted in this document were discussed and addressed.  No physical exam could be performed with this format.  Please refer to the patient's chart for her  consent to telehealth for [facility].   Date:  11/21/2018   ID:  Kayla Medina, DOB 25-Nov-1932, MRN [id number]  Patient Location: Home Provider Location: Office  PCP:  Marton Redwood, MD  Cardiologist:  Mertie Moores, MD  Electrophysiologist:  None   Evaluation Performed:  Follow-Up Visit  Chief Complaint:  CHF follow up   Sept. 1, 2020    Kayla Medina is a 85 y.o. female with hx of chronic systolic CHF.    Has had some atypical CP.   Has resolved.  [place]    The patient does not have symptoms concerning for COVID-19 infection (fever, chills, cough, or new shortness of breath).    Past Medical History:  Diagnosis Date  . Anal fissure   . Chest pain, unspecified   . Diabetes mellitus without complication ([place])   . Glaucoma    pt. states no-glaucoma 07/20/13  . Hypertension   . Other and unspecified hyperlipidemia   . Palpitations   . Pneumonia   . PVC (premature ventricular contraction)   . Rosacea   . Shingles    hx  . Shortness of breath   . Subclinical hypothyroidism    Past Surgical History:  Procedure Laterality Date  . APPENDECTOMY    . bilateral eye surgery/cataract repair  3/09  .  BLADDER REPAIR    . BOWEL RESECTION    . LEFT HEART CATHETERIZATION WITH CORONARY ANGIOGRAM N/A 04/24/2014   Procedure: LEFT HEART CATHETERIZATION WITH CORONARY ANGIOGRAM;  Surgeon: Wellington Hampshire, MD;  Location: [facility];  Service: Cardiovascular;  Laterality: N/A;  . TOTAL ABDOMINAL HYSTERECTOMY       Current Meds  Medication Sig  . acetaminophen (TYLENOL) 650 MG CR tablet Take 650-1,300 mg by mouth 2 (two) times daily as needed for pain.  Marland Kitchen ALPRAZolam (XANAX) 0.5 MG tablet Take 0.5 mg by mouth at bedtime.   Marland Kitchen aspirin EC 81 MG EC tablet Take 1 tablet (81 mg total) by mouth daily.  Marland Kitchen atorvastatin (LIPITOR) 10 MG tablet Take 10 mg by mouth daily at 6 PM.   . carvedilol (COREG) 12.5 MG tablet TAKE 1 TABLET BY MOUTH TWICE DAILY WITH A MEAL  . desonide (DESOWEN) 0.05 % cream Apply 1 application topically 2 (two) times daily as needed (for itching/dry skin).  . meclizine (ANTIVERT) 25 MG tablet Take 1 tablet (25 mg total) by mouth 3 (three) times daily as needed for dizziness.  . metFORMIN (GLUCOPHAGE) 1000 MG tablet Take 1 tablet (1,000 mg total) by mouth 2 (two) times daily.  . metroNIDAZOLE (METROCREAM) 0.75 % cream Apply 1 application topically daily as needed (to all itchy sites- as directed).   . nitroGLYCERIN (NITROSTAT) 0.4 MG SL tablet Place  1 tablet (0.4 mg total) under the tongue every 5 (five) minutes as needed for chest pain.  Glory Rosebush VERIO test strip   . sacubitril-valsartan (ENTRESTO) 49-51 MG Take 1 tablet by mouth 2 (two) times daily.  Marland Kitchen spironolactone (ALDACTONE) 25 MG tablet TAKE ONE-HALF TABLET BY MOUTH ONCE DAILY.  . SYNTHROID 50 MCG tablet Take 50 mcg by mouth daily.     Allergies:   Codeine, Januvia [sitagliptin], Other, Penicillins, and Sulfonamide derivatives   Social History   Tobacco Use  . Smoking status: Never Smoker  . Smokeless tobacco: Never Used  Substance Use Topics  . Alcohol use: No  . Drug use: No     Family Hx: The patient's family history  includes Alcoholism in her father; Alzheimer's disease in her mother; Diabetes in her daughter, maternal grandmother, and paternal grandmother; Hypertension in her mother; Other in her sister; Prostate cancer in her brother; Seizures in her sister; Thyroid disease in her brother, daughter, mother, and son.  ROS:   Please see the history of present illness.     All other systems reviewed and are negative.   Prior CV studies:   The following studies were reviewed today:    Labs/Other Tests and Data Reviewed:    EKG:  No ECG reviewed.  Recent Labs: 02/06/2018: B Natriuretic Peptide 130.7; BUN 17; Creatinine, Ser 1.13; Hemoglobin 12.3; Platelets 317; Potassium 3.7; Sodium 139   Recent Lipid Panel Lab Results  Component Value Date/Time   CHOL 147 02/22/2017 08:27 AM   TRIG 165 (H) 02/22/2017 08:27 AM   HDL 55 02/22/2017 08:27 AM   CHOLHDL 2.7 02/22/2017 08:27 AM   CHOLHDL 5.1 04/23/2014 12:45 AM   LDLCALC 59 02/22/2017 08:27 AM    Wt Readings from Last 3 Encounters:  11/21/18 158 lb (71.7 kg)  10/19/18 158 lb 12.8 oz (72 kg)  03/01/18 153 lb (69.4 kg)     Objective:    Vital Signs:  BP 131/74   Ht 5' 6.75" (1.695 m)   Wt 158 lb (71.7 kg)   BMI 24.93 kg/m      ASSESSMENT & PLAN:    1. Chronic systolic CHF- is very stable .  No dyspnea.  Walks regularly .  No additional CP   Continue meds.  Will see her in 6 months  She is doing very well   2.  Essential hypertension: Her blood pressure is much better controlled.  Continue current medications.  3.  Chest pain: Her chest pains were very atypical.  She is not had episodes.  I doubt that these are due to acute coronary syndrome  4.  Hyperlipidemia: Continue atorvastatin 10 mg a day.  This is being managed per Brigitte Pulse  COVID-19 Education: The signs and symptoms of COVID-19 were discussed with the patient and how to seek care for testing (follow up with PCP or arrange E-visit).  The importance of social distancing was  discussed today.  Time:   Today, I have spent  19 minutes with the patient with telehealth technology discussing the above problems.     Medication Adjustments/Labs and Tests Ordered: Current medicines are reviewed at length with the patient today.  Concerns regarding medicines are outlined above.   Tests Ordered: No orders of the defined types were placed in this encounter.   Medication Changes: No orders of the defined types were placed in this encounter.   Follow Up:  In Person in 6 month(s)  Signed, Mertie Moores, MD  11/21/2018 9:52 AM  [facility]

## 2018-11-22 ENCOUNTER — Ambulatory Visit: Payer: Medicare Other | Admitting: Cardiovascular Disease

## 2018-12-26 ENCOUNTER — Emergency Department (HOSPITAL_COMMUNITY): Payer: Medicare Other

## 2018-12-26 ENCOUNTER — Emergency Department (HOSPITAL_COMMUNITY)
Admission: EM | Admit: 2018-12-26 | Discharge: 2018-12-26 | Disposition: A | Discharge status: Home / Self Care | Payer: Medicare Other | Attending: Emergency Medicine | Admitting: Emergency Medicine

## 2018-12-26 ENCOUNTER — Other Ambulatory Visit: Payer: Self-pay

## 2018-12-26 DIAGNOSIS — I5022 Chronic systolic (congestive) heart failure: Secondary | ICD-10-CM | POA: Insufficient documentation

## 2018-12-26 DIAGNOSIS — Y939 Activity, unspecified: Secondary | ICD-10-CM | POA: Diagnosis not present

## 2018-12-26 DIAGNOSIS — M25531 Pain in right wrist: Secondary | ICD-10-CM | POA: Diagnosis not present

## 2018-12-26 DIAGNOSIS — Z23 Encounter for immunization: Secondary | ICD-10-CM | POA: Diagnosis not present

## 2018-12-26 DIAGNOSIS — Y999 Unspecified external cause status: Secondary | ICD-10-CM | POA: Diagnosis not present

## 2018-12-26 DIAGNOSIS — I110 Hypertensive heart disease with heart failure: Secondary | ICD-10-CM | POA: Diagnosis not present

## 2018-12-26 DIAGNOSIS — S01111A Laceration without foreign body of right eyelid and periocular area, initial encounter: Secondary | ICD-10-CM | POA: Insufficient documentation

## 2018-12-26 DIAGNOSIS — T1490XA Injury, unspecified, initial encounter: Secondary | ICD-10-CM

## 2018-12-26 DIAGNOSIS — Z7984 Long term (current) use of oral hypoglycemic drugs: Secondary | ICD-10-CM | POA: Diagnosis not present

## 2018-12-26 DIAGNOSIS — S0990XA Unspecified injury of head, initial encounter: Secondary | ICD-10-CM | POA: Diagnosis not present

## 2018-12-26 DIAGNOSIS — R519 Headache, unspecified: Secondary | ICD-10-CM | POA: Insufficient documentation

## 2018-12-26 DIAGNOSIS — W19XXXA Unspecified fall, initial encounter: Secondary | ICD-10-CM | POA: Insufficient documentation

## 2018-12-26 DIAGNOSIS — E119 Type 2 diabetes mellitus without complications: Secondary | ICD-10-CM | POA: Diagnosis not present

## 2018-12-26 DIAGNOSIS — Y92009 Unspecified place in unspecified non-institutional (private) residence as the place of occurrence of the external cause: Secondary | ICD-10-CM | POA: Diagnosis not present

## 2018-12-26 DIAGNOSIS — M25561 Pain in right knee: Secondary | ICD-10-CM | POA: Diagnosis not present

## 2018-12-26 LAB — CBG MONITORING, ED: Glucose-Capillary: 133 mg/dL — ABNORMAL HIGH (ref 70–99)

## 2018-12-26 MED ORDER — TETANUS-DIPHTH-ACELL PERTUSSIS 5-2.5-18.5 LF-MCG/0.5 IM SUSP
0.5000 mL | Freq: Once | INTRAMUSCULAR | Status: AC
  Administered 2018-12-26: 0.5 mL via INTRAMUSCULAR
  Filled 2018-12-26: qty 0.5

## 2018-12-26 MED ORDER — ACETAMINOPHEN 325 MG PO TABS
650.0000 mg | ORAL_TABLET | Freq: Once | ORAL | Status: AC
  Administered 2018-12-26: 650 mg via ORAL
  Filled 2018-12-26: qty 2

## 2018-12-26 MED ORDER — LIDOCAINE-EPINEPHRINE (PF) 2 %-1:200000 IJ SOLN
10.0000 mL | Freq: Once | INTRAMUSCULAR | Status: AC
  Administered 2018-12-26: 10 mL
  Filled 2018-12-26: qty 20

## 2018-12-26 NOTE — ED Provider Notes (Signed)
[facility] EMERGENCY DEPARTMENT Provider Note   CSN: [id number] Arrival date & time: 12/26/18  1436     History   Chief Complaint Chief Complaint  Patient presents with  . Fall    HPI Kayla Medina is a 85 y.o. female.     Patient with a mechanical fall at home at 0830 this morning. She landed on both knees, outstretched right hand, and struck her head on a planter. No loss of consciousness. She has a laceration to the right eyebrow. Abrasions to both knees. Mild swelling and tenderness of right knee. Right wrist tenderness.  The history is provided by the patient. No language interpreter was used.  Fall This is a new problem. The current episode started 6 to 12 hours ago. The problem has not changed since onset.Pertinent negatives include no chest pain and no shortness of breath.    Past Medical History:  Diagnosis Date  . Anal fissure   . Chest pain, unspecified   . Diabetes mellitus without complication ([place])   . Glaucoma    pt. states no-glaucoma 07/20/13  . Hypertension   . Other and unspecified hyperlipidemia   . Palpitations   . Pneumonia   . PVC (premature ventricular contraction)   . Rosacea   . Shingles    hx  . Shortness of breath   . Subclinical hypothyroidism     Patient Active Problem List   Diagnosis Date Noted  . Chronic systolic CHF (congestive heart failure) ([place]) 09/10/2014  . HTN (hypertension) 06/12/2014  . Takotsubo syndrome 05/10/2014  . Chest pain 04/23/2014  . Dyspnea 04/23/2014  . Obstructive thrombus 04/23/2014  . Orthostasis 06/01/2013  . Ataxia 06/01/2013  . DM 04/17/2009  . Hyperlipidemia 04/17/2009  . CHEST PAIN-UNSPECIFIED 04/17/2009  . PALPITATIONS 04/14/2009  . DYSPNEA 04/14/2009    Past Surgical History:  Procedure Laterality Date  . APPENDECTOMY    . bilateral eye surgery/cataract repair  3/09  . BLADDER REPAIR    . BOWEL RESECTION    . LEFT HEART CATHETERIZATION WITH CORONARY ANGIOGRAM N/A  04/24/2014   Procedure: LEFT HEART CATHETERIZATION WITH CORONARY ANGIOGRAM;  Surgeon: Wellington Hampshire, MD;  Location: [facility] CATH LAB;  Service: Cardiovascular;  Laterality: N/A;  . TOTAL ABDOMINAL HYSTERECTOMY       OB History   No obstetric history on file.      Home Medications    Prior to Admission medications   Medication Sig Start Date End Date Taking? Authorizing Provider  acetaminophen (TYLENOL) 650 MG CR tablet Take 650-1,300 mg by mouth 2 (two) times daily as needed for pain.    [provider]  ALPRAZolam Duanne Moron) 0.5 MG tablet Take 0.5 mg by mouth at bedtime.     [provider]  aspirin EC 81 MG EC tablet Take 1 tablet (81 mg total) by mouth daily. 04/25/14   Elgergawy, Silver Huguenin, MD  atorvastatin (LIPITOR) 10 MG tablet Take 10 mg by mouth daily at 6 PM.     [provider]  carvedilol (COREG) 12.5 MG tablet TAKE 1 TABLET BY MOUTH TWICE DAILY WITH A MEAL 05/22/18   Nahser, Wonda Cheng, MD  desonide (DESOWEN) 0.05 % cream Apply 1 application topically 2 (two) times daily as needed (for itching/dry skin).    [provider]  meclizine (ANTIVERT) 25 MG tablet Take 1 tablet (25 mg total) by mouth 3 (three) times daily as needed for dizziness. 06/03/13   Shon Baton, MD  metFORMIN (GLUCOPHAGE) 1000  MG tablet Take 1 tablet (1,000 mg total) by mouth 2 (two) times daily. 04/26/14   Elgergawy, Silver Huguenin, MD  metroNIDAZOLE (METROCREAM) 0.75 % cream Apply 1 application topically daily as needed (to all itchy sites- as directed).  01/11/18   [provider]  nitroGLYCERIN (NITROSTAT) 0.4 MG SL tablet Place 1 tablet (0.4 mg total) under the tongue every 5 (five) minutes as needed for chest pain. 10/18/18   Nahser, Wonda Cheng, MD  [facility] VERIO test strip  01/08/18   [provider]  sacubitril-valsartan (ENTRESTO) 49-51 MG Take 1 tablet by mouth 2 (two) times daily. 03/08/18   Nahser, Wonda Cheng, MD  spironolactone (ALDACTONE) 25 MG tablet TAKE ONE-HALF  TABLET BY MOUTH ONCE DAILY. 02/18/16   Nahser, Wonda Cheng, MD  SYNTHROID 50 MCG tablet Take 50 mcg by mouth daily. 05/12/16   [provider]    Family History Family History  Problem Relation Age of Onset  . Alzheimer's disease Mother   . Hypertension Mother   . Thyroid disease Mother   . Alcoholism Father   . Diabetes Paternal Grandmother        entire family  . Diabetes Maternal Grandmother   . Prostate cancer Brother   . Seizures Sister   . Diabetes Daughter        x 2  . Thyroid disease Brother        x2   . Thyroid disease Daughter   . Thyroid disease Son   . Other Sister        meningitis    Social History Social History   Tobacco Use  . Smoking status: Never Smoker  . Smokeless tobacco: Never Used  Substance Use Topics  . Alcohol use: No  . Drug use: No     Allergies   Codeine, Januvia [sitagliptin], Other, Penicillins, and Sulfonamide derivatives   Review of Systems Review of Systems  Respiratory: Negative for shortness of breath.   Cardiovascular: Negative for chest pain.  Musculoskeletal: Negative for back pain and neck pain.  Skin: Positive for wound.  Neurological: Negative for dizziness.  All other systems reviewed and are negative.    Physical Exam Updated Vital Signs BP (!) 147/80 (BP Location: Right Arm)   Pulse 70   Temp (!) 97.5 F (36.4 C) (Oral)   Resp 16   SpO2 95%   Physical Exam Vitals signs reviewed.  Constitutional:      General: She is not in acute distress.    Appearance: She is not diaphoretic.  Eyes:     Extraocular Movements: Extraocular movements intact.     Conjunctiva/sclera: Conjunctivae normal.     Pupils: Pupils are equal, round, and reactive to light.  Neck:     Musculoskeletal: Normal range of motion and neck supple.  Cardiovascular:     Rate and Rhythm: Normal rate and regular rhythm.  Pulmonary:     Effort: Pulmonary effort is normal.     Breath sounds: Normal breath sounds.  Abdominal:      Palpations: Abdomen is soft.  Musculoskeletal:        General: Swelling, tenderness and signs of injury present. No deformity.  Skin:    General: Skin is warm and dry.  Neurological:     Mental Status: She is alert and oriented to person, place, and time.  Psychiatric:        Mood and Affect: Mood normal.      ED Treatments / Results  Labs (all labs ordered are  listed, but only abnormal results are displayed) Labs Reviewed - No data to display  EKG None  Radiology Dg Wrist Complete Right  Result Date: 12/26/2018 CLINICAL DATA:  Fall, pain after fall on outstretched hand. EXAM: RIGHT WRIST - COMPLETE 3+ VIEW COMPARISON:  None. FINDINGS: Degenerative changes about the wrists, worse the carpometacarpal joints. Cortical irregularity best seen on scaphoid view along the lateral margin of the scaphoid. No clear signs of fracture line. Osteopenia limits assessment. IMPRESSION: Question nondisplaced fracture of the mid scaphoid. Marked degenerative changes throughout the wrist. Electronically Signed   By: Zetta Bills M.D.   On: 12/26/2018 15:53   Dg Knee Complete 4 Views Right  Result Date: 12/26/2018 CLINICAL DATA:  Fall, knee pain. EXAM: RIGHT KNEE - COMPLETE 4+ VIEW COMPARISON:  None. FINDINGS: No evidence of fracture, dislocation, or joint effusion. No evidence of arthropathy or other focal bone abnormality. Soft tissues are unremarkable. IMPRESSION: Negative. Electronically Signed   By: Zetta Bills M.D.   On: 12/26/2018 15:54    Procedures .Marland KitchenLaceration Repair  Date/Time: 12/26/2018 5:29 PM Performed by: Etta Quill, NP Authorized by: Etta Quill, NP   Consent:    Consent obtained:  Verbal   Consent given by:  Patient   Risks discussed:  Pain and poor cosmetic result   Alternatives discussed:  No treatment Anesthesia (see MAR for exact dosages):    Anesthesia method:  Local infiltration   Local anesthetic:  Lidocaine 2% WITH epi Laceration details:    Location:  Face    Face location:  R eyebrow   Length (cm):  1.2 Repair type:    Repair type:  Simple Pre-procedure details:    Preparation:  Patient was prepped and draped in usual sterile fashion Exploration:    Wound exploration: entire depth of wound probed and visualized     Wound extent: no foreign bodies/material noted     Contaminated: no   Treatment:    Area cleansed with:  Betadine and saline   Amount of cleaning:  Standard   Irrigation solution:  Sterile saline   Irrigation method:  Syringe Skin repair:    Repair method:  Sutures   Suture size:  5-0   Suture material:  Prolene   Number of sutures:  3 Post-procedure details:    Patient tolerance of procedure:  Tolerated well, no immediate complications   (including critical care time)  Medications Ordered in ED Medications  lidocaine-EPINEPHrine (XYLOCAINE W/EPI) 2 %-1:200000 (PF) injection 10 mL (10 mLs Infiltration Given 12/26/18 1700)  Tdap (BOOSTRIX) injection 0.5 mL (0.5 mLs Intramuscular Given 12/26/18 1856)  acetaminophen (TYLENOL) tablet 650 mg (650 mg Oral Given 12/26/18 1856)     Initial Impression / Assessment and Plan / ED Course  I have reviewed the triage vital signs and the nursing notes.  Pertinent labs & imaging results that were available during my care of the patient were reviewed by me and considered in my medical decision making (see chart for details).       Patient discussed with and seen by Dr. Sedonia Small.    Right wrist xray suspicious for scaphoid fracture. Thumb spica splint, follow-up with Dr. Amedeo Plenty. No acute findings on knee films. Patient given knee sleeve while in ED, conservative therapy recommended and discussed.   Head CT without acute findings. Headache improved with tylenol.  Tetanus updated in ED. Laceration occurred < 12 hours prior to repair. Discussed laceration care with pt and answered questions. Pt to f-u for suture removal in 7  days and wound check sooner should there be signs of  dehiscence or infection. Pt is hemodynamically stable with no complaints prior to dc.  Patient will be discharged home & is agreeable with above plan. Returns precautions discussed. Pt appears safe for discharge.  Final Clinical Impressions(s) / ED Diagnoses   Final diagnoses:  Fall, initial encounter  Eyebrow laceration, right, initial encounter  Right wrist pain  Acute pain of right knee    ED Discharge Orders    None       Etta Quill, NP 12/26/18 1947    Maudie Flakes, MD 12/26/18 2253

## 2018-12-26 NOTE — ED Notes (Signed)
DC instructions discussed and pt verbalizes understanding. Home stable via wc.

## 2018-12-26 NOTE — ED Triage Notes (Signed)
Approx 0830 this morning pt was out on morning walk and was back to her house, going up the stairs when she tripped on the brick step and landed on her right knee, braced fall with R hand, and has lac to R eyelid. Pt denies dizziness prior to fall, did not lose consciousness. Bleeding controlled on eyelid, swelling and bruising to R knee, pain in R wrist. No blood thinners.

## 2018-12-26 NOTE — ED Notes (Signed)
Patient transported to CT 

## 2018-12-26 NOTE — ED Notes (Signed)
Pt states she is hungry and leaving. Removes monitors. Pt given [place] tray and tolerating well.

## 2018-12-26 NOTE — Progress Notes (Signed)
Orthopedic Tech Progress Note Patient Details:  SHANNAH VANNATTA 1932/10/17 [id number] Applied knee sleeve and thumb spica to patient Ortho Devices Type of Ortho Device: Thumb velcro splint, Knee Sleeve Ortho Device/Splint Location: URE, LRE Ortho Device/Splint Interventions: Adjustment, Application, Ordered   Post Interventions Patient Tolerated: Ambulated well, Well Instructions Provided: Poper ambulation with device, Care of device, Adjustment of device   Janit Pagan 12/26/2018, 7:43 PM

## 2018-12-26 NOTE — Discharge Instructions (Signed)
Please follow-up with Dr. Amedeo Plenty. Please wear wrist splint. You may remove to shower/wash hands, but then reapply. Suture removal in 7-10 days. Your primary care provider should be able to remove the sutures.

## 2019-03-22 ENCOUNTER — Other Ambulatory Visit: Payer: Self-pay | Admitting: Cardiovascular Disease

## 2019-04-18 ENCOUNTER — Ambulatory Visit: Payer: Medicare Other

## 2019-04-19 ENCOUNTER — Ambulatory Visit: Payer: Medicare Other

## 2019-04-30 ENCOUNTER — Ambulatory Visit: Payer: Medicare Other | Attending: Internal Medicine

## 2019-04-30 DIAGNOSIS — Z23 Encounter for immunization: Secondary | ICD-10-CM

## 2019-04-30 NOTE — Progress Notes (Signed)
   Covid-19 Vaccination Clinic  Name:  Kayla Medina    MRN: [id number] DOB: [birth year]  04/30/2019  [name] was observed post Covid-19 immunization for 15 minutes without incidence. She was provided with Vaccine Information Sheet and instruction to access the V-Safe system.   [name] was instructed to call 911 with any severe reactions post vaccine: Marland Kitchen Difficulty breathing  . Swelling of your face and throat  . A fast heartbeat  . A bad rash all over your body  . Dizziness and weakness    Immunizations Administered    Name Date Dose VIS Date Route   Pfizer COVID-19 Vaccine 04/30/2019  9:35 AM 0.3 mL 03/02/2019 Intramuscular   Manufacturer: [place]   Lot: [id number]   [place]: [id number]

## 2019-05-21 ENCOUNTER — Ambulatory Visit: Payer: Medicare Other | Admitting: Cardiovascular Disease

## 2019-05-24 ENCOUNTER — Ambulatory Visit: Payer: Medicare Other | Attending: Internal Medicine

## 2019-05-24 DIAGNOSIS — Z23 Encounter for immunization: Secondary | ICD-10-CM

## 2019-05-24 NOTE — Progress Notes (Signed)
   Covid-19 Vaccination Clinic  Name:  Kayla Medina    MRN: [id number] DOB: 1933-02-10  05/24/2019  Kayla Medina was observed post Covid-19 immunization for 15 minutes without incident. She was provided with Vaccine Information Sheet and instruction to access the V-Safe system.   Kayla Medina was instructed to call 911 with any severe reactions post vaccine: Marland Kitchen Difficulty breathing  . Swelling of face and throat  . A fast heartbeat  . A bad rash all over body  . Dizziness and weakness   Immunizations Administered    Name Date Dose VIS Date Route   Pfizer COVID-19 Vaccine 05/24/2019  4:29 PM 0.3 mL 03/02/2019 Intramuscular   Manufacturer: [place]   Lot: [id number]   [place]: [id number]

## 2019-06-11 ENCOUNTER — Other Ambulatory Visit: Payer: Self-pay | Admitting: Cardiovascular Disease

## 2019-06-12 ENCOUNTER — Encounter: Payer: Self-pay | Admitting: *Deleted

## 2019-06-14 ENCOUNTER — Other Ambulatory Visit: Payer: Self-pay

## 2019-06-14 ENCOUNTER — Encounter: Payer: Self-pay | Admitting: Cardiovascular Disease

## 2019-06-14 ENCOUNTER — Ambulatory Visit: Payer: Medicare Other | Admitting: Cardiovascular Disease

## 2019-06-14 VITALS — BP 114/62 | HR 70 | Ht 66.0 in | Wt 161.0 lb

## 2019-06-14 DIAGNOSIS — E782 Mixed hyperlipidemia: Secondary | ICD-10-CM | POA: Diagnosis not present

## 2019-06-14 DIAGNOSIS — I10 Essential (primary) hypertension: Secondary | ICD-10-CM | POA: Diagnosis not present

## 2019-06-14 DIAGNOSIS — I5022 Chronic systolic (congestive) heart failure: Secondary | ICD-10-CM

## 2019-06-14 LAB — BASIC METABOLIC PANEL
BUN/Creatinine Ratio: 20 (ref 12–28)
BUN: 20 mg/dL (ref 8–27)
CO2: 20 mmol/L (ref 20–29)
Calcium: 9.6 mg/dL (ref 8.7–10.3)
Chloride: 105 mmol/L (ref 96–106)
Creatinine, Ser: 1.01 mg/dL — ABNORMAL HIGH (ref 0.57–1.00)
GFR calc Af Amer: 59 mL/min/{1.73_m2} — ABNORMAL LOW (ref >59)
GFR calc non Af Amer: 51 mL/min/{1.73_m2} — ABNORMAL LOW (ref >59)
Glucose: 197 mg/dL — ABNORMAL HIGH (ref 65–99)
Potassium: 4.5 mmol/L (ref 3.5–5.2)
Sodium: 141 mmol/L (ref 134–144)

## 2019-06-14 LAB — LIPID PANEL
Chol/HDL Ratio: 2.4 ratio (ref 0.0–4.4)
Cholesterol, Total: 138 mg/dL (ref 100–199)
HDL: 58 mg/dL (ref >39)
LDL Chol Calc (NIH): 55 mg/dL (ref 0–99)
Triglycerides: 145 mg/dL (ref 0–149)
VLDL Cholesterol Cal: 25 mg/dL (ref 5–40)

## 2019-06-14 LAB — HEPATIC FUNCTION PANEL
ALT: 12 IU/L (ref 0–32)
AST: 17 IU/L (ref 0–40)
Albumin: 4.2 g/dL (ref 3.6–4.6)
Alkaline Phosphatase: 73 IU/L (ref 39–117)
Bilirubin Total: <0.2 mg/dL (ref 0.0–1.2)
Bilirubin, Direct: 0.09 mg/dL (ref 0.00–0.40)
Total Protein: 6.5 g/dL (ref 6.0–8.5)

## 2019-06-14 NOTE — Progress Notes (Signed)
Cardiology Office Note   Date:  06/14/2019   ID:  Kayla Medina, Kayla Medina 1932/05/07, MRN [id number]  PCP:  Marton Redwood, MD  Cardiologist:   Mertie Moores, MD   Chief Complaint  Patient presents with  . Congestive Heart Failure  . Hypertension   1. Takotsubo Syndrome-  2.actue on chronic systolic CHF - EF 13-08% - , now 40-45  3. Hypertension- 4. Diabetes mellitus 5. LBBB    Kayla Medina is a 86 y.o. female who presents for f/u of her mild MI - Takotsubo syndrome. Feels weak, no CP, Breathing is ok. Has some DOE .   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     Kayla Medina is a 86 y.o. female who presents for follow up of her Takotsubo.  BP at home is ok.  A bit elevated here. 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   September 10, 2014: Kayla Medina EF has fallen ( unexpectedly) .  She was diagnosed with Takotsubo  syndrome and has been on a beta blocker and angiotensin blocker since that time. Her ejection fraction has fallen to 25-30%.  She was recently seen by Dr. Brigitte Pulse and had some hypertension. Amlodipine was added to her medical regimen at that time.  September 25, 2014:  Kayla Medina is seen back today for follow up of her severe LV dysfunction She was started on valsartan ( instead of Losartan ) during her last visit. We increase her Coreg and DC'd the Amlodipine . Her BP has been better.  Breathing is better.   She had foot surgery 2 days ago.  Is getting along ok    October 30, 2014:   She is doing ok. No CP or dyspnea.   Nov. 28, 2016:  Kayla Medina is seen today for follow up of her Takotsubo syndrome and chronic Systolic CHF. Hx of HTN BP is up a bit today -  Ate some ham over Thanksgiving . She is short of breath at times.   Jan. 17, 2017: Has some dyspnea - particularly with exertion . Walking to the kitchen causes her to have dyspnea.  Is still eating lots of salt .   She craves salt.  Still eating lots of salty snacks.   July 07, 2015: Doing  well Staying fairly active Has some dyspnea.  Oct . 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  Jan. 23, 2018:  Kayla Medina is doing ok.  Her last echocardiogram 02/03/2016 shows slight improvement of her left ventricle systolic function.  Her ejection fraction is now 40-45% range.  Tolerating the meds well.    Feb. 27, 2018:  Kayla Medina is seen today for Entresto follow up  Seems to be tolerating the Entresto She stopped the Spironolactone - she had orthostasis.   August 24, 2016 Kayla Medina is doing well.  Recent echo shows stable EF - 40-45%.  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 4, 2018:  Doing well   No CP or dyspnea discussed going up on the [place].  She does not want to change any of her medications because she is doing so well.  September 20, 2017:   Kayla Medina  is seen today for follow-up of her congestive heart failure.  She is doing well on the current dose of Entresto. Still working - Systems analyst   Dec.  11, 2019:  Kayla Medina is seen today .  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  Breathing is good  No CP  Tolerating Entresto    June 14, 2019:  Kayla Medina  is seen today for follow-up of her congestive heart failure and hypertension.  She has been on maximum dose Entresto for the past year.  I last saw her in the office 2 years ago.  We did a telemedicine visit last year.   Her last echocardiogram was in May, 2018 which revealed an ejection fraction of 40 to 45% and grade 1 diastolic dysfunction. She did not tolerate high dose entresto,  Is on Entresto 49-51 BID and is tolerating it well      Past Medical History:  Diagnosis Date  . Anal fissure   . Ataxia 06/01/2013  . Chest pain, unspecified   . Chronic systolic CHF (congestive heart failure) ([place]) 09/10/2014  . Diabetes mellitus without complication ([place])   . Glaucoma    pt. states no-glaucoma 07/20/13  .  Hypertension   . Obstructive thrombus 04/23/2014  . Orthostasis 06/01/2013  . Other and unspecified hyperlipidemia   . Palpitations   . Pneumonia   . PVC (premature ventricular contraction)   . Rosacea   . Shingles    hx  . Shortness of breath   . Subclinical hypothyroidism   . Takotsubo syndrome 05/10/2014    Past Surgical History:  Procedure Laterality Date  . APPENDECTOMY    . bilateral eye surgery/cataract repair  3/09  . BLADDER REPAIR    . BOWEL RESECTION    . LEFT HEART CATHETERIZATION WITH CORONARY ANGIOGRAM N/A 04/24/2014   Procedure: LEFT HEART CATHETERIZATION WITH CORONARY ANGIOGRAM;  Surgeon: Wellington Hampshire, MD;  Location: [facility] CATH LAB;  Service: Cardiovascular;  Laterality: N/A;  . TOTAL ABDOMINAL HYSTERECTOMY       Current Outpatient Medications  Medication Sig Dispense Refill  . acetaminophen (TYLENOL) 650 MG CR tablet Take 650-1,300 mg by mouth 2 (two) times daily as needed for pain.    Marland Kitchen ALPRAZolam (XANAX) 0.5 MG tablet Take 0.5 mg by mouth at bedtime.     Marland Kitchen aspirin EC 81 MG EC tablet Take 1 tablet (81 mg total) by mouth daily. 30 tablet 0  . atorvastatin (LIPITOR) 10 MG tablet Take 10 mg by mouth daily at 6 PM.     . Blood Glucose Monitoring Suppl ([place]) w/Device KIT 1 each by Other route as needed.    . carvedilol (COREG) 12.5 MG tablet TAKE 1 TABLET BY MOUTH TWICE DAILY WITH A MEAL 180 tablet 1  . desonide (DESOWEN) 0.05 % cream Apply 1 application topically 2 (two) times daily as needed (for itching/dry skin).    Marland Kitchen ENTRESTO 49-51 MG Take 1 tablet by mouth twice daily 60 tablet 5  . meclizine (ANTIVERT) 25 MG tablet Take 1 tablet (25 mg total) by mouth 3 (three) times daily as needed for dizziness. 30 tablet 0  . metFORMIN (GLUCOPHAGE) 1000 MG tablet Take 1 tablet (1,000 mg total) by mouth 2 (two) times daily.    . metroNIDAZOLE (METROCREAM) 0.75 % cream Apply 1 application topically daily as needed (to all itchy sites- as directed).     .  nitroGLYCERIN (NITROSTAT) 0.4 MG SL tablet Place 1 tablet (0.4 mg total) under the tongue every 5 (five) minutes as needed for chest pain. 25 tablet 6  . ONETOUCH VERIO test strip     .  spironolactone (ALDACTONE) 25 MG tablet TAKE ONE-HALF TABLET BY MOUTH ONCE DAILY. 45 tablet 3  . SYNTHROID 50 MCG tablet Take 50 mcg by mouth daily.    . SYSTANE ULTRA 0.4-0.3 % SOLN Place 2 drops into both eyes as needed.     No current facility-administered medications for this visit.    Allergies:   Codeine, Januvia [sitagliptin], Other, Penicillins, and Sulfonamide derivatives    Social History:  The patient  reports that she has never smoked. She has never used smokeless tobacco. She reports that she does not drink alcohol or use drugs.   Family History:  The patient's family history includes Alcoholism in her father; Alzheimer's disease in her mother; Diabetes in her daughter, maternal grandmother, and paternal grandmother; Hypertension in her mother; Other in her sister; Prostate cancer in her brother; Seizures in her sister; Thyroid disease in her brother, daughter, mother, and son.    ROS:  As per current history .   Physical Exam: Blood pressure 114/62, pulse 70, height _0  (1.676 m), weight 161 lb (73 kg), SpO2 98 %.  GEN:  Well nourished, well developed in no acute distress HEENT: Normal NECK: No JVD; No carotid bruits LYMPHATICS: No lymphadenopathy CARDIAC: RRR , no murmurs, rubs, gallops RESPIRATORY:  Clear to auscultation without rales, wheezing or rhonchi  ABDOMEN: Soft, non-tender, non-distended MUSCULOSKELETAL:  No edema; No deformity  SKIN: Warm and dry NEUROLOGIC:  Alert and oriented x 3    EKG:     Recent Labs: No results found for requested labs within last 8760 hours.    Lipid Panel    Component Value Date/Time   CHOL 147 02/22/2017 0827   TRIG 165 (H) 02/22/2017 0827   HDL 55 02/22/2017 0827   CHOLHDL 2.7 02/22/2017 0827   CHOLHDL 5.1 04/23/2014 0045   VLDL  59 (H) 04/23/2014 0045   LDLCALC 59 02/22/2017 0827      Wt Readings from Last 3 Encounters:  06/14/19 161 lb (73 kg)  12/26/18 158 lb (71.7 kg)  11/21/18 158 lb (71.7 kg)      Other studies Reviewed: Additional studies/ records that were reviewed today include: . Review of the above records demonstrates:    ASSESSMENT AND PLAN:  1.  Takotsubo syndrome:   She is doing well.  There is no further evidence of her Takotsubo syndrome.  2.  Chronic systolic congestive heart failure:   .  Left ventricular systolic function has gradually improved over the years.  Her last ejection fraction was 40 to 45%.  We will repeat her echocardiogram since she is on Entresto.  I think this is the highest dose of Entresto that she will tolerate.  We tried her on higher dose of Entresto but it caused severe orthostatic hypotension.   3. Essential hypertension:   Blood pressure is well controlled currently..   4.  Hyperlipidemia:    Continue atorvastatin.  We will check fasting lipids, liver enzymes, basic metabolic profile today.  Current medicines are reviewed at length with the patient today.  The patient does not have concerns regarding medicines.   Labs/ tests ordered today include:   Orders Placed This Encounter  Procedures  . Lipid Profile  . Basic Metabolic Panel (BMET)  . Hepatic function panel  . ECHOCARDIOGRAM COMPLETE    Disposition:     Signed, Mertie Moores, MD  06/14/2019 8:31 AM    [facility] [address], [place], [place]  [address] Phone: [phone]; Fax: [phone]

## 2019-06-14 NOTE — Patient Instructions (Addendum)
Medication Instructions:  Your physician recommends that you continue on your current medications as directed. Please refer to the Current Medication list given to you today.  *If you need a refill on your cardiac medications before your next appointment, please call your pharmacy*   Lab Work: TODAY - cholesterol, liver panel, basic metabolic panel If you have labs (blood work) drawn today and your tests are completely normal, you will receive your results only by: Marland Kitchen MyChart Message (if you have MyChart) OR . A paper copy in the mail If you have any lab test that is abnormal or we need to change your treatment, we will call you to review the results.   Testing/Procedures: Your physician has requested that you have an echocardiogram. Echocardiography is a painless test that uses sound waves to create images of your heart. It provides your doctor with information about the size and shape of your heart and how well your heart's chambers and valves are working. This procedure takes approximately one hour. There are no restrictions for this procedure.     Follow-Up: At [facility], you and your health needs are our priority.  As part of our continuing mission to provide you with exceptional heart care, we have created designated Provider Care Teams.  These Care Teams include your primary Cardiologist (physician) and Advanced Practice Providers (APPs -  Physician Assistants and Nurse Practitioners) who all work together to provide you with the care you need, when you need it.  Your next appointment:   6 month(s)  The format for your next appointment:   In Person  Provider:   You may see Mertie Moores, MD or one of the following Advanced Practice Providers on your designated Care Team:    Richardson Dopp, PA-C  [place], [place]  Daune Perch, [place]

## 2019-06-26 ENCOUNTER — Other Ambulatory Visit (HOSPITAL_COMMUNITY): Payer: Medicare Other

## 2019-07-05 ENCOUNTER — Ambulatory Visit (HOSPITAL_COMMUNITY): Payer: Medicare Other | Attending: Internal Medicine

## 2019-07-05 ENCOUNTER — Other Ambulatory Visit: Payer: Self-pay

## 2019-07-05 DIAGNOSIS — I5022 Chronic systolic (congestive) heart failure: Secondary | ICD-10-CM | POA: Diagnosis present

## 2019-09-16 ENCOUNTER — Other Ambulatory Visit: Payer: Self-pay | Admitting: Cardiovascular Disease

## 2019-12-11 ENCOUNTER — Ambulatory Visit: Payer: Medicare Other | Admitting: Cardiovascular Disease

## 2020-01-15 ENCOUNTER — Encounter: Payer: Self-pay | Admitting: Cardiovascular Disease

## 2020-01-15 NOTE — Progress Notes (Signed)
Cardiology Office Note   Date:  01/16/2020   ID:  Kayla Medina, Kayla Medina 1932-03-23, MRN [id number]  PCP:  Marton Redwood, MD  Cardiologist:   Mertie Moores, MD   Chief Complaint  Patient presents with  . Congestive Heart Failure  . Hypertension   1. Takotsubo Syndrome-  2.actue on chronic systolic CHF - EF 16-55% - , now 40-45  3. Hypertension- 4. Diabetes mellitus 5. LBBB    Kayla Medina is a 86 y.o. female who presents for f/u of her mild MI - Takotsubo syndrome. Feels weak, no CP, Breathing is ok. Has some DOE .   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     Kayla Medina is a 86 y.o. female who presents for follow up of her Takotsubo.  BP at home is ok.  A bit elevated here. 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   September 10, 2014: Kayla Medina EF has fallen ( unexpectedly) .  She was diagnosed with Takotsubo  syndrome and has been on a beta blocker and angiotensin blocker since that time. Her ejection fraction has fallen to 25-30%.  She was recently seen by Dr. Brigitte Pulse and had some hypertension. Amlodipine was added to her medical regimen at that time.  September 25, 2014:  Kayla Medina is seen back today for follow up of her severe LV dysfunction She was started on valsartan ( instead of Losartan ) during her last visit. We increase her Coreg and DC'd the Amlodipine . Her BP has been better.  Breathing is better.   She had foot surgery 2 days ago.  Is getting along ok    October 30, 2014:   She is doing ok. No CP or dyspnea.   Nov. 28, 2016:  Kayla Medina is seen today for follow up of her Takotsubo syndrome and chronic Systolic CHF. Hx of HTN BP is up a bit today -  Ate some ham over Thanksgiving . She is short of breath at times.   Jan. 17, 2017: Has some dyspnea - particularly with exertion . Walking to the kitchen causes her to have dyspnea.  Is still eating lots of salt .   She craves salt.  Still eating lots of salty snacks.   July 07, 2015: Doing  well Staying fairly active Has some dyspnea.  Oct . 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  Jan. 23, 2018:  Kayla Medina is doing ok.  Her last echocardiogram 02/03/2016 shows slight improvement of her left ventricle systolic function.  Her ejection fraction is now 40-45% range.  Tolerating the meds well.    Feb. 27, 2018:  Kayla Medina is seen today for Entresto follow up  Seems to be tolerating the Entresto She stopped the Spironolactone - she had orthostasis.   August 24, 2016 Kayla Medina is doing well.  Recent echo shows stable EF - 40-45%.  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 4, 2018:  Doing well   No CP or dyspnea discussed going up on the [place].  She does not want to change any of her medications because she is doing so well.  September 20, 2017:   Kayla Medina  is seen today for follow-up of her congestive heart failure.  She is doing well on the current dose of Entresto. Still working - Systems analyst   Dec.  11, 2019:  Kayla Medina is seen today .  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  Breathing is good  No CP  Tolerating Entresto    June 14, 2019:  Kayla Medina  is seen today for follow-up of her congestive heart failure and hypertension.  She has been on maximum dose Entresto for the past year.  I last saw her in the office 2 years ago.  We did a telemedicine visit last year.   Her last echocardiogram was in May, 2018 which revealed an ejection fraction of 40 to 45% and grade 1 diastolic dysfunction. She did not tolerate high dose entresto,  Is on Entresto 49-51 BID and is tolerating it well    Oct. 27, 2021:  Kayla Medina is seen today for follow up of her CHF. Her EF has improved on Entresto and standard CHF therapy. Echo from April, 2021 showed EF of 50%.  She has grade 1 diastolic dysfunction. Has developed vertigo.   Is on meclizine.     Past Medical History:   Diagnosis Date  . Anal fissure   . Ataxia 06/01/2013  . Chest pain, unspecified   . Chronic systolic CHF (congestive heart failure) ([place]) 09/10/2014  . Diabetes mellitus without complication ([place])   . Glaucoma    pt. states no-glaucoma 07/20/13  . Hypertension   . Obstructive thrombus 04/23/2014  . Orthostasis 06/01/2013  . Other and unspecified hyperlipidemia   . Palpitations   . Pneumonia   . PVC (premature ventricular contraction)   . Rosacea   . Shingles    hx  . Shortness of breath   . Subclinical hypothyroidism   . Takotsubo syndrome 05/10/2014    Past Surgical History:  Procedure Laterality Date  . APPENDECTOMY    . bilateral eye surgery/cataract repair  3/09  . BLADDER REPAIR    . BOWEL RESECTION    . LEFT HEART CATHETERIZATION WITH CORONARY ANGIOGRAM N/A 04/24/2014   Procedure: LEFT HEART CATHETERIZATION WITH CORONARY ANGIOGRAM;  Surgeon: Wellington Hampshire, MD;  Location: [facility] CATH LAB;  Service: Cardiovascular;  Laterality: N/A;  . TOTAL ABDOMINAL HYSTERECTOMY       Current Outpatient Medications  Medication Sig Dispense Refill  . acetaminophen (TYLENOL) 650 MG CR tablet Take 650-1,300 mg by mouth 2 (two) times daily as needed for pain.    Marland Kitchen ALPRAZolam (XANAX) 0.5 MG tablet Take 0.5 mg by mouth at bedtime.     Marland Kitchen aspirin EC 81 MG EC tablet Take 1 tablet (81 mg total) by mouth daily. 30 tablet 0  . atorvastatin (LIPITOR) 10 MG tablet Take 10 mg by mouth daily at 6 PM.     . Blood Glucose Monitoring Suppl ([place]) w/Device KIT 1 each by Other route as needed.    . carvedilol (COREG) 12.5 MG tablet TAKE 1 TABLET BY MOUTH TWICE DAILY WITH A MEAL 180 tablet 1  . desonide (DESOWEN) 0.05 % cream Apply 1 application topically 2 (two) times daily as needed (for itching/dry skin).    Marland Kitchen ENTRESTO 49-51 MG Take 1 tablet by mouth twice daily 60 tablet 8  . EYSUVIS 0.25 % SUSP Place 1 drop into both eyes in the morning, at noon, in the evening, and at bedtime.    .  meclizine (ANTIVERT) 25 MG tablet Take 1 tablet (25 mg total) by mouth 3 (three) times daily as needed for dizziness. 30 tablet 0  . metFORMIN (GLUCOPHAGE) 1000 MG tablet  Take 1 tablet (1,000 mg total) by mouth 2 (two) times daily.    . metroNIDAZOLE (METROCREAM) 0.75 % cream Apply 1 application topically daily as needed (to all itchy sites- as directed).     . nitroGLYCERIN (NITROSTAT) 0.4 MG SL tablet Place 1 tablet (0.4 mg total) under the tongue every 5 (five) minutes as needed for chest pain. 25 tablet 6  . ONETOUCH VERIO test strip     . spironolactone (ALDACTONE) 25 MG tablet TAKE ONE-HALF TABLET BY MOUTH ONCE DAILY. 45 tablet 3  . SYNTHROID 50 MCG tablet Take 50 mcg by mouth daily.    . SYSTANE ULTRA 0.4-0.3 % SOLN Place 2 drops into both eyes as needed.     No current facility-administered medications for this visit.    Allergies:   Codeine, Januvia [sitagliptin], Other, Penicillins, and Sulfonamide derivatives    Social History:  The patient  reports that she has never smoked. She has never used smokeless tobacco. She reports that she does not drink alcohol and does not use drugs.   Family History:  The patient's family history includes Alcoholism in her father; Alzheimer's disease in her mother; Diabetes in her daughter, maternal grandmother, and paternal grandmother; Hypertension in her mother; Other in her sister; Prostate cancer in her brother; Seizures in her sister; Thyroid disease in her brother, daughter, mother, and son.    ROS:  As per current history .   Physical Exam: Blood pressure 132/76, pulse 68, height '5\' 6"'  (1.676 m), weight 157 lb 6.4 oz (71.4 kg), SpO2 96 %.  GEN:  Well nourished, well developed in no acute distress HEENT: Normal NECK: No JVD; No carotid bruits LYMPHATICS: No lymphadenopathy CARDIAC: RRR , no murmurs, rubs, gallops RESPIRATORY:  Clear to auscultation without rales, wheezing or rhonchi  ABDOMEN: Soft, non-tender,  non-distended MUSCULOSKELETAL:  No edema; No deformity  SKIN: Warm and dry NEUROLOGIC:  Alert and oriented x 3     EKG:     Recent Labs: 06/14/2019: ALT 12; BUN 20; Creatinine, Ser 1.01; Potassium 4.5; Sodium 141    Lipid Panel    Component Value Date/Time   CHOL 138 06/14/2019 0843   TRIG 145 06/14/2019 0843   HDL 58 06/14/2019 0843   CHOLHDL 2.4 06/14/2019 0843   CHOLHDL 5.1 04/23/2014 0045   VLDL 59 (H) 04/23/2014 0045   LDLCALC 55 06/14/2019 0843      Wt Readings from Last 3 Encounters:  01/16/20 157 lb 6.4 oz (71.4 kg)  06/14/19 161 lb (73 kg)  12/26/18 158 lb (71.7 kg)      Other studies Reviewed: Additional studies/ records that were reviewed today include: . Review of the above records demonstrates:    ASSESSMENT AND PLAN:  1.  Takotsubo syndrome:   No evidence of recurrent Takotsubo syndrome.  Continue current medications.  2.  Chronic systolic congestive heart failure:   .  Left ventricular systolic function has gradually improved over the years.  Last echocardiogram shows low normal left ventricular systolic function with an EF of 50%.  Continue current medications. Will check BMP today     3. Essential hypertension:   Blood pressure is well controlled.  4.  Hyperlipidemia:    She is on atorvastatin.  This is managed by Dr. Brigitte Pulse.  She will follow-up with Dr. Brigitte Pulse.   Current medicines are reviewed at length with the patient today.  The patient does not have concerns regarding medicines.   Labs/ tests ordered today include:   Orders  Placed This Encounter  Procedures  . Basic metabolic panel    Disposition:     Signed, Mertie Moores, MD  01/16/2020 8:55 AM    [facility] [place], [place],   [address] Phone: [phone]; Fax: [phone]

## 2020-01-16 ENCOUNTER — Other Ambulatory Visit: Payer: Self-pay

## 2020-01-16 ENCOUNTER — Ambulatory Visit: Payer: Medicare Other | Admitting: Cardiovascular Disease

## 2020-01-16 ENCOUNTER — Encounter: Payer: Self-pay | Admitting: Cardiovascular Disease

## 2020-01-16 VITALS — BP 132/76 | HR 68 | Ht 66.0 in | Wt 157.4 lb

## 2020-01-16 DIAGNOSIS — I10 Essential (primary) hypertension: Secondary | ICD-10-CM

## 2020-01-16 DIAGNOSIS — I5181 Takotsubo syndrome: Secondary | ICD-10-CM | POA: Diagnosis not present

## 2020-01-16 DIAGNOSIS — I5022 Chronic systolic (congestive) heart failure: Secondary | ICD-10-CM

## 2020-01-16 LAB — BASIC METABOLIC PANEL
BUN/Creatinine Ratio: 16 (ref 12–28)
BUN: 16 mg/dL (ref 8–27)
CO2: 23 mmol/L (ref 20–29)
Calcium: 9.4 mg/dL (ref 8.7–10.3)
Chloride: 105 mmol/L (ref 96–106)
Creatinine, Ser: 0.98 mg/dL (ref 0.57–1.00)
GFR calc Af Amer: 61 mL/min/{1.73_m2} (ref >59)
GFR calc non Af Amer: 53 mL/min/{1.73_m2} — ABNORMAL LOW (ref >59)
Glucose: 169 mg/dL — ABNORMAL HIGH (ref 65–99)
Potassium: 4 mmol/L (ref 3.5–5.2)
Sodium: 140 mmol/L (ref 134–144)

## 2020-01-16 NOTE — Patient Instructions (Signed)
Medication Instructions:  NO CHANGES *If you need a refill on your cardiac medications before your next appointment, please call your pharmacy*   Lab Work: TODAY BMET If you have labs (blood work) drawn today and your tests are completely normal, you will receive your results only by: Marland Kitchen MyChart Message (if you have MyChart) OR . A paper copy in the mail If you have any lab test that is abnormal or we need to change your treatment, we will call you to review the results.   Testing/Procedures: NONE   Follow-Up: At [facility], you and your health needs are our priority.  As part of our continuing mission to provide you with exceptional heart care, we have created designated Provider Care Teams.  These Care Teams include your primary Cardiologist (physician) and Advanced Practice Providers (APPs -  Physician Assistants and Nurse Practitioners) who all work together to provide you with the care you need, when you need it.  We recommend signing up for the patient portal called "MyChart".  Sign up information is provided on this After Visit Summary.  MyChart is used to connect with patients for Virtual Visits (Telemedicine).  Patients are able to view lab/test results, encounter notes, upcoming appointments, etc.  Non-urgent messages can be sent to your provider as well.   To learn more about what you can do with MyChart, go to [email or web address].    Your next appointment:   1 year(s)  The format for your next appointment:   In Person  Provider:   Mertie Moores, MD   Other Instructions

## 2020-05-09 ENCOUNTER — Ambulatory Visit: Payer: Medicare Other | Admitting: Cardiovascular Disease

## 2020-05-09 ENCOUNTER — Other Ambulatory Visit: Payer: Self-pay

## 2020-05-09 ENCOUNTER — Encounter: Payer: Self-pay | Admitting: Cardiovascular Disease

## 2020-05-09 VITALS — BP 128/78 | HR 74 | Ht 66.0 in | Wt 156.2 lb

## 2020-05-09 DIAGNOSIS — I5181 Takotsubo syndrome: Secondary | ICD-10-CM

## 2020-05-09 DIAGNOSIS — I5022 Chronic systolic (congestive) heart failure: Secondary | ICD-10-CM

## 2020-05-09 MED ORDER — SPIRONOLACTONE 25 MG PO TABS: 25.0000 mg | ORAL_TABLET | ORAL | 3 refills | Status: DC

## 2020-05-09 MED ORDER — NITROGLYCERIN 0.4 MG SL SUBL: 0.4000 mg | SUBLINGUAL_TABLET | SUBLINGUAL | 6 refills | Status: DC | PRN

## 2020-05-09 NOTE — Progress Notes (Signed)
Cardiology Office Note   Date:  05/09/2020   ID:  Kayla, Medina [birth year], MRN [id number]  PCP:  Kayla Redwood, MD  Cardiologist:   Kayla Moores, MD   Chief Complaint  Patient presents with  . Congestive Heart Failure  . Hyperlipidemia   1. Takotsubo Syndrome-  2.actue on chronic systolic CHF - EF 79-03% - , now 40-45  3. Hypertension- 4. Diabetes mellitus 5. LBBB    Kayla Medina is a 87 y.o. female who presents for f/u of her mild MI - Takotsubo syndrome. Feels weak, no CP, Breathing is ok. Has some DOE .   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     Kayla Medina is a 87 y.o. female who presents for follow up of her Takotsubo.  BP at home is ok.  A bit elevated here. 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   September 10, 2014: Kayla Medina EF has fallen ( unexpectedly) .  She was diagnosed with Takotsubo  syndrome and has been on a beta blocker and angiotensin blocker since that time. Her ejection fraction has fallen to 25-30%.  She was recently seen by Dr. Brigitte Medina and had some hypertension. Amlodipine was added to her medical regimen at that time.  September 25, 2014:  Kayla Medina is seen back today for follow up of her severe LV dysfunction She was started on valsartan ( instead of Losartan ) during her last visit. We increase her Coreg and DC'd the Amlodipine . Her BP has been better.  Breathing is better.   She had foot surgery 2 days ago.  Is getting along ok    October 30, 2014:   She is doing ok. No CP or dyspnea.   Nov. 28, 2016:  Kayla Medina is seen today for follow up of her Takotsubo syndrome and chronic Systolic CHF. Hx of HTN BP is up a bit today -  Ate some ham over Thanksgiving . She is short of breath at times.   Jan. 17, 2017: Has some dyspnea - particularly with exertion . Walking to the kitchen causes her to have dyspnea.  Is still eating lots of salt .   She craves salt.  Still eating lots of salty snacks.   July 07, 2015: Doing  well Staying fairly active Has some dyspnea.  Oct . 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  Jan. 23, 2018:  Kayla Medina is doing ok.  Her last echocardiogram 02/03/2016 shows slight improvement of her left ventricle systolic function.  Her ejection fraction is now 40-45% range.  Tolerating the meds well.    Feb. 27, 2018:  Kayla Medina is seen today for Entresto follow up  Seems to be tolerating the Entresto She stopped the Spironolactone - she had orthostasis.   August 24, 2016 Kayla Medina is doing well.  Recent echo shows stable EF - 40-45%.  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 4, 2018:  Doing well   No CP or dyspnea discussed going up on the [place].  She does not want to change any of her medications because she is doing so well.  September 20, 2017:   Kayla Medina  is seen today for follow-up of her congestive heart failure.  She is doing well on the current dose of Entresto. Still working - Systems analyst   Dec.  11, 2019:  Kayla Medina is seen today .  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  Breathing is good  No CP  Tolerating Entresto    June 14, 2019:  Kayla Medina  is seen today for follow-up of her congestive heart failure and hypertension.  She has been on maximum dose Entresto for the past year.  I last saw her in the office 2 years ago.  We did a telemedicine visit last year.   Her last echocardiogram was in May, 2018 which revealed an ejection fraction of 40 to 45% and grade 1 diastolic dysfunction. She did not tolerate high dose entresto,  Is on Entresto 49-51 BID and is tolerating it well    Oct. 27, 2021:  Kayla Medina is seen today for follow up of her CHF. Her EF has improved on Entresto and standard CHF therapy. Echo from April, 2021 showed EF of 50%.  She has grade 1 diastolic dysfunction. Has developed vertigo.   Is on meclizine.   Feb. 18. 2022   Past  Medical History:  Diagnosis Date  . Anal fissure   . Ataxia 06/01/2013  . Chest pain, unspecified   . Chronic systolic CHF (congestive heart failure) ([place]) 09/10/2014  . Diabetes mellitus without complication (Kayla Medina)   . Glaucoma    pt. states no-glaucoma 07/20/13  . Hypertension   . Obstructive thrombus 04/23/2014  . Orthostasis 06/01/2013  . Other and unspecified hyperlipidemia   . Palpitations   . Pneumonia   . PVC (premature ventricular contraction)   . Rosacea   . Shingles    hx  . Shortness of breath   . Subclinical hypothyroidism   . Takotsubo syndrome 05/10/2014    Past Surgical History:  Procedure Laterality Date  . APPENDECTOMY    . bilateral eye surgery/cataract repair  3/09  . BLADDER REPAIR    . BOWEL RESECTION    . LEFT HEART CATHETERIZATION WITH CORONARY ANGIOGRAM N/A 04/24/2014   Procedure: LEFT HEART CATHETERIZATION WITH CORONARY ANGIOGRAM;  Surgeon: Kayla Hampshire, MD;  Location: [facility] CATH LAB;  Service: Cardiovascular;  Laterality: N/A;  . TOTAL ABDOMINAL HYSTERECTOMY       Current Outpatient Medications  Medication Sig Dispense Refill  . acetaminophen (TYLENOL) 650 MG CR tablet Take 650-1,300 mg by mouth 2 (two) times daily as needed for pain.    Marland Kitchen ALPRAZolam (XANAX) 0.5 MG tablet Take 0.5 mg by mouth at bedtime.    Marland Kitchen aspirin EC 81 MG EC tablet Take 1 tablet (81 mg total) by mouth daily. 30 tablet 0  . atorvastatin (LIPITOR) 10 MG tablet Take 10 mg by mouth daily at 6 PM.     . Blood Glucose Monitoring Suppl (Kayla Medina) w/Device KIT 1 each by Other route as needed.    . carvedilol (COREG) 12.5 MG tablet TAKE 1 TABLET BY MOUTH TWICE DAILY WITH A MEAL 180 tablet 1  . desonide (DESOWEN) 0.05 % cream Apply 1 application topically 2 (two) times daily as needed (for itching/dry skin).    Marland Kitchen ENTRESTO 49-51 MG Take 1 tablet by mouth twice daily 60 tablet 8  . EYSUVIS 0.25 % SUSP Place 1 drop into both eyes in the morning, at noon, in the evening, and at  bedtime.    . meclizine (ANTIVERT) 25 MG tablet Take 1 tablet (25 mg total) by mouth 3 (three) times daily as needed for dizziness. 30 tablet 0  . metFORMIN (GLUCOPHAGE) 1000  MG tablet Take 1 tablet (1,000 mg total) by mouth 2 (two) times daily.    . metroNIDAZOLE (METROCREAM) 0.75 % cream Apply 1 application topically daily as needed (to all itchy sites- as directed).     Glory Rosebush VERIO test strip     . SYNTHROID 50 MCG tablet Take 50 mcg by mouth daily.    . SYSTANE ULTRA 0.4-0.3 % SOLN Place 2 drops into both eyes as needed.    . nitroGLYCERIN (NITROSTAT) 0.4 MG SL tablet Place 1 tablet (0.4 mg total) under the tongue every 5 (five) minutes as needed for chest pain. 25 tablet 6  . spironolactone (ALDACTONE) 25 MG tablet Take 1 tablet (25 mg total) by mouth every other day. 45 tablet 3   No current facility-administered medications for this visit.    Allergies:   Codeine, Januvia [sitagliptin], Other, Penicillins, and Sulfonamide derivatives    Social History:  The patient  reports that she has never smoked. She has never used smokeless tobacco. She reports that she does not drink alcohol and does not use drugs.   Family History:  The patient's family history includes Alcoholism in her father; Alzheimer's disease in her mother; Diabetes in her daughter, maternal grandmother, and paternal grandmother; Hypertension in her mother; Other in her sister; Prostate cancer in her brother; Seizures in her sister; Thyroid disease in her brother, daughter, mother, and son.    ROS:  As per current history .   Physical Exam: Blood pressure 128/78, Medina 74, height '5\' 6"'  (1.676 m), weight 156 lb 3.2 oz (70.9 kg), SpO2 97 %.  GEN:   Elderly female,  HEENT: Normal NECK: No JVD; No carotid bruits LYMPHATICS: No lymphadenopathy CARDIAC: RRR , no murmurs, rubs, gallops RESPIRATORY:  Clear to auscultation without rales, wheezing or rhonchi  ABDOMEN: Soft, non-tender, non-distended MUSCULOSKELETAL:   No edema; No deformity  SKIN: Warm and dry NEUROLOGIC:  Alert and oriented x 3   EKG:    Feb. 18, 2022    NSR at 74.  INc. LBBB   Recent Labs: 06/14/2019: ALT 12 01/16/2020: BUN 16; Creatinine, Ser 0.98; Potassium 4.0; Sodium 140    Lipid Panel    Component Value Date/Time   CHOL 138 06/14/2019 0843   TRIG 145 06/14/2019 0843   HDL 58 06/14/2019 0843   CHOLHDL 2.4 06/14/2019 0843   CHOLHDL 5.1 04/23/2014 0045   VLDL 59 (H) 04/23/2014 0045   LDLCALC 55 06/14/2019 0843      Wt Readings from Last 3 Encounters:  05/09/20 156 lb 3.2 oz (70.9 kg)  01/16/20 157 lb 6.4 oz (71.4 kg)  06/14/19 161 lb (73 kg)      Other studies Reviewed: Additional studies/ records that were reviewed today include: . Review of the above records demonstrates:    ASSESSMENT AND PLAN:  1.  Takotsubo syndrome:    Hx of takotsubo    2.  Chronic systolic congestive heart failure:     Doing well on entresto, spiro, coreg. 12.5 BID She has difficulty splitting the spironolactone.  Will change the dose to 25 mg QOD.  Cont    3. Essential hypertension:    BP is well controlled.   4.  Hyperlipidemia:     Lipid have been stable    Current medicines are reviewed at length with the patient today.  The patient does not have concerns regarding medicines.   Labs/ tests ordered today include:   Orders Placed This Encounter  Procedures  . EKG  12-Lead    Disposition:     Signed, Kayla Moores, MD  05/09/2020 4:58 PM    [facility] [place], [place], [place]  [address] Phone: [phone]; Fax: [phone]

## 2020-05-09 NOTE — Patient Instructions (Signed)
Medication Instructions:  Your physician has recommended you make the following change in your medication:  START taking spironolactone 25mg  every other day.   *If you need a refill on your cardiac medications before your next appointment, please call your pharmacy*   Lab Work: none If you have labs (blood work) drawn today and your tests are completely normal, you will receive your results only by: [place] MyChart Message (if you have MyChart) OR . A paper copy in the mail If you have any lab test that is abnormal or we need to change your treatment, we will call you to review the results.   Testing/Procedures: none   Follow-Up: At [facility], you and your health needs are our priority.  As part of our continuing mission to provide you with exceptional heart care, we have created designated Provider Care Teams.  These Care Teams include your primary Cardiologist (physician) and Advanced Practice Providers (APPs -  Physician Assistants and Nurse Practitioners) who all work together to provide you with the care you need, when you need it.  We recommend signing up for the patient portal called "MyChart".  Sign up information is provided on this After Visit Summary.  MyChart is used to connect with patients for Virtual Visits (Telemedicine).  Patients are able to view lab/test results, encounter notes, upcoming appointments, etc.  Non-urgent messages can be sent to your provider as well.   To learn more about what you can do with MyChart, go to [email or web address].    Your next appointment:   6 month(s)  The format for your next appointment:   In Person  Provider:   You may see Mertie Moores, MD or one of the following Advanced Practice Providers on your designated Care Team:    Richardson Dopp, PA-C  [place], [place]

## 2020-06-13 ENCOUNTER — Telehealth: Payer: Self-pay | Admitting: Cardiovascular Disease

## 2020-06-13 MED ORDER — CARVEDILOL 12.5 MG PO TABS: 12.5000 mg | ORAL_TABLET | Freq: Two times a day (BID) | ORAL | 1 refills | Status: DC

## 2020-06-13 NOTE — Telephone Encounter (Signed)
RN returned call to patient regarding medication question. Patient states she has not been taking the Carvedilol, and only taking the spironolactone. Patient reports feeling "nervous" and she thinks its related to the medication. Patient has not been monitoring her BP. RN clarified with Dr.Nahser and patient was supposed to continue taking her Carvedilol with the spironolactone. RN reviewed medications instructions with patient. Patient verbalized understanding. Patient states she needs a refill on the carvedilol. Medication sent to pharmacy. RN encouraged patient to monitor blood pressures and contact the office if readings are abnormal. Patient verbalized understanding.

## 2020-06-13 NOTE — Telephone Encounter (Signed)
Pt c/o medication issue:  1. Name of Medication: spironolactone (ALDACTONE) 25 MG tablet  2. How are you currently taking this medication (dosage and times per day)? 1 tablet (25 mg total) by mouth every other day  3. Are you having a reaction (difficulty breathing--STAT)? No   4. What is your medication issue?   Patient states this medication is causing her to shake all over. She states she is not in pain or having additional symptoms, but she would like to know if she can discontinue Spironolactone and go back on Carvedilol.  Please advise.

## 2020-06-13 NOTE — Telephone Encounter (Signed)
Kayla Medina has a long history of changing / stopping  her meds. We have reviewed this in detail in the past We will continue to review the importance of medication compliance with her at her next office visit

## 2020-06-17 ENCOUNTER — Other Ambulatory Visit: Payer: Self-pay | Admitting: Cardiovascular Disease

## 2020-06-22 ENCOUNTER — Telehealth: Payer: Self-pay | Admitting: Physician Assistant

## 2020-06-22 NOTE — Telephone Encounter (Signed)
Patient with h/o chronic systolic CHF (last echo in Apr 2021 showed EF has improved to 50%). Per patient's daughter, she ate some pizza and had signs of volume overload. She had orthopnea last night where she could lay down. Her symptom is concerning for pulmonary edema and volume overload. She is on entresto BID and spironolactone every other day. I recommend take a dose of spironolactone now and another dose around lunch time and then change spironolactone to daily. I instructed her daughter to take her to [facility] if her breathing is not getting better despite spironolactone by tonight. Either way, she will need a close outpatient follow up.  Will forward to our scheduler to arrange sooner visit.

## 2020-07-08 NOTE — Telephone Encounter (Signed)
Reached out to patient to schedule f/u visit. Patient states her issue has resolved and she does not feel like she needs to be seen at this time. Patient aggred to call and make another appointment if issue comes back.

## 2020-08-07 ENCOUNTER — Other Ambulatory Visit: Payer: Self-pay | Admitting: Internal Medicine

## 2020-08-07 DIAGNOSIS — R7989 Other specified abnormal findings of blood chemistry: Secondary | ICD-10-CM

## 2020-08-27 ENCOUNTER — Ambulatory Visit
Admission: RE | Admit: 2020-08-27 | Discharge: 2020-08-27 | Disposition: A | Discharge status: Home / Self Care | Payer: Medicare Other | Source: Ambulatory Visit | Attending: Internal Medicine | Admitting: Internal Medicine

## 2020-08-27 DIAGNOSIS — R7989 Other specified abnormal findings of blood chemistry: Secondary | ICD-10-CM

## 2020-09-03 ENCOUNTER — Other Ambulatory Visit: Payer: Medicare Other

## 2020-09-24 ENCOUNTER — Ambulatory Visit
Admission: RE | Admit: 2020-09-24 | Discharge: 2020-09-24 | Disposition: A | Discharge status: Home / Self Care | Payer: Medicare Other | Source: Ambulatory Visit | Attending: Internal Medicine | Admitting: Internal Medicine

## 2020-10-10 ENCOUNTER — Other Ambulatory Visit: Payer: Self-pay | Admitting: Internal Medicine

## 2020-10-10 DIAGNOSIS — K838 Other specified diseases of biliary tract: Secondary | ICD-10-CM

## 2020-10-24 ENCOUNTER — Other Ambulatory Visit: Payer: Self-pay

## 2020-10-24 ENCOUNTER — Ambulatory Visit: Payer: Medicare Other | Admitting: Cardiovascular Disease

## 2020-10-24 ENCOUNTER — Telehealth: Payer: Self-pay | Admitting: Cardiovascular Disease

## 2020-10-24 ENCOUNTER — Encounter: Payer: Self-pay | Admitting: Cardiovascular Disease

## 2020-10-24 VITALS — BP 126/74 | HR 57 | Ht 66.0 in | Wt 154.0 lb

## 2020-10-24 DIAGNOSIS — R0600 Dyspnea, unspecified: Secondary | ICD-10-CM | POA: Diagnosis not present

## 2020-10-24 DIAGNOSIS — R0609 Other forms of dyspnea: Secondary | ICD-10-CM

## 2020-10-24 DIAGNOSIS — I10 Essential (primary) hypertension: Secondary | ICD-10-CM | POA: Diagnosis not present

## 2020-10-24 DIAGNOSIS — I5181 Takotsubo syndrome: Secondary | ICD-10-CM

## 2020-10-24 DIAGNOSIS — I5020 Unspecified systolic (congestive) heart failure: Secondary | ICD-10-CM

## 2020-10-24 DIAGNOSIS — I5022 Chronic systolic (congestive) heart failure: Secondary | ICD-10-CM

## 2020-10-24 DIAGNOSIS — E782 Mixed hyperlipidemia: Secondary | ICD-10-CM

## 2020-10-24 MED ORDER — ONDANSETRON HCL 4 MG PO TABS: 4.0000 mg | ORAL_TABLET | Freq: Three times a day (TID) | ORAL | 0 refills | Status: DC | PRN

## 2020-10-24 NOTE — Telephone Encounter (Signed)
Reviewed with Dr. Acie Fredrickson.  He will see pt in office this afternoon.  The patient will plan to arrive at 1:00 pm today.  She is grateful for assistance.

## 2020-10-24 NOTE — Telephone Encounter (Signed)
Pt c/o Shortness Of Breath: STAT if SOB developed within the last 24 hours or pt is noticeably SOB on the phone  1. Are you currently SOB (can you hear that pt is SOB on the phone)? Yes daughter stating that she can hear on the phone  2. How long have you been experiencing SOB? A few days  3. Are you SOB when sitting or when up moving around? Both   4. Are you currently experiencing any other symptoms? Sick on the stomach

## 2020-10-24 NOTE — Telephone Encounter (Signed)
Patient reports has been experiencing SOB that started 2 weeks ago.  It is worse with activities.  At rest feels like she is panting.  She has no lower extremity swelling, abd does not feel tight.  Weights are stable.  No chest pain or tightness.  No cough.  She is tired all the time.  Works 5 days per week - 3 hrs per day.  Uses salt substitute.  The message also includes that she has sick stomach. Pt reports "that's from eating too many tomatoes, the SOB is scary."  Reviewed meds.  Med list is current, other than she is taking spironolactone 12.5 mg daily, not 25 mg every other day.  Pt scheduled w Dr. Acie Fredrickson 11/12/20.  Will review with Dr. Acie Fredrickson and will call pt with recommendations.

## 2020-10-24 NOTE — Patient Instructions (Addendum)
Medication Instructions:  Dellie Catholic to report your medications at [phone]  After phone call, patient reports she is not taking spironolactone due to hypotension. She was advised to d/c carvedilol   *If you need a refill on your cardiac medications before your next appointment, please call your pharmacy*   Lab Work: TODAY - BMET, CBC, pro-BNP If you have labs (blood work) drawn today and your tests are completely normal, you will receive your results only by: [place] (if you have MyChart) OR A paper copy in the mail If you have any lab test that is abnormal or we need to change your treatment, we will call you to review the results.   Testing/Procedures: Your physician has requested that you have an echocardiogram. Echocardiography is a painless test that uses sound waves to create images of your heart. It provides your doctor with information about the size and shape of your heart and how well your heart's chambers and valves are working. This procedure takes approximately one hour. There are no restrictions for this procedure.    Follow-Up: At [facility], you and your health needs are our priority.  As part of our continuing mission to provide you with exceptional heart care, we have created designated Provider Care Teams.  These Care Teams include your primary Cardiologist (physician) and Advanced Practice Providers (APPs -  Physician Assistants and Nurse Practitioners) who all work together to provide you with the care you need, when you need it.   Your next appointment:   3 week(s) on August 24 at 8:00 am  The format for your next appointment:   In Person  Provider:   Mertie Moores, MD

## 2020-10-24 NOTE — Progress Notes (Signed)
Cardiology Office Note   Date:  10/24/2020   ID:  Kayla, Medina 1932/11/20, MRN [id number]  PCP:  Ginger Organ., MD  Cardiologist:   Mertie Moores, MD   Chief Complaint  Patient presents with   Congestive Heart Failure   Hypertension   1. Takotsubo  Syndrome-  2.actue on chronic systolic CHF - EF 70-92% - , now 40-45  3. Hypertension- 4. Diabetes mellitus 5. LBBB      Kayla Medina is a 87 y.o. female who presents for f/u of her mild MI - Takotsubo syndrome. Feels weak, no CP,  Breathing is ok.  Has some DOE .   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     Kayla Medina is a 87 y.o. female who presents for follow up of her Takotsubo.  BP at home is ok.  A bit elevated here. 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   September 10, 2014: Kayla Medina EF has fallen ( unexpectedly) .  She was diagnosed with Takotsubo  syndrome and has been on a beta blocker and angiotensin blocker since that time. Her ejection fraction has fallen to 25-30%.  She was recently seen by Dr. Brigitte Pulse and had some hypertension. Amlodipine was added to her medical regimen at that time.  September 25, 2014:  Kayla Medina is seen back today for follow up of her severe LV dysfunction She was started on valsartan ( instead of Losartan ) during her last visit. We increase her Coreg and DC'd the Amlodipine . Her BP has been better.  Breathing is better.   She had foot surgery 2 days ago.  Is getting along ok    October 30, 2014:   She is doing ok. No CP or dyspnea.   Nov. 28, 2016:  Kayla Medina is seen today for follow up of her Takotsubo syndrome and chronic Systolic CHF. Hx of HTN BP is up a bit today -  Ate some ham over Thanksgiving . She is short of breath at times.   Jan. 17, 2017: Has some dyspnea - particularly with exertion . Walking to the kitchen causes her to have dyspnea.  Is still eating lots of salt .   She craves salt.  Still eating lots of salty snacks.   July 07, 2015: Doing well Staying fairly active Has some dyspnea.  Oct . 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  Jan. 23, 2018:  Kayla Medina is doing ok.  Her last echocardiogram 02/03/2016 shows slight improvement of her left ventricle systolic function.  Her ejection fraction is now 40-45% range.  Tolerating the meds well.    Feb. 27, 2018:  Kayla Medina is seen today for Entresto follow up  Seems to be tolerating the Entresto She stopped the Spironolactone - she had orthostasis.   August 24, 2016 Kayla Medina is doing well.  Recent echo shows stable EF - 40-45%.  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 4, 2018:  Doing well   No CP or dyspnea discussed going up on the [place].  She does not want to change any of her medications because she is doing so well.  September 20, 2017:   Kayla Medina  is seen today for follow-up of her congestive heart failure.  She is doing well on the current dose of Entresto. Still working -  private duty nurse   Dec. 11, 2019:  Kayla Medina is seen today .  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  Breathing is good  No CP  Tolerating Entresto    June 14, 2019:  Kayla Medina  is seen today for follow-up of her congestive heart failure and hypertension.  She has been on maximum dose Entresto for the past year.  I last saw her in the office 2 years ago.  We did a telemedicine visit last year.   Her last echocardiogram was in May, 2018 which revealed an ejection fraction of 40 to 45% and grade 1 diastolic dysfunction. She did not tolerate high dose entresto,  Is on Entresto 49-51 BID and is tolerating it well    Oct. 27, 2021:  Kayla Medina is seen today for follow up of her CHF. Her EF has improved on Entresto and standard CHF therapy. Echo from April, 2021 showed EF of 50%.  She has grade 1 diastolic dysfunction. Has developed vertigo.   Is on meclizine.   Feb. 18.  2022  Pt is stable. No new complaints    Aug. 5, 2022: Kayla Medina is seen for work in visit  More dyspnea.for the past several weeks .  Short of breath all the time Barely able to make it to the bathroom  Takes all her meds    Last echo in April , 2021 showed low normal EF 50 %. Lots of fatigue    Past Medical History:  Diagnosis Date   Anal fissure    Ataxia 06/01/2013   Chest pain, unspecified    Chronic systolic CHF (congestive heart failure) ([place]) 09/10/2014   Diabetes mellitus without complication (HCC)    Glaucoma    pt. states no-glaucoma 07/20/13   Hypertension    Obstructive thrombus 04/23/2014   Orthostasis 06/01/2013   Other and unspecified hyperlipidemia    Palpitations    Pneumonia    PVC (premature ventricular contraction)    Rosacea    Shingles    hx   Shortness of breath    Subclinical hypothyroidism    Takotsubo syndrome 05/10/2014    Past Surgical History:  Procedure Laterality Date   APPENDECTOMY     bilateral eye surgery/cataract repair  3/09   BLADDER REPAIR     BOWEL RESECTION     LEFT HEART CATHETERIZATION WITH CORONARY ANGIOGRAM N/A 04/24/2014   Procedure: LEFT HEART CATHETERIZATION WITH CORONARY ANGIOGRAM;  Surgeon: Wellington Hampshire, MD;  Location: [facility]ATH [facility];  Service: Cardiovascular;  Laterality: N/A;   TOTAL ABDOMINAL HYSTERECTOMY       Current Outpatient Medications  Medication Sig Dispense Refill   acetaminophen (TYLENOL) 650 MG CR tablet Take 650-1,300 mg by mouth 2 (two) times daily as needed for pain.     ALPRAZolam (XANAX) 0.5 MG tablet Take 0.5 mg by mouth at bedtime.     aspirin EC 81 MG EC tablet Take 1 tablet (81 mg total) by mouth daily. 30 tablet 0   atorvastatin (LIPITOR) 10 MG tablet Take 10 mg by mouth daily at 6 PM.      Blood Glucose Monitoring Suppl ([place]) w/Device KIT 1 each by Other route as needed.     carvedilol (COREG) 12.5 MG tablet Take 1 tablet (12.5 mg total) by mouth 2 (two) times daily with a  meal. 180 tablet 1   desonide (DESOWEN) 0.05 % cream Apply 1 application topically 2 (two) times daily  as needed (for itching/dry skin).     ENTRESTO 49-51 MG Take 1 tablet by mouth twice daily 180 tablet 3   EYSUVIS 0.25 % SUSP Place 1 drop into both eyes in the morning, at noon, in the evening, and at bedtime.     meclizine (ANTIVERT) 25 MG tablet Take 1 tablet (25 mg total) by mouth 3 (three) times daily as needed for dizziness. 30 tablet 0   metFORMIN (GLUCOPHAGE) 1000 MG tablet Take 1 tablet (1,000 mg total) by mouth 2 (two) times daily.     metroNIDAZOLE (METROCREAM) 0.75 % cream Apply 1 application topically daily as needed (to all itchy sites- as directed).      nitroGLYCERIN (NITROSTAT) 0.4 MG SL tablet Place 1 tablet (0.4 mg total) under the tongue every 5 (five) minutes as needed for chest pain. 25 tablet 6   ONETOUCH VERIO test strip      spironolactone (ALDACTONE) 25 MG tablet Take 1 tablet (25 mg total) by mouth every other day. 45 tablet 3   SYNTHROID 50 MCG tablet Take 50 mcg by mouth daily.     SYSTANE ULTRA 0.4-0.3 % SOLN Place 2 drops into both eyes as needed.     No current facility-administered medications for this visit.    Allergies:   Codeine, Januvia [sitagliptin], Other, Penicillins, and Sulfonamide derivatives    Social History:  The patient  reports that she has never smoked. She has never used smokeless tobacco. She reports that she does not drink alcohol and does not use drugs.   Family History:  The patient's family history includes Alcoholism in her father; Alzheimer's disease in her mother; Diabetes in her daughter, maternal grandmother, and paternal grandmother; Hypertension in her mother; Other in her sister; Prostate cancer in her brother; Seizures in her sister; Thyroid disease in her brother, daughter, mother, and son.    ROS:  As per current history .   Physical Exam: Blood pressure 126/74, pulse (!) 57, height $RemoveBe'5\' 6"'[id number]$  (1.676 m), weight 154 lb (69.9  kg), SpO2 99 %.  GEN:  elderly female,   appears fatigued HEENT: Normal NECK: No JVD; No carotid bruits LYMPHATICS: No lymphadenopathy CARDIAC: RRR , no murmurs, rubs, gallops RESPIRATORY:  Clear to auscultation without rales, wheezing or rhonchi  ABDOMEN: Soft, non-tender, non-distended MUSCULOSKELETAL:  No edema; No deformity  SKIN: Warm and dry NEUROLOGIC:  Alert and oriented x 3    EKG:     aug. 5, 2022:   sinus brady at 57.  No ST/T abn  Recent Labs: 01/16/2020: BUN 16; Creatinine, Ser 0.98; Potassium 4.0; Sodium 140    Lipid Panel    Component Value Date/Time   CHOL 138 06/14/2019 0843   TRIG 145 06/14/2019 0843   HDL 58 06/14/2019 0843   CHOLHDL 2.4 06/14/2019 0843   CHOLHDL 5.1 04/23/2014 0045   VLDL 59 (H) 04/23/2014 0045   LDLCALC 55 06/14/2019 0843      Wt Readings from Last 3 Encounters:  10/24/20 154 lb (69.9 kg)  05/09/20 156 lb 3.2 oz (70.9 kg)  01/16/20 157 lb 6.4 oz (71.4 kg)      Other studies Reviewed: Additional studies/ records that were reviewed today include: . Review of the above records demonstrates:    ASSESSMENT AND PLAN:  1.  Takotsubo syndrome:     Hx of takotsubo. No recurrence   2.  Increase fatigue / dyspnea:   difficult to sort out at this point . She is not sure she is  taking the coreg.   HR is slow,  if she is on Coreg, I suspect this may be the issue. She will call back when she gets home     2.  Chronic systolic congestive heart failure:    EF was 50% at last echo  Cont entresto  We may need to hold her coreg as noted above      3. Essential hypertension:    BP is well controlled.   4.  Hyperlipidemia:      stable    Current medicines are reviewed at length with the patient today.  The patient does not have concerns regarding medicines.   Labs/ tests ordered today include:   No orders of the defined types were placed in this encounter.   Disposition:     Signed, Mertie Moores, MD  10/24/2020 1:08 PM     [facility] [address], [place], [place]  [address] Phone: [phone]; Fax: [phone]

## 2020-10-25 LAB — BASIC METABOLIC PANEL
BUN/Creatinine Ratio: 18 (ref 12–28)
BUN: 20 mg/dL (ref 8–27)
CO2: 22 mmol/L (ref 20–29)
Calcium: 9.6 mg/dL (ref 8.7–10.3)
Chloride: 105 mmol/L (ref 96–106)
Creatinine, Ser: 1.1 mg/dL — ABNORMAL HIGH (ref 0.57–1.00)
Glucose: 142 mg/dL — ABNORMAL HIGH (ref 65–99)
Potassium: 4.4 mmol/L (ref 3.5–5.2)
Sodium: 140 mmol/L (ref 134–144)
eGFR: 49 mL/min/{1.73_m2} — ABNORMAL LOW (ref >59)

## 2020-10-25 LAB — CBC
Hematocrit: 36.8 % (ref 34.0–46.6)
Hemoglobin: 12.2 g/dL (ref 11.1–15.9)
MCH: 30.8 pg (ref 26.6–33.0)
MCHC: 33.2 g/dL (ref 31.5–35.7)
MCV: 93 fL (ref 79–97)
Platelets: 263 10*3/uL (ref 150–450)
RBC: 3.96 x10E6/uL (ref 3.77–5.28)
RDW: 12.5 % (ref 11.7–15.4)
WBC: 8.8 10*3/uL (ref 3.4–10.8)

## 2020-10-25 LAB — PRO B NATRIURETIC PEPTIDE: NT-Pro BNP: 240 pg/mL (ref 0–738)

## 2020-11-05 ENCOUNTER — Other Ambulatory Visit: Payer: Self-pay

## 2020-11-05 ENCOUNTER — Ambulatory Visit
Admission: RE | Admit: 2020-11-05 | Discharge: 2020-11-05 | Disposition: A | Discharge status: Home / Self Care | Payer: Medicare Other | Source: Ambulatory Visit | Attending: Internal Medicine | Admitting: Internal Medicine

## 2020-11-05 DIAGNOSIS — K838 Other specified diseases of biliary tract: Secondary | ICD-10-CM

## 2020-11-05 MED ORDER — GADOBENATE DIMEGLUMINE 529 MG/ML IV SOLN
14.0000 mL | Freq: Once | INTRAVENOUS | Status: AC | PRN
  Administered 2020-11-05: 14 mL via INTRAVENOUS

## 2020-11-10 ENCOUNTER — Ambulatory Visit (HOSPITAL_COMMUNITY): Payer: Medicare Other | Attending: Internal Medicine

## 2020-11-10 ENCOUNTER — Other Ambulatory Visit: Payer: Self-pay

## 2020-11-10 DIAGNOSIS — I5181 Takotsubo syndrome: Secondary | ICD-10-CM | POA: Diagnosis present

## 2020-11-10 DIAGNOSIS — I10 Essential (primary) hypertension: Secondary | ICD-10-CM | POA: Insufficient documentation

## 2020-11-10 DIAGNOSIS — I5020 Unspecified systolic (congestive) heart failure: Secondary | ICD-10-CM

## 2020-11-10 DIAGNOSIS — R0600 Dyspnea, unspecified: Secondary | ICD-10-CM | POA: Diagnosis present

## 2020-11-10 DIAGNOSIS — R0609 Other forms of dyspnea: Secondary | ICD-10-CM

## 2020-11-10 DIAGNOSIS — E782 Mixed hyperlipidemia: Secondary | ICD-10-CM | POA: Insufficient documentation

## 2020-11-10 LAB — ECHOCARDIOGRAM COMPLETE
Area-P 1/2: 2.5 cm2
S' Lateral: 2.7 cm

## 2020-11-12 ENCOUNTER — Ambulatory Visit: Payer: Medicare Other | Admitting: Cardiovascular Disease

## 2020-11-12 ENCOUNTER — Other Ambulatory Visit: Payer: Self-pay

## 2020-11-12 ENCOUNTER — Encounter: Payer: Self-pay | Admitting: Cardiovascular Disease

## 2020-11-12 VITALS — BP 134/74 | HR 75 | Ht 66.0 in | Wt 153.6 lb

## 2020-11-12 DIAGNOSIS — R0600 Dyspnea, unspecified: Secondary | ICD-10-CM | POA: Diagnosis not present

## 2020-11-12 DIAGNOSIS — R0609 Other forms of dyspnea: Secondary | ICD-10-CM

## 2020-11-12 NOTE — Patient Instructions (Addendum)
Medication Instructions:  Your physician recommends that you continue on your current medications as directed. Please refer to the Current Medication list given to you today.  *If you need a refill on your cardiac medications before your next appointment, please call your pharmacy*   Testing/Procedures: Your provider has recommended that you have a VQ scan (pulmonary-perfusion scan).   Follow-Up: At [facility], you and your health needs are our priority.  As part of our continuing mission to provide you with exceptional heart care, we have created designated Provider Care Teams.  These Care Teams include your primary Cardiologist (physician) and Advanced Practice Providers (APPs -  Physician Assistants and Nurse Practitioners) who all work together to provide you with the care you need, when you need it.   Your next appointment:   6 month(s)  The format for your next appointment:   In Person  Provider:   You may see Mertie Moores, MD or one of the following Advanced Practice Providers on your designated Care Team:   Richardson Dopp, PA-C [place], [place]   Other Instructions Ventilation Perfusion Scan  A ventilation-perfusion scan is a procedure to look at airflow and blood flow in the lungs. It is most often done to look for a blood clot that may have traveled to the lungs (pulmonary embolus). During this scan, radioactive compounds are injected into the bloodstream and are also breathed in. The compounds are not harmful. They are given at very low doses and stay in the body for a short time. After the compounds are injectedand breathed in, a camera is used to scan the lungs. Tell a health care provider about: Any allergies you have. All medicines you are taking, including vitamins, herbs, eye drops, creams, and over-the-counter medicines. Any blood disorders you have. Any surgeries you have had. Any medical conditions you have. Whether you are pregnant or may be  pregnant. Whether or not you are breastfeeding. What are the risks? Generally, this is a safe procedure. However, problems may occur, including anallergic reaction to the radioactive compounds. What happens before the procedure? Do not use any products that contain nicotine or tobacco before the procedure. These products include cigarettes, chewing tobacco, and vaping devices, such as e-cigarettes. If you need help quitting, ask your health care provider. Ask your health care provider about changing or stopping your regular medicines. This is especially important if you are taking diabetes medicines or blood thinners. What happens during the procedure? An IV will be inserted into one of your veins. The IV will stay in place for the entire exam. A small amount of radioactive material will be injected into your bloodstream. Your lungs will be scanned with a camera. This camera will record the images. You will be asked to inhale a second radioactive compound. Take deep breaths as directed by the health care provider. Your lungs will be scanned again. Your IV will be removed. The procedure may vary among health care providers and hospitals. What can I expect after the procedure? You may go home unless your health care provider instructs you differently. You may continue with your normal activities and diet as instructed by your health care provider. It is up to you to get the results of your procedure. Ask your health care provider, or the department that is doing the procedure, when your results will be ready. Summary A ventilation-perfusion scan is a procedure to look at airflow and blood flow in the lungs. It is most often done to look for  blood clots that may have traveled to the lungs. The radioactive compounds used during the procedure are not harmful. Your lungs will be scanned with a camera. This camera will record the images. It is up to you to get the results of your procedure. Ask your  health care provider, or the department that is doing the procedure, when your results will be ready. This information is not intended to replace advice given to you by your health care provider. Make sure you discuss any questions you have with your healthcare provider. Document Revised: 02/08/2020 Document Reviewed: 02/08/2020 Elsevier Patient Education  2022 [employer].

## 2020-11-12 NOTE — Progress Notes (Signed)
Cardiology Office Note   Date:  11/12/2020   ID:  Kayla, [name] 20-Dec-1932, MRN [id number]  PCP:  Ginger Organ., MD  Cardiologist:   Mertie Moores, MD   Chief Complaint  Patient presents with   Shortness of Breath   Congestive Heart Failure   Hyperlipidemia   1. Takotsubo  Syndrome-  2.actue on chronic systolic CHF - EF 04-88% - , now 40-45  3. Hypertension- 4. Diabetes mellitus 5. LBBB      RAYNETTE Medina is a 87 y.o. female who presents for f/u of her mild MI - Takotsubo syndrome. Feels weak, no CP,  Breathing is ok.  Has some DOE .   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     NYASHIA Medina is a 87 y.o. female who presents for follow up of her Takotsubo.  BP at home is ok.  A bit elevated here. 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   September 10, 2014: Kayla Medina EF has fallen ( unexpectedly) .  She was diagnosed with Takotsubo  syndrome and has been on a beta blocker and angiotensin blocker since that time. Her ejection fraction has fallen to 25-30%.  She was recently seen by Dr. Brigitte Pulse and had some hypertension. Amlodipine was added to her medical regimen at that time.  September 25, 2014:  Kayla Medina is seen back today for follow up of her severe LV dysfunction She was started on valsartan ( instead of Losartan ) during her last visit. We increase her Coreg and DC'd the Amlodipine . Her BP has been better.  Breathing is better.   She had foot surgery 2 days ago.  Is getting along ok    October 30, 2014:   She is doing ok. No CP or dyspnea.   Nov. 28, 2016:  Kayla Medina is seen today for follow up of her Takotsubo syndrome and chronic Systolic CHF. Hx of HTN BP is up a bit today -  Ate some ham over Thanksgiving . She is short of breath at times.   Jan. 17, 2017: Has some dyspnea - particularly with exertion . Walking to the kitchen causes her to have dyspnea.  Is still eating lots of salt .   She craves salt.  Still eating lots of salty snacks.    July 07, 2015: Doing well Staying fairly active Has some dyspnea.  Oct . 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  Jan. 23, 2018:  Kayla Medina is doing ok.  Her last echocardiogram 02/03/2016 shows slight improvement of her left ventricle systolic function.  Her ejection fraction is now 40-45% range.  Tolerating the meds well.    Feb. 27, 2018:  Kayla Medina is seen today for Entresto follow up  Seems to be tolerating the Entresto She stopped the Spironolactone - she had orthostasis.   August 24, 2016 [name] is doing well.  Recent echo shows stable EF - 40-45%.  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 4, 2018:  Doing well   No CP or dyspnea discussed going up on the [place].  She does not want to change any of her medications because she is doing so well.  September 20, 2017:   Kayla Medina  is seen today for follow-up of her congestive heart failure.  She is doing well on the current  dose of Entresto. Still working - private duty nurse   Dec. 11, 2019:  Kayla Medina is seen today .  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  Breathing is good  No CP  Tolerating Entresto    June 14, 2019:  Kayla Medina  is seen today for follow-up of her congestive heart failure and hypertension.  She has been on maximum dose Entresto for the past year.  I last saw her in the office 2 years ago.  We did a telemedicine visit last year.   Her last echocardiogram was in May, 2018 which revealed an ejection fraction of 40 to 45% and grade 1 diastolic dysfunction. She did not tolerate high dose entresto,  Is on Entresto 49-51 BID and is tolerating it well    Oct. 27, 2021:  Kayla Medina is seen today for follow up of her CHF. Her EF has improved on Entresto and standard CHF therapy. Echo from April, 2021 showed EF of 50%.  She has grade 1 diastolic dysfunction. Has developed vertigo.   Is on meclizine.    Feb. 18. 2022  Pt is stable. No new complaints    Aug. 5, 2022: Kayla Medina is seen for work in visit  More dyspnea.for the past several weeks .  Short of breath all the time Barely able to make it to the bathroom  Takes all her meds    Last echo in April , 2021 showed low normal EF 50 %. Lots of fatigue    November 12, 2020: Kayla Medina is seen today for a follow-up visit.  She has a known history of congestive heart failure.  She presented several weeks ago with progressive shortness of breath and lots of fatigue.  Her heart rate was fairly slow. Echocardiogram showed continued improvement of her left ventricular systolic function.  Her last EF is 50 to 55%. She stopped her coreg Is on entresto and DM meds.  Has been found to have gastritis   Has some tenderness of her left breast - associated with a mass or fibrous tissue.    Past Medical History:  Diagnosis Date   Anal fissure    Ataxia 06/01/2013   Chest pain, unspecified    Chronic systolic CHF (congestive heart failure) ([facility]) 09/10/2014   Diabetes mellitus without complication (HCC)    Glaucoma    pt. states no-glaucoma 07/20/13   Hypertension    Obstructive thrombus 04/23/2014   Orthostasis 06/01/2013   Other and unspecified hyperlipidemia    Palpitations    Pneumonia    PVC (premature ventricular contraction)    Rosacea    Shingles    hx   Shortness of breath    Subclinical hypothyroidism    Takotsubo syndrome 05/10/2014    Past Surgical History:  Procedure Laterality Date   APPENDECTOMY     bilateral eye surgery/cataract repair  3/09   BLADDER REPAIR     BOWEL RESECTION     LEFT HEART CATHETERIZATION WITH CORONARY ANGIOGRAM N/A 04/24/2014   Procedure: LEFT HEART CATHETERIZATION WITH CORONARY ANGIOGRAM;  Surgeon: Wellington Hampshire, MD;  Location: [facility];  Service: Cardiovascular;  Laterality: N/A;   TOTAL ABDOMINAL HYSTERECTOMY       Current Outpatient Medications  Medication Sig Dispense Refill    acetaminophen (TYLENOL) 650 MG CR tablet Take 650-1,300 mg by mouth 2 (two) times daily as needed for pain.     ALPRAZolam (XANAX) 0.5 MG tablet Take 0.5 mg by mouth  at bedtime.     atorvastatin (LIPITOR) 10 MG tablet Take 10 mg by mouth daily at 6 PM.      Blood Glucose Monitoring Suppl ([place]) w/Device KIT 1 each by Other route as needed.     desonide (DESOWEN) 0.05 % cream Apply 1 application topically 2 (two) times daily as needed (for itching/dry skin).     ENTRESTO 49-51 MG Take 1 tablet by mouth twice daily 180 tablet 3   EYSUVIS 0.25 % SUSP Place 1 drop into both eyes in the morning, at noon, in the evening, and at bedtime.     meclizine (ANTIVERT) 25 MG tablet Take 1 tablet (25 mg total) by mouth 3 (three) times daily as needed for dizziness. 30 tablet 0   metFORMIN (GLUCOPHAGE) 1000 MG tablet Take 1 tablet (1,000 mg total) by mouth 2 (two) times daily.     metroNIDAZOLE (METROCREAM) 0.75 % cream Apply 1 application topically daily as needed (to all itchy sites- as directed).      nitroGLYCERIN (NITROSTAT) 0.4 MG SL tablet Place 1 tablet (0.4 mg total) under the tongue every 5 (five) minutes as needed for chest pain. 25 tablet 6   ondansetron (ZOFRAN) 4 MG tablet Take 1 tablet (4 mg total) by mouth every 8 (eight) hours as needed for nausea or vomiting. 20 tablet 0   ONETOUCH VERIO test strip      pantoprazole (PROTONIX) 40 MG tablet Take 40 mg by mouth 2 (two) times daily.     SYNTHROID 50 MCG tablet Take 50 mcg by mouth daily.     SYSTANE ULTRA 0.4-0.3 % SOLN Place 2 drops into both eyes as needed.     aspirin EC 81 MG EC tablet Take 1 tablet (81 mg total) by mouth daily. (Patient not taking: Reported on 11/12/2020) 30 tablet 0   No current facility-administered medications for this visit.    Allergies:   Aspirin, Barbiturates, Codeine, Januvia [sitagliptin], Meperidine hcl, Other, Penicillins, Ramipril, and Sulfonamide derivatives    Social History:  The  patient  reports that she has never smoked. She has never used smokeless tobacco. She reports that she does not drink alcohol and does not use drugs.   Family History:  The patient's family history includes Alcoholism in her father; Alzheimer's disease in her mother; Diabetes in her daughter, maternal grandmother, and paternal grandmother; Hypertension in her mother; Other in her sister; Prostate cancer in her brother; Seizures in her sister; Thyroid disease in her brother, daughter, mother, and son.    ROS:  As per current history .   Physical Exam: Blood pressure 134/74, pulse 75, height '5\' 6"'  (1.676 m), weight 153 lb 9.6 oz (69.7 kg), SpO2 97 %.  GEN:  elderly female,  NAD  HEENT: Normal NECK: No JVD; No carotid bruits LYMPHATICS: No lymphadenopathy CARDIAC: RRR , no murmurs, rubs, gallops Left Breast:  has a painful fibrotic area along the medial aspect of her left breast.  Is not warm,  no erythemia.   Very tender .   Does not look like an abscess RESPIRATORY:  Clear to auscultation without rales, wheezing or rhonchi  ABDOMEN: Soft, non-tender, non-distended MUSCULOSKELETAL:  No edema; No deformity  SKIN: Warm and dry NEUROLOGIC:  Alert and oriented x 3     EKG:        Recent Labs: 10/24/2020: BUN 20; Creatinine, Ser 1.10; Hemoglobin 12.2; NT-Pro BNP 240; Platelets 263; Potassium 4.4; Sodium 140    Lipid Panel  Component Value Date/Time   CHOL 138 06/14/2019 0843   TRIG 145 06/14/2019 0843   HDL 58 06/14/2019 0843   CHOLHDL 2.4 06/14/2019 0843   CHOLHDL 5.1 04/23/2014 0045   VLDL 59 (H) 04/23/2014 0045   LDLCALC 55 06/14/2019 0843      Wt Readings from Last 3 Encounters:  11/12/20 153 lb 9.6 oz (69.7 kg)  10/24/20 154 lb (69.9 kg)  05/09/20 156 lb 3.2 oz (70.9 kg)      Other studies Reviewed: Additional studies/ records that were reviewed today include: . Review of the above records demonstrates:    ASSESSMENT AND PLAN:  1.  Takotsubo syndrome:     Hx  of takotsubo. No evidence of recurrent takotsubo    2.  Increase fatigue / dyspnea:     Echo shows improvement of her LV function.   I do not think we can explain her worsening dyspnea on cardiac dysfunction  Will get a VQ scan to look for chronic pulmonary emboli   2.  Chronic systolic congestive heart failure:        Her LV function has improved.   Cont entresto.   She was previously on coreg but this caused bradycardia.  We have discontinued coreg. She seems to feel better.    3. Essential hypertension:     BP is better.   4.  Hyperlipidemia:       Stable for now   Current medicines are reviewed at length with the patient today.  The patient does not have concerns regarding medicines.   Labs/ tests ordered today include:   Orders Placed This Encounter  Procedures   NM Pulmonary Perfusion   DG Chest 2 View     Disposition:     Signed, Mertie Moores, MD  11/12/2020 8:23 AM    [facility] [address], [place], [place]  [address] Phone: [phone]; Fax: [phone]

## 2020-11-13 ENCOUNTER — Telehealth: Payer: Self-pay | Admitting: *Deleted

## 2020-11-13 NOTE — Telephone Encounter (Signed)
Spoke with pt and explained to her about the medication difference.  Pt refuses to have test done.  She states they told her last time that the medication would bother her and it did.  She does not want to take a chance on trying it again.  Advised I will make Dr. Acie Fredrickson aware.

## 2020-11-13 NOTE — Telephone Encounter (Signed)
-----   Message from Thayer Headings, MD sent at 11/13/2020  4:26 PM EDT ----- Regarding: RE: Most likely that was the adenosine which can cause lots of side effects .  We are not using adenosine ----- Message ----- From: Loren Racer, RN Sent: 11/13/2020   2:37 PM EDT To: Thayer Headings, MD  I just spoke with her and she said prior to ever seeing you in the past, she had another doctor order a Lexiscan on her and she had a bad reaction to the tracer.  Stated she did not want to do this test.  I told her I would make you aware.  Kayla Medina  ----- Message ----- From: Thayer Headings, MD Sent: 11/13/2020  11:39 AM EDT To: Loren Racer, RN  Kayla Medina needs to have a lexiscan myoview in the next several weeks. She has a VQ scan on Aug. 31 and the myoview will need to be 4 + days after ( Sept 4 or later ) so that the tracers do not interfere.  Will you call her and tell her I thought that this test would be useful also - we did not discuss it yesterday while she was here.  Thanks  [employer]

## 2020-11-19 ENCOUNTER — Other Ambulatory Visit: Payer: Self-pay

## 2020-11-19 ENCOUNTER — Encounter (HOSPITAL_COMMUNITY): Payer: Self-pay

## 2020-11-19 ENCOUNTER — Emergency Department (HOSPITAL_COMMUNITY): Payer: Medicare Other

## 2020-11-19 ENCOUNTER — Inpatient Hospital Stay (HOSPITAL_COMMUNITY)
Admission: EM | Admit: 2020-11-19 | Discharge: 2020-11-23 | DRG: 178 | Disposition: A | Discharge status: Home Health | Payer: Medicare Other | Attending: Internal Medicine | Admitting: Internal Medicine

## 2020-11-19 ENCOUNTER — Ambulatory Visit (HOSPITAL_COMMUNITY): Payer: Medicare Other

## 2020-11-19 ENCOUNTER — Telehealth: Payer: Self-pay

## 2020-11-19 DIAGNOSIS — K219 Gastro-esophageal reflux disease without esophagitis: Secondary | ICD-10-CM | POA: Diagnosis present

## 2020-11-19 DIAGNOSIS — E785 Hyperlipidemia, unspecified: Secondary | ICD-10-CM | POA: Diagnosis present

## 2020-11-19 DIAGNOSIS — J690 Pneumonitis due to inhalation of food and vomit: Secondary | ICD-10-CM | POA: Diagnosis not present

## 2020-11-19 DIAGNOSIS — E1122 Type 2 diabetes mellitus with diabetic chronic kidney disease: Secondary | ICD-10-CM | POA: Diagnosis present

## 2020-11-19 DIAGNOSIS — I447 Left bundle-branch block, unspecified: Secondary | ICD-10-CM | POA: Diagnosis present

## 2020-11-19 DIAGNOSIS — R918 Other nonspecific abnormal finding of lung field: Secondary | ICD-10-CM

## 2020-11-19 DIAGNOSIS — J9811 Atelectasis: Secondary | ICD-10-CM | POA: Diagnosis present

## 2020-11-19 DIAGNOSIS — K2970 Gastritis, unspecified, without bleeding: Secondary | ICD-10-CM | POA: Diagnosis present

## 2020-11-19 DIAGNOSIS — Z7984 Long term (current) use of oral hypoglycemic drugs: Secondary | ICD-10-CM

## 2020-11-19 DIAGNOSIS — Z66 Do not resuscitate: Secondary | ICD-10-CM | POA: Diagnosis present

## 2020-11-19 DIAGNOSIS — Z8249 Family history of ischemic heart disease and other diseases of the circulatory system: Secondary | ICD-10-CM

## 2020-11-19 DIAGNOSIS — Z8349 Family history of other endocrine, nutritional and metabolic diseases: Secondary | ICD-10-CM

## 2020-11-19 DIAGNOSIS — I10 Essential (primary) hypertension: Secondary | ICD-10-CM | POA: Diagnosis present

## 2020-11-19 DIAGNOSIS — K862 Cyst of pancreas: Secondary | ICD-10-CM | POA: Diagnosis present

## 2020-11-19 DIAGNOSIS — E038 Other specified hypothyroidism: Secondary | ICD-10-CM | POA: Diagnosis present

## 2020-11-19 DIAGNOSIS — R069 Unspecified abnormalities of breathing: Secondary | ICD-10-CM

## 2020-11-19 DIAGNOSIS — Z820 Family history of epilepsy and other diseases of the nervous system: Secondary | ICD-10-CM

## 2020-11-19 DIAGNOSIS — H9190 Unspecified hearing loss, unspecified ear: Secondary | ICD-10-CM | POA: Diagnosis present

## 2020-11-19 DIAGNOSIS — D638 Anemia in other chronic diseases classified elsewhere: Secondary | ICD-10-CM | POA: Diagnosis present

## 2020-11-19 DIAGNOSIS — R0600 Dyspnea, unspecified: Secondary | ICD-10-CM | POA: Diagnosis not present

## 2020-11-19 DIAGNOSIS — E876 Hypokalemia: Secondary | ICD-10-CM | POA: Diagnosis present

## 2020-11-19 DIAGNOSIS — Z20822 Contact with and (suspected) exposure to covid-19: Secondary | ICD-10-CM | POA: Diagnosis present

## 2020-11-19 DIAGNOSIS — Z79899 Other long term (current) drug therapy: Secondary | ICD-10-CM

## 2020-11-19 DIAGNOSIS — I130 Hypertensive heart and chronic kidney disease with heart failure and stage 1 through stage 4 chronic kidney disease, or unspecified chronic kidney disease: Secondary | ICD-10-CM | POA: Diagnosis present

## 2020-11-19 DIAGNOSIS — R700 Elevated erythrocyte sedimentation rate: Secondary | ICD-10-CM | POA: Diagnosis present

## 2020-11-19 DIAGNOSIS — H409 Unspecified glaucoma: Secondary | ICD-10-CM | POA: Diagnosis present

## 2020-11-19 DIAGNOSIS — E039 Hypothyroidism, unspecified: Secondary | ICD-10-CM | POA: Diagnosis present

## 2020-11-19 DIAGNOSIS — Z90710 Acquired absence of both cervix and uterus: Secondary | ICD-10-CM

## 2020-11-19 DIAGNOSIS — J302 Other seasonal allergic rhinitis: Secondary | ICD-10-CM | POA: Diagnosis present

## 2020-11-19 DIAGNOSIS — D649 Anemia, unspecified: Secondary | ICD-10-CM | POA: Diagnosis not present

## 2020-11-19 DIAGNOSIS — Z8042 Family history of malignant neoplasm of prostate: Secondary | ICD-10-CM

## 2020-11-19 DIAGNOSIS — Z885 Allergy status to narcotic agent status: Secondary | ICD-10-CM

## 2020-11-19 DIAGNOSIS — R0602 Shortness of breath: Secondary | ICD-10-CM

## 2020-11-19 DIAGNOSIS — R531 Weakness: Secondary | ICD-10-CM

## 2020-11-19 DIAGNOSIS — Z886 Allergy status to analgesic agent status: Secondary | ICD-10-CM

## 2020-11-19 DIAGNOSIS — E871 Hypo-osmolality and hyponatremia: Secondary | ICD-10-CM | POA: Diagnosis present

## 2020-11-19 DIAGNOSIS — R7982 Elevated C-reactive protein (CRP): Secondary | ICD-10-CM | POA: Diagnosis present

## 2020-11-19 DIAGNOSIS — E1165 Type 2 diabetes mellitus with hyperglycemia: Secondary | ICD-10-CM | POA: Diagnosis present

## 2020-11-19 DIAGNOSIS — N183 Chronic kidney disease, stage 3 unspecified: Secondary | ICD-10-CM | POA: Diagnosis present

## 2020-11-19 DIAGNOSIS — I5022 Chronic systolic (congestive) heart failure: Secondary | ICD-10-CM | POA: Diagnosis present

## 2020-11-19 DIAGNOSIS — E872 Acidosis: Secondary | ICD-10-CM | POA: Diagnosis not present

## 2020-11-19 DIAGNOSIS — Z880 Allergy status to penicillin: Secondary | ICD-10-CM

## 2020-11-19 DIAGNOSIS — R627 Adult failure to thrive: Secondary | ICD-10-CM | POA: Diagnosis present

## 2020-11-19 DIAGNOSIS — Z8679 Personal history of other diseases of the circulatory system: Secondary | ICD-10-CM

## 2020-11-19 DIAGNOSIS — R0609 Other forms of dyspnea: Secondary | ICD-10-CM

## 2020-11-19 DIAGNOSIS — N1831 Chronic kidney disease, stage 3a: Secondary | ICD-10-CM | POA: Diagnosis present

## 2020-11-19 DIAGNOSIS — Z882 Allergy status to sulfonamides status: Secondary | ICD-10-CM

## 2020-11-19 DIAGNOSIS — I493 Ventricular premature depolarization: Secondary | ICD-10-CM | POA: Diagnosis present

## 2020-11-19 DIAGNOSIS — T380X5A Adverse effect of glucocorticoids and synthetic analogues, initial encounter: Secondary | ICD-10-CM | POA: Diagnosis not present

## 2020-11-19 DIAGNOSIS — Z79890 Hormone replacement therapy (postmenopausal): Secondary | ICD-10-CM

## 2020-11-19 DIAGNOSIS — E119 Type 2 diabetes mellitus without complications: Secondary | ICD-10-CM

## 2020-11-19 DIAGNOSIS — Z888 Allergy status to other drugs, medicaments and biological substances status: Secondary | ICD-10-CM

## 2020-11-19 DIAGNOSIS — Z6824 Body mass index (BMI) 24.0-24.9, adult: Secondary | ICD-10-CM

## 2020-11-19 DIAGNOSIS — I5181 Takotsubo syndrome: Secondary | ICD-10-CM | POA: Diagnosis present

## 2020-11-19 DIAGNOSIS — Z833 Family history of diabetes mellitus: Secondary | ICD-10-CM

## 2020-11-19 DIAGNOSIS — Z7982 Long term (current) use of aspirin: Secondary | ICD-10-CM

## 2020-11-19 DIAGNOSIS — Z87891 Personal history of nicotine dependence: Secondary | ICD-10-CM

## 2020-11-19 LAB — URINALYSIS, ROUTINE W REFLEX MICROSCOPIC
Bilirubin Urine: NEGATIVE
Glucose, UA: 150 mg/dL — AB
Hgb urine dipstick: NEGATIVE
Ketones, ur: NEGATIVE mg/dL
Nitrite: NEGATIVE
Protein, ur: NEGATIVE mg/dL
Specific Gravity, Urine: 1.006 (ref 1.005–1.030)
pH: 5 (ref 5.0–8.0)

## 2020-11-19 LAB — BASIC METABOLIC PANEL
Anion gap: 10 (ref 5–15)
BUN: 12 mg/dL (ref 8–23)
CO2: 20 mmol/L — ABNORMAL LOW (ref 22–32)
Calcium: 8.6 mg/dL — ABNORMAL LOW (ref 8.9–10.3)
Chloride: 102 mmol/L (ref 98–111)
Creatinine, Ser: 1.21 mg/dL — ABNORMAL HIGH (ref 0.44–1.00)
GFR, Estimated: 44 mL/min — ABNORMAL LOW (ref >60)
Glucose, Bld: 312 mg/dL — ABNORMAL HIGH (ref 70–99)
Potassium: 3.5 mmol/L (ref 3.5–5.1)
Sodium: 132 mmol/L — ABNORMAL LOW (ref 135–145)

## 2020-11-19 LAB — RESP PANEL BY RT-PCR (FLU A&B, COVID) ARPGX2
Influenza A by PCR: NEGATIVE
Influenza B by PCR: NEGATIVE
SARS Coronavirus 2 by RT PCR: NEGATIVE

## 2020-11-19 LAB — CBC
HCT: 34.8 % — ABNORMAL LOW (ref 36.0–46.0)
Hemoglobin: 11.6 g/dL — ABNORMAL LOW (ref 12.0–15.0)
MCH: 30.7 pg (ref 26.0–34.0)
MCHC: 33.3 g/dL (ref 30.0–36.0)
MCV: 92.1 fL (ref 80.0–100.0)
Platelets: 322 10*3/uL (ref 150–400)
RBC: 3.78 MIL/uL — ABNORMAL LOW (ref 3.87–5.11)
RDW: 12.8 % (ref 11.5–15.5)
WBC: 10.2 10*3/uL (ref 4.0–10.5)
nRBC: 0 % (ref 0.0–0.2)

## 2020-11-19 LAB — TROPONIN I (HIGH SENSITIVITY)
Troponin I (High Sensitivity): 12 ng/L (ref <18)
Troponin I (High Sensitivity): 9 ng/L (ref <18)

## 2020-11-19 LAB — BRAIN NATRIURETIC PEPTIDE: B Natriuretic Peptide: 126.5 pg/mL — ABNORMAL HIGH (ref 0.0–100.0)

## 2020-11-19 LAB — HEMOGLOBIN A1C
Hgb A1c MFr Bld: 8.6 % — ABNORMAL HIGH (ref 4.8–5.6)
Mean Plasma Glucose: 200.12 mg/dL

## 2020-11-19 LAB — TSH: TSH: 0.297 u[IU]/mL — ABNORMAL LOW (ref 0.350–4.500)

## 2020-11-19 LAB — T4, FREE: Free T4: 1.42 ng/dL — ABNORMAL HIGH (ref 0.61–1.12)

## 2020-11-19 MED ORDER — IPRATROPIUM-ALBUTEROL 0.5-2.5 (3) MG/3ML IN SOLN
3.0000 mL | Freq: Once | RESPIRATORY_TRACT | Status: AC
  Administered 2020-11-19: 3 mL via RESPIRATORY_TRACT
  Filled 2020-11-19: qty 3

## 2020-11-19 MED ORDER — SODIUM CHLORIDE 0.9 % IV SOLN: INTRAVENOUS | Status: AC

## 2020-11-19 MED ORDER — ENOXAPARIN SODIUM 40 MG/0.4ML IJ SOSY
40.0000 mg | PREFILLED_SYRINGE | INTRAMUSCULAR | Status: DC
  Administered 2020-11-19 – 2020-11-22 (×4): 40 mg via SUBCUTANEOUS
  Filled 2020-11-19 (×4): qty 0.4

## 2020-11-19 MED ORDER — NITROGLYCERIN 0.4 MG SL SUBL: 0.4000 mg | SUBLINGUAL_TABLET | SUBLINGUAL | Status: DC | PRN

## 2020-11-19 MED ORDER — FUROSEMIDE 10 MG/ML IJ SOLN
60.0000 mg | Freq: Once | INTRAMUSCULAR | Status: AC
  Administered 2020-11-19: 60 mg via INTRAVENOUS
  Filled 2020-11-19: qty 6

## 2020-11-19 MED ORDER — SODIUM BICARBONATE 650 MG PO TABS
650.0000 mg | ORAL_TABLET | Freq: Two times a day (BID) | ORAL | Status: DC
  Administered 2020-11-19 – 2020-11-23 (×8): 650 mg via ORAL
  Filled 2020-11-19 (×7): qty 1

## 2020-11-19 MED ORDER — SACUBITRIL-VALSARTAN 49-51 MG PO TABS
1.0000 | ORAL_TABLET | Freq: Two times a day (BID) | ORAL | Status: DC
  Administered 2020-11-19 – 2020-11-23 (×8): 1 via ORAL
  Filled 2020-11-19 (×10): qty 1

## 2020-11-19 MED ORDER — ONDANSETRON HCL 4 MG PO TABS: 4.0000 mg | ORAL_TABLET | Freq: Four times a day (QID) | ORAL | Status: DC | PRN

## 2020-11-19 MED ORDER — ALPRAZOLAM 0.5 MG PO TABS
0.5000 mg | ORAL_TABLET | Freq: Every day | ORAL | Status: DC
  Administered 2020-11-19 – 2020-11-22 (×4): 0.5 mg via ORAL
  Filled 2020-11-19 (×4): qty 1

## 2020-11-19 MED ORDER — IPRATROPIUM-ALBUTEROL 0.5-2.5 (3) MG/3ML IN SOLN: 3.0000 mL | Freq: Four times a day (QID) | RESPIRATORY_TRACT | Status: DC | PRN

## 2020-11-19 MED ORDER — PANTOPRAZOLE SODIUM 40 MG PO TBEC
40.0000 mg | DELAYED_RELEASE_TABLET | Freq: Two times a day (BID) | ORAL | Status: DC
  Administered 2020-11-19 – 2020-11-23 (×8): 40 mg via ORAL
  Filled 2020-11-19 (×8): qty 1

## 2020-11-19 MED ORDER — LEVOTHYROXINE SODIUM 50 MCG PO TABS
50.0000 ug | ORAL_TABLET | Freq: Every day | ORAL | Status: DC
  Administered 2020-11-20: 50 ug via ORAL
  Filled 2020-11-19: qty 1

## 2020-11-19 MED ORDER — METRONIDAZOLE 0.75 % EX CREA: 1.0000 "application " | TOPICAL_CREAM | Freq: Every day | CUTANEOUS | Status: DC | PRN

## 2020-11-19 MED ORDER — ACETAMINOPHEN 325 MG PO TABS
650.0000 mg | ORAL_TABLET | Freq: Four times a day (QID) | ORAL | Status: DC | PRN
  Administered 2020-11-21: 650 mg via ORAL
  Filled 2020-11-19: qty 2

## 2020-11-19 MED ORDER — INSULIN ASPART 100 UNIT/ML IJ SOLN
0.0000 [IU] | Freq: Three times a day (TID) | INTRAMUSCULAR | Status: DC
  Administered 2020-11-20: 1 [IU] via SUBCUTANEOUS
  Administered 2020-11-20: 9 [IU] via SUBCUTANEOUS
  Administered 2020-11-20 – 2020-11-21 (×2): 3 [IU] via SUBCUTANEOUS
  Administered 2020-11-21: 2 [IU] via SUBCUTANEOUS
  Administered 2020-11-21: 3 [IU] via SUBCUTANEOUS
  Administered 2020-11-22: 5 [IU] via SUBCUTANEOUS

## 2020-11-19 MED ORDER — ATORVASTATIN CALCIUM 10 MG PO TABS
10.0000 mg | ORAL_TABLET | Freq: Every day | ORAL | Status: DC
  Administered 2020-11-20 – 2020-11-22 (×3): 10 mg via ORAL
  Filled 2020-11-19 (×3): qty 1

## 2020-11-19 MED ORDER — LOTEPREDNOL ETABONATE 0.25 % OP SUSP
1.0000 [drp] | Freq: Four times a day (QID) | OPHTHALMIC | Status: DC
  Administered 2020-11-20 – 2020-11-23 (×12): 1 [drp] via OPHTHALMIC

## 2020-11-19 MED ORDER — IOHEXOL 350 MG/ML SOLN
60.0000 mL | Freq: Once | INTRAVENOUS | Status: AC | PRN
  Administered 2020-11-19: 60 mL via INTRAVENOUS

## 2020-11-19 MED ORDER — METHYLPREDNISOLONE SODIUM SUCC 125 MG IJ SOLR
125.0000 mg | Freq: Once | INTRAMUSCULAR | Status: AC
  Administered 2020-11-19: 125 mg via INTRAVENOUS
  Filled 2020-11-19: qty 2

## 2020-11-19 MED ORDER — ACETAMINOPHEN 650 MG RE SUPP: 650.0000 mg | Freq: Four times a day (QID) | RECTAL | Status: DC | PRN

## 2020-11-19 MED ORDER — ONDANSETRON HCL 4 MG/2ML IJ SOLN: 4.0000 mg | Freq: Four times a day (QID) | INTRAMUSCULAR | Status: DC | PRN

## 2020-11-19 NOTE — ED Triage Notes (Signed)
Pt reports 3 weeks of generalized weakness, sob and no appetite. Pt took a home COVId test that was negative. Pt denies n/v/d.

## 2020-11-19 NOTE — Telephone Encounter (Signed)
-----   Message from Stephani Police, RN sent at 11/19/2020  1:27 PM EDT ----- Regarding: RE: pt is in the ED.. need to cancel her nuc med scan? Hi April, I looked at her ED notes.. I will keep checking in Epic to see what they do with her... I did see that she had a neg home covid test which is good... hate to cancel quite yet in case they send her home and say keep your stress test appt.. hopefully I can find out more as she progresses in the ED.  Theseus Birnie    ----- Message ----- From: Belva Chimes H Sent: 11/19/2020   1:18 PM EDT To: Loren Racer, RN, [facility] Triage Subject: pt is in the ED.. need to cancel her nuc med#

## 2020-11-19 NOTE — H&P (Addendum)
History and Physical    Kayla Medina QZR:[id number] DOB: [birth year] DOA: 11/19/2020  PCP: Ginger Organ., MD Consultants:  cardiologist: Dr. Cathie Olden  Patient coming from:  Home - lives with son and daughter in  law   Chief Complaint: shortness of breath   HPI: Kayla Medina is a 87 y.o. female with medical history significant of HTN, hypothyroidism, systolic CHF, takotsubo syndrome, T2DM, LBBB and HLD who presented with 3 week history of shortness of breath upon exertion. 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 Three weeks ago she started to feel short of breath. She went to see her cardiologist on 11/12/20 who thought she may be dehydrated and ordered a VQ scan. He also wanted to do a stress test, but she declined this. He also did an echo on her 11/10/20 that showed EF to 50-55%.  Her shortness of breath has worsened over the past 3 weeks and is mainly with exertion. She can hardly even stand up. She can walk only  hanging on to chairs and the wall and has to stop frequently to catch her breath. She has had a cough that is dry in nature. She has lost weight and denies any swelling in her legs. She has also had poor PO intake for 1 month. She states she just lost her appetite and is really not eating much of anything.   She does not smoke, had remote history of smoking over 60 years ago that was social. Denies any environmental exposure including asbestos, silica, coal, sand. She does have hx of allergies. No hx of asthma or COPD.   She denies any chest pain, occasional palpitations. No fever, chills, no known sick contacts. No N/V/D, no abdominal pain, no dysuria, no headache, no vision changes.    ED Course: vitals: temp: 99, bp: 154/78, HR: 99, RR: 15, oxygen: 96% RA Pertinent labs: sodium: 132, glucose: 312, creatinine: 1.21, bnp: 126.5, CXR: new trace bilateral pleural effusion and atelectasis, CTA: no PE. Given duoneb and steroids. We were asked to admit.   Review of Systems: As  per HPI; otherwise review of systems reviewed and negative.   Ambulatory Status:  Ambulates without assistance   Past Medical History:  Diagnosis Date   Anal fissure    Ataxia 06/01/2013   Chest pain, unspecified    Chronic systolic CHF (congestive heart failure) ([place]) 09/10/2014   Diabetes mellitus without complication (HCC)    Glaucoma    pt. states no-glaucoma 07/20/13   Hypertension    Obstructive thrombus 04/23/2014   Orthostasis 06/01/2013   Other and unspecified hyperlipidemia    Palpitations    Pneumonia    PVC (premature ventricular contraction)    Rosacea    Shingles    hx   Shortness of breath    Subclinical hypothyroidism    Takotsubo syndrome 05/10/2014    Past Surgical History:  Procedure Laterality Date   APPENDECTOMY     bilateral eye surgery/cataract repair  3/09   BLADDER REPAIR     BOWEL RESECTION     LEFT HEART CATHETERIZATION WITH CORONARY ANGIOGRAM N/A 04/24/2014   Procedure: LEFT HEART CATHETERIZATION WITH CORONARY ANGIOGRAM;  Surgeon: Wellington Hampshire, MD;  Location: [facility] CATH LAB;  Service: Cardiovascular;  Laterality: N/A;   TOTAL ABDOMINAL HYSTERECTOMY      Social History   Socioeconomic History   Marital status: Married    Spouse name: Not on file   Number of children: 6   Years  of education: Not on file   Highest education level: Not on file  Occupational History   Occupation: CNA  Tobacco Use   Smoking status: Never   Smokeless tobacco: Never  Vaping Use   Vaping Use: Never used  Substance and Sexual Activity   Alcohol use: No   Drug use: No   Sexual activity: Not on file  Other Topics Concern   Not on file  Social History Narrative   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 Divorced. Fulltime- CNA (elderly/alz pts)   Social Determinants of Radio broadcast assistant Strain: Not on file  Food Insecurity: Not on file  Transportation Needs: Not on file  Physical Activity: Not on file  Stress: Not on file  Social Connections: Not on  file  Intimate Partner Violence: Not on file    Allergies  Allergen Reactions   Aspirin Other (See Comments)   Barbiturates Other (See Comments)   Codeine Nausea And Vomiting   Januvia [Sitagliptin] Nausea And Vomiting   Meperidine Hcl Other (See Comments)   Other Nausea And Vomiting    *all narcotic meds*   Penicillins Hives    Has patient had a PCN reaction causing immediate rash, facial/tongue/throat swelling, SOB or lightheadedness with hypotension: Yes Has patient had a PCN reaction causing severe rash involving mucus membranes or skin necrosis: No Has patient had a PCN reaction that required hospitalization: No Has patient had a PCN reaction occurring within the last 10 years: No If all of the above answers are "NO", then may proceed with Cephalosporin use.   Ramipril Other (See Comments)   Sulfonamide Derivatives Hives    Family History  Problem Relation Age of Onset   Alzheimer's disease Mother    Hypertension Mother    Thyroid disease Mother    Alcoholism Father    Diabetes Paternal Grandmother        entire family   Diabetes Maternal Grandmother    Prostate cancer Brother    Seizures Sister    Diabetes Daughter        x 2   Thyroid disease Brother        x2    Thyroid disease Daughter    Thyroid disease Son    Other Sister        meningitis    Prior to Admission medications   Medication Sig Start Date End Date Taking? Authorizing Provider  acetaminophen (TYLENOL) 650 MG CR tablet Take 650-1,300 mg by mouth 2 (two) times daily as needed for pain.    [provider]  ALPRAZolam Duanne Moron) 0.5 MG tablet Take 0.5 mg by mouth at bedtime.    [provider]  aspirin EC 81 MG EC tablet Take 1 tablet (81 mg total) by mouth daily. Patient not taking: Reported on 11/12/2020 04/25/14   Elgergawy, Silver Huguenin, MD  atorvastatin (LIPITOR) 10 MG tablet Take 10 mg by mouth daily at 6 PM.     [provider]  Blood Glucose Monitoring Suppl ([place]) w/Device KIT 1 each by Other route as needed. 02/12/19   [provider]  desonide (DESOWEN) 0.05 % cream Apply 1 application topically 2 (two) times daily as needed (for itching/dry skin).    [provider]  ENTRESTO 49-51 MG Take 1 tablet by mouth twice daily 06/18/20   Nahser, Wonda Cheng, MD  EYSUVIS 0.25 % SUSP Place 1 drop into both eyes in the morning, at noon, in the evening, and at bedtime. 01/09/20  [provider]  meclizine (ANTIVERT) 25 MG tablet Take 1 tablet (25 mg total) by mouth 3 (three) times daily as needed for dizziness. 06/03/13   Shon Baton, MD  metFORMIN (GLUCOPHAGE) 1000 MG tablet Take 1 tablet (1,000 mg total) by mouth 2 (two) times daily. 04/26/14   Elgergawy, Silver Huguenin, MD  metroNIDAZOLE (METROCREAM) 0.75 % cream Apply 1 application topically daily as needed (to all itchy sites- as directed).  01/11/18   [provider]  nitroGLYCERIN (NITROSTAT) 0.4 MG SL tablet Place 1 tablet (0.4 mg total) under the tongue every 5 (five) minutes as needed for chest pain. 05/09/20   Nahser, Wonda Cheng, MD  ondansetron (ZOFRAN) 4 MG tablet Take 1 tablet (4 mg total) by mouth every 8 (eight) hours as needed for nausea or vomiting. 10/24/20   Nahser, Wonda Cheng, MD  [facility] VERIO test strip  01/08/18   [provider]  pantoprazole (PROTONIX) 40 MG tablet Take 40 mg by mouth 2 (two) times daily. 11/06/20   [provider]  SYNTHROID 50 MCG tablet Take 50 mcg by mouth daily. 05/12/16   [provider]  SYSTANE ULTRA 0.4-0.3 % SOLN Place 2 drops into both eyes as needed. 05/29/19   [provider]    Physical Exam: Vitals:   11/19/20 1615 11/19/20 1830 11/19/20 1915 11/19/20 1930  BP: (!) 136/51 126/63 (!) 124/49 (!) 147/63  Pulse: 80 80 85 85  Resp: (!) 22 19 (!) 26 (!) 21  Temp:      TempSrc:      SpO2: 93% 98% 96% 96%  Weight:      Height:         General:  Appears calm and comfortable and is in NAD, but  dyspneic with talking Eyes:  PERRL, EOMI, normal lids, iris ENT:  hard of hearing, lips & tongue, mmm; appropriate dentition Neck:  no LAD, masses or thyromegaly; no carotid bruits Cardiovascular:  RRR, no m/r/g. No LE edema.  Respiratory:   ? Very faint fibrotic sounds in RLL, but otherwise normal/clear exam. No crackles, rales or rhonchi.   Dyspneic with talking.  Abdomen:  soft, NT, ND, NABS Back:   normal alignment, no CVAT Skin:  no rash or induration seen on limited exam Musculoskeletal:  grossly normal tone BUE/BLE, good ROM, no bony abnormality Lower extremity:  No LE edema.  Limited foot exam with no ulcerations.  2+ distal pulses. Psychiatric:  grossly normal mood and affect, speech fluent and appropriate, AOx3 Neurologic:  CN 2-12 grossly intact, moves all extremities in coordinated fashion, sensation intact    Radiological Exams on Admission: Independently reviewed - see discussion in A/P where applicable  DG Chest 2 View  Result Date: 11/19/2020 CLINICAL DATA:  Chest x-ray dated February 06, 2018 EXAM: CHEST - 2 VIEW COMPARISON:  None. FINDINGS: Cardiac and mediastinal contours are within normal limits. Trace bilateral pleural effusions and bibasilar atelectasis.More focal nodular opacity is seen adjacent to the left diaphragm on lateral view. No evidence of pneumothorax. IMPRESSION: New trace bilateral pleural effusions and atelectasis. More focal nodular opacity is seen adjacent to the left diaphragm on lateral view, possibly an additional area of atelectasis. Recommend follow-up PA and lateral chest x-ray 4-6 weeks to ensure resolution. Electronically Signed   By: Yetta Glassman M.D.   On: 11/19/2020 12:30   CT Angio Chest PE W/Cm &/Or Wo Cm  Result Date: 11/19/2020 CLINICAL DATA:  PE suspected, high prob EXAM: CT ANGIOGRAPHY CHEST WITH CONTRAST TECHNIQUE:  Multidetector CT imaging of the chest was performed using the standard protocol during bolus administration of  intravenous contrast. Multiplanar CT image reconstructions and MIPs were obtained to evaluate the vascular anatomy. CONTRAST:  58m OMNIPAQUE IOHEXOL 350 MG/ML SOLN COMPARISON:  None. FINDINGS: Cardiovascular: Satisfactory opacification of the pulmonary arteries to the segmental level. No evidence of pulmonary embolism. The thoracic aorta is normal in caliber. Normal heart size. No pericardial effusion. Mediastinum/Nodes: No pathologically enlarged mediastinal, hilar, or axillary lymph nodes. Prominent AP window lymph node measures 7 mm, within normal limits. The thyroid gland appears normal. Lungs/Pleura: No pleural effusion. No pneumothorax. Subsegmental atelectasis in the dependent aspects of the lungs. No suspicious pulmonary nodules. Musculoskeletal: No aggressive osseous lesions. Upper abdomen: The visualized upper abdomen is unremarkable. Review of the MIP images confirms the above findings. IMPRESSION: No evidence of pulmonary embolism. Subsegmental atelectasis in the dependent aspects of the lungs without other acute findings in the chest. Electronically Signed   By: YAlbin FellingM.D.   On: 11/19/2020 14:50    EKG: Independently reviewed.  NSR with rate 98, LBBB; nonspecific ST changes with no evidence of acute ischemia. LBBB known, not new.    Labs on Admission: I have personally reviewed the available labs and imaging studies at the time of the admission.  Pertinent labs:   sodium: 132,  glucose: 312,  creatinine: 1.21,  bnp: 126.5,    Assessment/Plan Principal Problem:   Dyspnea on exertion 87year old female with progressively worsening dyspnea on exertion x3 weeks.  Can hardly stand up or ambulate.  -She is not hypoxic on room air, but during ambulation oxygen drops down to 90%. -At this point there is no obvious cardiac or pulmonary component, but she is clearly dyspneic with progressively worsening symtpoms. Delta troponin is pending. Continue telemetry. CTA negative for PE.  Declined stress test recently.  -have consulted pulmonology for further evaluation/recommendation -will continue duonebs prn, but hold on steroids as she has no wheezing or other indication for steroids. Will f/u on pulm recommendation and appreciate.  -Unsure if failure to thrive is contributing to overall decline as well.   Active Problems:   Chronic systolic CHF (congestive heart failure) ([place] -Seen recently by her cardiologist with echo on 8/22.  EF improved to 50 to 55% with a left ventricular low normal function. -She has no weight gain, she has no lower extremity swelling.  Chest x-ray shows trace pleural effusions, no cardiomegaly; however, I do not think she is in a CHF exacerbation -BNP mildly elevated at 126 but lower than it has been in the past. Pro BNP on 8/5 was 240.  -Continue  Entresto -I/O and daily weights -continue to follow her clinically -declined stress test with her cardiologist.     FTT (failure to thrive) in adult -She has had poor p.o. intake over the past month with associated weight loss and weakness.  She has had work-up with abdominal imaging as well as CTA with no acute findings. Last colonoscopy was in 2015 -Unsure if this could be contributing to her dyspnea on exertion -Ordered a nutrition consult as well as PT    Type 2 diabetes mellitus without complication (HCC) -holding metformin and putting her on sliding scale insulin -accuchecks per protocol -A1c pending    Takotsubo syndrome -Having no chest pain.  EKG stable. -Initial troponin is 7 with delta pending -Continue telemetry    HTN (hypertension) -Blood pressures have been well controlled Coreg recently stopped this month. -Continue EDelene Loll  Hypothyroidism -Checking TSH and free T4  -Continue home dose of Synthroid    Gastritis -Recent diagnosis by PCP over a month ago when she started on her decline in eating -Has had abdominal ultrasound as well as MRI of her abdomen with and  without contrast including MRCP with no acute findings.  Did have a dilated common bile duct measuring up to 9 mm but no evidence of choledocholithiasis.  -All cystic lesions of the pancreatic head and tail, likely intraductal papillary mucinous neoplasms.  Recommend repeat imaging in 2 years. -Continue PPI BID    CKD (chronic kidney disease) stage 3, GFR 30-59 ml/min (HCC) -Slight bump from her baseline or possibly new baseline -However in setting of 1 month of poor p.o. intake likely has some AKI component -Light IV fluid hydration 50cc x 8 hours  -Intake/output  -Monitor especially while on Entresto -Avoid nephrotoxic drugs    Hyponatremia -Likely secondary to poor p.o. intake/volume depletion  -Very light fluid hydration 50 cc x 8 hours in light of systolic CHF -Continue to monitor    Hyperlipidemia  -Continue statin  Focal nodular opacity seen adjacent  to the left diaphragm on lateral view. Not mentioned on CTA. Repeat 2v cxr in 4-6 weeks.    Body mass index is 24.21 kg/m.   Level of care: Telemetry Medical DVT prophylaxis:  Lovenox  Code Status:  DNR- confirmed with patient Family Communication: daughter present at bedside: Kayla Medina Disposition Plan:  The patient is from: home  Anticipated d/c is to: home   Requires inpatient hospitalization and is at significant risk of worsening, requires constant monitoring, assessment and MDM with specialists.   Patient is currently: acutely ill Consults called: pulmonology   Admission status:  observation    Orma Flaming MD [facility]   How to contact the [facility] Attending or Consulting provider [place] or covering provider during after hours [place], for this patient?  Check the care team in [facility] and look for a) attending/consulting [facility] provider listed and b) the [facility] team listed Log into [email or web address] and use [place]'s universal password to access. If you do not have the password, please contact the hospital  operator. Locate the [facility] provider you are looking for under [facility] and page to a number that you can be directly reached. If you still have difficulty reaching the provider, please page the [facility] (Director on Call) for the Hospitalists listed on amion for assistance.  -Sales executive used for formatting of this note.   11/19/2020, 8:01 PM

## 2020-11-19 NOTE — Consult Note (Signed)
NAME:  Kayla Medina, MRN:  [id number], DOB:  1932/08/03, LOS: 0 ADMISSION DATE:  11/19/2020, CONSULTATION DATE:  11/19/20 REFERRING MD:  Hansel Feinstein, CHIEF COMPLAINT:  DOE   History of Present Illness:  Ms. Gentzler is an 87 y/o woman with a history of HFrEF with recovered EF who presented to the ED for evaluation of worsening SOB. This began about 3 weeks ago and has progressively worsened over that time.  She is short of breath at rest, with talking, and especially with trying to move around.  She has never had symptoms like this before.  There is no personal or family history of lung disease.  She has been coughing frequently, nonproductive, no hemoptysis.  She is not wheezing.  She feels like her cough has been improved for a few hours after taking albuterol.  She has been having abdominal pain from pulled muscles from frequent coughing.  She has been evaluated recently by her cardiologist who stopped Coreg due to bradycardia and hypotension, but this did not significantly change her symptoms.  She recently had spironolactone stopped as well, and she remains on Entresto.  Due to severe reflux, her to twice daily by her gastroenterologist.  She noticed her reflux was especially bad after eating fruit and tomatoes.  She has not been sick at all this summer, no upper respiratory symptoms.  She has chronic allergies for which she takes an antihistamine over-the-counter.  She has not had postnasal drip.  No edema, abdominal distention, chest pain, blood in her stool, or fevers.  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  She is not on a home diuretic, but recently has been drinking more water due to being told that she was dehydrated.  During the last few weeks she has had severely reduced appetite and has lost 3 to 5 pounds from poor p.o. intake.  She has a remote history of social smoking, never daily use.  She is accompanied today by her daughter, who corroborates the history.  Pertinent  Medical History  HFrEF>  recovered EF, was due to Takotsubo CM GERD Seasonal allergies CKD   Significant Hospital Events: Including procedures, antibiotic start and stop dates in addition to other pertinent events   8/31 admitted  Interim History / Subjective:    Objective   Blood pressure (!) 147/63, pulse 85, temperature 99 F (37.2 C), temperature source Oral, resp. rate (!) 21, height _0  (1.676 m), weight 68 kg, SpO2 96 %.       No intake or output data in the 24 hours ending 11/19/20 2121 Filed Weights   11/19/20 1104  Weight: 68 kg    Examination: General: Elderly woman lying in bed no acute distress HENT: [place]/AT, eyes anicteric Lungs: Bibasilar rales, no wheezing.  Clear to auscultation anteriorly.  Resting tachypnea in the 20s, conversational dyspnea.  Lying mostly flat during the encounter.  On room air. Cardiovascular: S1-S2, regular rate and rhythm, occasional PVCs on telemetry, rise NSR. Abdomen: Soft, nontender, nondistended Extremities: No clubbing, cyanosis, or edema Neuro: Awake and alert, normal speech, answering questions appropriately moving all extremities spontaneously.  Derm Warm, dry, no rashes  CTA personally reviewed-symmetric bibasilar infiltrates, mild groundglass throughout both lungs  H/H 11.6/34.8 Bicarb 20, AG 10 BNP 126.5 Covid negative  Resolved Hospital Problem list     Assessment & Plan:  Shortness of breath-likely multifactorial.  Mild anemia and low bicarb are somewhat chronic for her, but certainly could be contributing.  Her CT scan of her  lungs is not normal.  I worry that she could have chronic aspiration related inflammation, potentially scarring due to severe chronic GERD.  With groundglass seen in both lungs, this could be related to very mild pulmonary edema, which could explain her response to albuterol. -Trial of Lasix tonight, reassess tomorrow to determine if her shortness of breath has at all improved.  Watch kidney function closely.   Clinically she does appear euvolemic, but BNP is mildly elevated, albeit consistent with previous levels. -Start bicarb supplements. This likely should be continued as an outpatient for her CKD with chronic metabolic acidosis. -Agree with high-dose PPI.  She is already doing the behavioral modifications to address reflux.  She has been eating minimally recently.  If she is having aspiration related pneumonitis, this could take several weeks to radiographically resolve. -If no response to Lasix, would consider assessing for ILD, but her radiographic findings are not suggestive of this at this time.  Will need PFTs as an outpatient.  PCCM will follow up tomorrow.  Best Practice (right click and "Reselect all SmartList Selections" daily)   Per primary  Labs   CBC: Recent Labs  Lab 11/19/20 1107  WBC 10.2  HGB 11.6*  HCT 34.8*  MCV 92.1  PLT 409    Basic Metabolic Panel: Recent Labs  Lab 11/19/20 1107  NA 132*  K 3.5  CL 102  CO2 20*  GLUCOSE 312*  BUN 12  CREATININE 1.21*  CALCIUM 8.6*   GFR: Estimated Creatinine Clearance: 31.2 mL/min (A) (by C-G formula based on SCr of 1.21 mg/dL (H)). Recent Labs  Lab 11/19/20 1107  WBC 10.2    Liver Function Tests: No results for input(s): AST, ALT, ALKPHOS, BILITOT, PROT, ALBUMIN in the last 168 hours. No results for input(s): LIPASE, AMYLASE in the last 168 hours. No results for input(s): AMMONIA in the last 168 hours.  ABG No results found for: PHART, PCO2ART, PO2ART, HCO3, TCO2, ACIDBASEDEF, O2SAT   Coagulation Profile: No results for input(s): INR, PROTIME in the last 168 hours.  Cardiac Enzymes: No results for input(s): CKTOTAL, CKMB, CKMBINDEX, TROPONINI in the last 168 hours.  HbA1C: Hgb A1c MFr Bld  Date/Time Value Ref Range Status  11/19/2020 07:28 PM 8.6 (H) 4.8 - 5.6 % Final    Comment:    (NOTE) Pre diabetes:          5.7%-6.4%  Diabetes:              >6.4%  Glycemic control for   <7.0% adults with  diabetes   04/23/2014 12:45 AM 8.2 (H) 4.8 - 5.6 % Final    Comment:    (NOTE)         Pre-diabetes: 5.7 - 6.4         Diabetes: >6.4         Glycemic control for adults with diabetes: <7.0     CBG: No results for input(s): GLUCAP in the last 168 hours.  Review of Systems:   Review of Systems  Constitutional:  Positive for malaise/fatigue and weight loss. Negative for chills and fever.  HENT:  Negative for congestion and sore throat.   Eyes: Negative.   Respiratory:  Positive for cough and shortness of breath. Negative for hemoptysis, sputum production and wheezing.   Cardiovascular:  Negative for chest pain and leg swelling.  Gastrointestinal:  Positive for heartburn. Negative for blood in stool, nausea and vomiting.  Genitourinary:  Positive for frequency. Negative for hematuria.  Musculoskeletal:  Negative  for joint pain and neck pain.  Skin:  Negative for rash.  Neurological:  Negative for focal weakness.  Endo/Heme/Allergies:  Positive for environmental allergies. Does not bruise/bleed easily.  Psychiatric/Behavioral: Negative.      Past Medical History:  She,  has a past medical history of Anal fissure, Ataxia (06/01/2013), Chest pain, unspecified, Chronic systolic CHF (congestive heart failure) ([place]) (09/10/2014), Diabetes mellitus without complication ([place]), Glaucoma, Hypertension, Obstructive thrombus (04/23/2014), Orthostasis (06/01/2013), Other and unspecified hyperlipidemia, Palpitations, Pneumonia, PVC (premature ventricular contraction), Rosacea, Shingles, Shortness of breath, Subclinical hypothyroidism, and Takotsubo syndrome (05/10/2014).   Surgical History:   Past Surgical History:  Procedure Laterality Date   APPENDECTOMY     bilateral eye surgery/cataract repair  3/09   BLADDER REPAIR     BOWEL RESECTION     LEFT HEART CATHETERIZATION WITH CORONARY ANGIOGRAM N/A 04/24/2014   Procedure: LEFT HEART CATHETERIZATION WITH CORONARY ANGIOGRAM;  Surgeon: Wellington Hampshire,  MD;  Location: [facility];  Service: Cardiovascular;  Laterality: N/A;   TOTAL ABDOMINAL HYSTERECTOMY       Social History:   reports that she has quit smoking. Her smoking use included cigarettes. She has a 0.05 pack-year smoking history. She has never used smokeless tobacco. She reports that she does not drink alcohol and does not use drugs.   Family History:  Her family history includes Alcoholism in her father; Alzheimer's disease in her mother; Diabetes in her daughter, maternal grandmother, and paternal grandmother; Hypertension in her mother; Other in her sister; Prostate cancer in her brother; Seizures in her sister; Thyroid disease in her brother, daughter, mother, and son.   Allergies Allergies  Allergen Reactions   Aspirin Other (See Comments)   Barbiturates Other (See Comments)   Codeine Nausea And Vomiting   Januvia [Sitagliptin] Nausea And Vomiting   Meperidine Hcl Other (See Comments)   Other Nausea And Vomiting    *all narcotic meds*   Penicillins Hives    Has patient had a PCN reaction causing immediate rash, facial/tongue/throat swelling, SOB or lightheadedness with hypotension: Yes Has patient had a PCN reaction causing severe rash involving mucus membranes or skin necrosis: No Has patient had a PCN reaction that required hospitalization: No Has patient had a PCN reaction occurring within the last 10 years: No If all of the above answers are "NO", then may proceed with Cephalosporin use.   Ramipril Other (See Comments)   Sulfonamide Derivatives Hives     Home Medications  Prior to Admission medications   Medication Sig Start Date End Date Taking? Authorizing Provider  acetaminophen (TYLENOL) 650 MG CR tablet Take 650-1,300 mg by mouth 2 (two) times daily as needed for pain.   Yes [provider]  albuterol (VENTOLIN HFA) 108 (90 Base) MCG/ACT inhaler Inhale 2 puffs into the lungs every 6 (six) hours as needed for shortness of breath or wheezing. 11/18/20   Yes [provider]  ALPRAZolam Duanne Moron) 0.5 MG tablet Take 0.5 mg by mouth at bedtime.   Yes [provider]  atorvastatin (LIPITOR) 10 MG tablet Take 10 mg by mouth at bedtime.   Yes [provider]  Blood Glucose Monitoring Suppl ([place]) w/Device KIT 1 each by Other route. Check blood sugar every morning. 02/12/19  Yes [provider]  cyclobenzaprine (FLEXERIL) 5 MG tablet Take 5 mg by mouth every 8 (eight) hours as needed for muscle spasms. 07/21/20  Yes [provider]  desonide (DESOWEN) 0.05 % cream Apply 1 application topically  2 (two) times daily as needed (for itching/dry skin).   Yes [provider]  ENTRESTO 49-51 MG Take 1 tablet by mouth twice daily Patient taking differently: Take 1 tablet by mouth 2 (two) times daily. 06/18/20  Yes Nahser, Wonda Cheng, MD  EYSUVIS 0.25 % SUSP Place 1 drop into both eyes in the morning, at noon, in the evening, and at bedtime. 01/09/20  Yes [provider]  meclizine (ANTIVERT) 25 MG tablet Take 1 tablet (25 mg total) by mouth 3 (three) times daily as needed for dizziness. 06/03/13  Yes Shon Baton, MD  metFORMIN (GLUCOPHAGE) 1000 MG tablet Take 1 tablet (1,000 mg total) by mouth 2 (two) times daily. 04/26/14  Yes Elgergawy, Silver Huguenin, MD  metroNIDAZOLE (METROCREAM) 0.75 % cream Apply 1 application topically daily as needed (to all itchy sites- as directed).  01/11/18  Yes [provider]  nitroGLYCERIN (NITROSTAT) 0.4 MG SL tablet Place 1 tablet (0.4 mg total) under the tongue every 5 (five) minutes as needed for chest pain. 05/09/20  Yes Nahser, Wonda Cheng, MD  ondansetron (ZOFRAN) 4 MG tablet Take 1 tablet (4 mg total) by mouth every 8 (eight) hours as needed for nausea or vomiting. 10/24/20  Yes Nahser, Wonda Cheng, MD  [facility] VERIO test strip 1 each by Other route. Check blood sugar every morning 01/08/18  Yes [provider]  pantoprazole (PROTONIX) 40 MG tablet  Take 40 mg by mouth 2 (two) times daily. 11/06/20  Yes [provider]  SYNTHROID 50 MCG tablet Take 50 mcg by mouth daily. 05/12/16  Yes [provider]  SYSTANE ULTRA 0.4-0.3 % SOLN Place 2 drops into both eyes at bedtime. 05/29/19  Yes [provider]  aspirin EC 81 MG EC tablet Take 1 tablet (81 mg total) by mouth daily. Patient not taking: No sig reported 04/25/14   Elgergawy, Silver Huguenin, MD     Critical care time:     Julian Hy, DO 11/19/20 9:21 PM [facility]

## 2020-11-19 NOTE — ED Provider Notes (Signed)
[facility] EMERGENCY DEPARTMENT Provider Note   CSN: [id number] Arrival date & time: 11/19/20  1050     History Chief Complaint  Patient presents with   Weakness   Shortness of Breath    Kayla Medina is a 87 y.o. female with h/o CHF, DM, HTN presenting to the ED for three weeks of gradually worsening fatigue, weakness, and shortness of breath. She was recently evaluated for this from her PCP and cardiologist where she had an ECHO that she reports was unchanged. She denies any chest pain, vomiting, or edema. She mentions that she does have a cough. Additionally, she was given an albuterol inhaler which she has been using 3-4x a day and is finding relief of her symptoms. Patient's daughter in the room mentions that she can't talk on the phone sometimes due to her shortness of breath. Her cardiologist had scheduled her for a VQ scan at a later time. She mentions discontinuing carvedilol and another unknown medication as instructed by her cardiologist. Denies any new medical problems or surgeries.    Weakness Associated symptoms: cough and shortness of breath   Associated symptoms: no abdominal pain, no chest pain, no dizziness, no dysuria, no fever and no vomiting   Shortness of Breath Associated symptoms: cough   Associated symptoms: no abdominal pain, no chest pain, no fever, no neck pain, no rash, no vomiting and no wheezing       Past Medical History:  Diagnosis Date   Anal fissure    Ataxia 06/01/2013   Chest pain, unspecified    Chronic systolic CHF (congestive heart failure) ([place]) 09/10/2014   Diabetes mellitus without complication ([place])    Glaucoma    pt. states no-glaucoma 07/20/13   Hypertension    Obstructive thrombus 04/23/2014   Orthostasis 06/01/2013   Other and unspecified hyperlipidemia    Palpitations    Pneumonia    PVC (premature ventricular contraction)    Rosacea    Shingles    hx   Shortness of breath    Subclinical hypothyroidism     Takotsubo syndrome 05/10/2014    Patient Active Problem List   Diagnosis Date Noted   Dyspnea on exertion 27/78/2423   Chronic systolic CHF (congestive heart failure) ([place]) 09/10/2014   HTN (hypertension) 06/12/2014   Takotsubo syndrome 05/10/2014   Chest pain 04/23/2014   Dyspnea 04/23/2014   Obstructive thrombus 04/23/2014   Orthostasis 06/01/2013   Ataxia 06/01/2013   DM 04/17/2009   Hyperlipidemia 04/17/2009   CHEST PAIN-UNSPECIFIED 04/17/2009   PALPITATIONS 04/14/2009   DYSPNEA 04/14/2009    Past Surgical History:  Procedure Laterality Date   APPENDECTOMY     bilateral eye surgery/cataract repair  3/09   BLADDER REPAIR     BOWEL RESECTION     LEFT HEART CATHETERIZATION WITH CORONARY ANGIOGRAM N/A 04/24/2014   Procedure: LEFT HEART CATHETERIZATION WITH CORONARY ANGIOGRAM;  Surgeon: Wellington Hampshire, MD;  Location: [facility] CATH LAB;  Service: Cardiovascular;  Laterality: N/A;   TOTAL ABDOMINAL HYSTERECTOMY       OB History   No obstetric history on file.     Family History  Problem Relation Age of Onset   Alzheimer's disease Mother    Hypertension Mother    Thyroid disease Mother    Alcoholism Father    Diabetes Paternal Grandmother        entire family   Diabetes Maternal Grandmother    Prostate cancer Brother    Seizures Sister    Diabetes  Daughter        x 2   Thyroid disease Brother        x2    Thyroid disease Daughter    Thyroid disease Son    Other Sister        meningitis    Social History   Tobacco Use   Smoking status: Never   Smokeless tobacco: Never  Vaping Use   Vaping Use: Never used  Substance Use Topics   Alcohol use: No   Drug use: No    Home Medications Prior to Admission medications   Medication Sig Start Date End Date Taking? Authorizing Provider  acetaminophen (TYLENOL) 650 MG CR tablet Take 650-1,300 mg by mouth 2 (two) times daily as needed for pain.    [provider]  ALPRAZolam Duanne Moron) 0.5 MG tablet Take 0.5 mg by  mouth at bedtime.    [provider]  aspirin EC 81 MG EC tablet Take 1 tablet (81 mg total) by mouth daily. Patient not taking: Reported on 11/12/2020 04/25/14   Elgergawy, Silver Huguenin, MD  atorvastatin (LIPITOR) 10 MG tablet Take 10 mg by mouth daily at 6 PM.     [provider]  Blood Glucose Monitoring Suppl ([place]) w/Device KIT 1 each by Other route as needed. 02/12/19   [provider]  desonide (DESOWEN) 0.05 % cream Apply 1 application topically 2 (two) times daily as needed (for itching/dry skin).    [provider]  ENTRESTO 49-51 MG Take 1 tablet by mouth twice daily 06/18/20   Nahser, Wonda Cheng, MD  EYSUVIS 0.25 % SUSP Place 1 drop into both eyes in the morning, at noon, in the evening, and at bedtime. 01/09/20   [provider]  meclizine (ANTIVERT) 25 MG tablet Take 1 tablet (25 mg total) by mouth 3 (three) times daily as needed for dizziness. 06/03/13   Shon Baton, MD  metFORMIN (GLUCOPHAGE) 1000 MG tablet Take 1 tablet (1,000 mg total) by mouth 2 (two) times daily. 04/26/14   Elgergawy, Silver Huguenin, MD  metroNIDAZOLE (METROCREAM) 0.75 % cream Apply 1 application topically daily as needed (to all itchy sites- as directed).  01/11/18   [provider]  nitroGLYCERIN (NITROSTAT) 0.4 MG SL tablet Place 1 tablet (0.4 mg total) under the tongue every 5 (five) minutes as needed for chest pain. 05/09/20   Nahser, Wonda Cheng, MD  ondansetron (ZOFRAN) 4 MG tablet Take 1 tablet (4 mg total) by mouth every 8 (eight) hours as needed for nausea or vomiting. 10/24/20   Nahser, Wonda Cheng, MD  [facility] VERIO test strip  01/08/18   [provider]  pantoprazole (PROTONIX) 40 MG tablet Take 40 mg by mouth 2 (two) times daily. 11/06/20   [provider]  SYNTHROID 50 MCG tablet Take 50 mcg by mouth daily. 05/12/16   [provider]  SYSTANE ULTRA 0.4-0.3 % SOLN Place 2 drops into both eyes as needed. 05/29/19   [provider]    Allergies    Aspirin, Barbiturates, Codeine, Januvia [sitagliptin], Meperidine hcl, Other, Penicillins, Ramipril, and Sulfonamide derivatives  Review of Systems   Review of Systems  Constitutional:  Positive for fatigue. Negative for fever.  HENT:  Negative for congestion and rhinorrhea.   Eyes:  Negative for discharge and redness.  Respiratory:  Positive for cough and shortness of breath. Negative for wheezing.   Cardiovascular:  Negative for chest pain, palpitations and leg swelling.  Gastrointestinal:  Negative for  abdominal pain and vomiting.  Genitourinary:  Negative for dysuria and hematuria.  Musculoskeletal:  Negative for joint swelling and neck pain.  Skin:  Negative for color change and rash.  Neurological:  Positive for weakness. Negative for dizziness and syncope.   Physical Exam Updated Vital Signs BP 126/63   Pulse 80   Temp 99 F (37.2 C) (Oral)   Resp 19   Ht '5\' 6"'  (1.676 m)   Wt 68 kg   SpO2 98%   BMI 24.21 kg/m   Physical Exam Vitals and nursing note reviewed.  Constitutional:      Appearance: Normal appearance.  HENT:     Head: Normocephalic and atraumatic.  Eyes:     General: No scleral icterus. Cardiovascular:     Rate and Rhythm: Normal rate and regular rhythm.  Pulmonary:     Effort: Pulmonary effort is normal. Tachypnea present. No accessory muscle usage or respiratory distress.     Comments: Diminished breath sounds Abdominal:     General: Abdomen is flat. Bowel sounds are normal.     Palpations: Abdomen is soft.  Musculoskeletal:        General: No deformity.     Cervical back: Normal range of motion.  Skin:    General: Skin is warm and dry.  Neurological:     General: No focal deficit present.     Mental Status: She is alert. Mental status is at baseline.    ED Results / Procedures / Treatments   Labs (all labs ordered are listed, but only abnormal results are displayed) Labs Reviewed  BASIC METABOLIC PANEL -  Abnormal; Notable for the following components:      Result Value   Sodium 132 (*)    CO2 20 (*)    Glucose, Bld 312 (*)    Creatinine, Ser 1.21 (*)    Calcium 8.6 (*)    GFR, Estimated 44 (*)    All other components within normal limits  CBC - Abnormal; Notable for the following components:   RBC 3.78 (*)    Hemoglobin 11.6 (*)    HCT 34.8 (*)    All other components within normal limits  URINALYSIS, ROUTINE W REFLEX MICROSCOPIC - Abnormal; Notable for the following components:   APPearance HAZY (*)    Glucose, UA 150 (*)    Leukocytes,Ua TRACE (*)    Bacteria, UA RARE (*)    Non Squamous Epithelial 0-5 (*)    All other components within normal limits  BRAIN NATRIURETIC PEPTIDE - Abnormal; Notable for the following components:   B Natriuretic Peptide 126.5 (*)    All other components within normal limits  RESP PANEL BY RT-PCR (FLU A&B, COVID) ARPGX2  TROPONIN I (HIGH SENSITIVITY)    EKG None  Radiology DG Chest 2 View  Result Date: 11/19/2020 CLINICAL DATA:  Chest x-ray dated February 06, 2018 EXAM: CHEST - 2 VIEW COMPARISON:  None. FINDINGS: Cardiac and mediastinal contours are within normal limits. Trace bilateral pleural effusions and bibasilar atelectasis.More focal nodular opacity is seen adjacent to the left diaphragm on lateral view. No evidence of pneumothorax. IMPRESSION: New trace bilateral pleural effusions and atelectasis. More focal nodular opacity is seen adjacent to the left diaphragm on lateral view, possibly an additional area of atelectasis. Recommend follow-up PA and lateral chest x-ray 4-6 weeks to ensure resolution. Electronically Signed   By: Yetta Glassman M.D.   On: 11/19/2020 12:30   CT Angio Chest PE W/Cm &/Or Wo Cm  Result  Date: 11/19/2020 CLINICAL DATA:  PE suspected, high prob EXAM: CT ANGIOGRAPHY CHEST WITH CONTRAST TECHNIQUE: Multidetector CT imaging of the chest was performed using the standard protocol during bolus administration of  intravenous contrast. Multiplanar CT image reconstructions and MIPs were obtained to evaluate the vascular anatomy. CONTRAST:  8m OMNIPAQUE IOHEXOL 350 MG/ML SOLN COMPARISON:  None. FINDINGS: Cardiovascular: Satisfactory opacification of the pulmonary arteries to the segmental level. No evidence of pulmonary embolism. The thoracic aorta is normal in caliber. Normal heart size. No pericardial effusion. Mediastinum/Nodes: No pathologically enlarged mediastinal, hilar, or axillary lymph nodes. Prominent AP window lymph node measures 7 mm, within normal limits. The thyroid gland appears normal. Lungs/Pleura: No pleural effusion. No pneumothorax. Subsegmental atelectasis in the dependent aspects of the lungs. No suspicious pulmonary nodules. Musculoskeletal: No aggressive osseous lesions. Upper abdomen: The visualized upper abdomen is unremarkable. Review of the MIP images confirms the above findings. IMPRESSION: No evidence of pulmonary embolism. Subsegmental atelectasis in the dependent aspects of the lungs without other acute findings in the chest. Electronically Signed   By: YAlbin FellingM.D.   On: 11/19/2020 14:50    Procedures Procedures   Medications Ordered in ED Medications  ipratropium-albuterol (DUONEB) 0.5-2.5 (3) MG/3ML nebulizer solution 3 mL (3 mLs Nebulization Given 11/19/20 1332)  methylPREDNISolone sodium succinate (SOLU-MEDROL) 125 mg/2 mL injection 125 mg (125 mg Intravenous Given 11/19/20 1330)  iohexol (OMNIPAQUE) 350 MG/ML injection 60 mL (60 mLs Intravenous Contrast Given 11/19/20 1421)  ipratropium-albuterol (DUONEB) 0.5-2.5 (3) MG/3ML nebulizer solution 3 mL (3 mLs Nebulization Given 11/19/20 1826)    ED Course  I have reviewed the triage vital signs and the nursing notes.  Pertinent labs & imaging results that were available during my care of the patient were reviewed by me and considered in my medical decision making (see chart for details).  TDora Simeoneis a 87y/o F with  h/o CHF, DM, and HTN presenting to the ED for SOB, fatigue,and weakness gradually worsening for the past 3 weeks. Differential includes CHF exacerbation, PE, pneumonia, UTI, bronchitis, COPD, COVID.   No leukocytosis. Hemoglobin stable. Rare bacteria with trace leukocytes. Patient not experiencing any urinary complaints.   BMP shows hyperglycemia at 312 with psuedohyponatremia. Cr slightly elevated from previous visit. BNP decreased from previous visits. CXR showed new trace bilateral pleural effusions and atelectasis. More focal nodular opacity is seen adjacent to the left diaphragm on lateral view and a repeat CXR in 4-6 weeks was recommended. Because her cardiologist had already prescribed her for a VQ scan to r/o a PE, a CT scan was ordered. CT showed no evidence of PE and subsegmental atelectasis.   With her shortness of breath and diminished lung sounds, a duoneb and solumedrol was ordered. She reports she was feeling better and found it easier to breath.   Ambulated in the department and sats dropped to 91%, but patient continued to have increased work of breathing and weakness requiring assistance. Another duoneb was ordered at this time.   Because of the patient's returning SOB and persistent weakness, the patient will be admitted. Discussed plan with patient and patient's daughter who are agreeable with plan.    Discussed patient with Dr. WRogers Blockerwith [facility] who will see and admit patient.    MDM Rules/Calculators/A&P   Final Clinical Impression(s) / ED Diagnoses Final diagnoses:  Shortness of breath  Generalized weakness    Rx / DC Orders ED Discharge Orders     None  Sherrell Puller, PA-C 11/19/20 1910    Daleen Bo, MD 11/20/20 [phone]

## 2020-11-19 NOTE — ED Notes (Signed)
Pt remained at 96% O2 sat when ambulating

## 2020-11-19 NOTE — ED Notes (Signed)
Report attempted x1, asked to call back

## 2020-11-19 NOTE — ED Notes (Signed)
Patient transported to X-ray 

## 2020-11-19 NOTE — Telephone Encounter (Signed)
Pt in the ED... need to continue to follow to let Nuc med if they should cancel her 11/21/20 appt.

## 2020-11-20 DIAGNOSIS — I5022 Chronic systolic (congestive) heart failure: Secondary | ICD-10-CM

## 2020-11-20 DIAGNOSIS — N1831 Chronic kidney disease, stage 3a: Secondary | ICD-10-CM

## 2020-11-20 DIAGNOSIS — R0600 Dyspnea, unspecified: Secondary | ICD-10-CM | POA: Diagnosis not present

## 2020-11-20 DIAGNOSIS — E119 Type 2 diabetes mellitus without complications: Secondary | ICD-10-CM

## 2020-11-20 DIAGNOSIS — I10 Essential (primary) hypertension: Secondary | ICD-10-CM

## 2020-11-20 LAB — COMPREHENSIVE METABOLIC PANEL
ALT: 29 U/L (ref 0–44)
AST: 26 U/L (ref 15–41)
Albumin: 2.7 g/dL — ABNORMAL LOW (ref 3.5–5.0)
Alkaline Phosphatase: 53 U/L (ref 38–126)
Anion gap: 12 (ref 5–15)
BUN: 16 mg/dL (ref 8–23)
CO2: 21 mmol/L — ABNORMAL LOW (ref 22–32)
Calcium: 8.5 mg/dL — ABNORMAL LOW (ref 8.9–10.3)
Chloride: 104 mmol/L (ref 98–111)
Creatinine, Ser: 1.16 mg/dL — ABNORMAL HIGH (ref 0.44–1.00)
GFR, Estimated: 46 mL/min — ABNORMAL LOW (ref >60)
Glucose, Bld: 403 mg/dL — ABNORMAL HIGH (ref 70–99)
Potassium: 3.3 mmol/L — ABNORMAL LOW (ref 3.5–5.1)
Sodium: 137 mmol/L (ref 135–145)
Total Bilirubin: 0.4 mg/dL (ref 0.3–1.2)
Total Protein: 6.7 g/dL (ref 6.5–8.1)

## 2020-11-20 LAB — PROCALCITONIN: Procalcitonin: 0.1 ng/mL

## 2020-11-20 LAB — CBC
HCT: 33.5 % — ABNORMAL LOW (ref 36.0–46.0)
Hemoglobin: 11.1 g/dL — ABNORMAL LOW (ref 12.0–15.0)
MCH: 30.2 pg (ref 26.0–34.0)
MCHC: 33.1 g/dL (ref 30.0–36.0)
MCV: 91 fL (ref 80.0–100.0)
Platelets: 313 10*3/uL (ref 150–400)
RBC: 3.68 MIL/uL — ABNORMAL LOW (ref 3.87–5.11)
RDW: 12.7 % (ref 11.5–15.5)
WBC: 7.6 10*3/uL (ref 4.0–10.5)
nRBC: 0 % (ref 0.0–0.2)

## 2020-11-20 LAB — SEDIMENTATION RATE: Sed Rate: 88 mm/hr — ABNORMAL HIGH (ref 0–22)

## 2020-11-20 LAB — C-REACTIVE PROTEIN: CRP: 9.2 mg/dL — ABNORMAL HIGH (ref <1.0)

## 2020-11-20 LAB — GLUCOSE, CAPILLARY
Glucose-Capillary: 356 mg/dL — ABNORMAL HIGH (ref 70–99)
Glucose-Capillary: 249 mg/dL — ABNORMAL HIGH (ref 70–99)
Glucose-Capillary: 179 mg/dL — ABNORMAL HIGH (ref 70–99)
Glucose-Capillary: 165 mg/dL — ABNORMAL HIGH (ref 70–99)

## 2020-11-20 LAB — BRAIN NATRIURETIC PEPTIDE: B Natriuretic Peptide: 231.1 pg/mL — ABNORMAL HIGH (ref 0.0–100.0)

## 2020-11-20 MED ORDER — GLUCERNA SHAKE PO LIQD
237.0000 mL | Freq: Three times a day (TID) | ORAL | Status: DC
Start: 2020-11-20 — End: 2020-11-23
  Administered 2020-11-20 – 2020-11-22 (×7): 237 mL via ORAL

## 2020-11-20 MED ORDER — GUAIFENESIN 100 MG/5ML PO SOLN
5.0000 mL | ORAL | Status: DC | PRN
  Administered 2020-11-21: 100 mg via ORAL
  Filled 2020-11-20: qty 5

## 2020-11-20 MED ORDER — FUROSEMIDE 10 MG/ML IJ SOLN
60.0000 mg | Freq: Once | INTRAMUSCULAR | Status: AC
  Administered 2020-11-20: 60 mg via INTRAVENOUS
  Filled 2020-11-20: qty 6

## 2020-11-20 MED ORDER — ADULT MULTIVITAMIN W/MINERALS CH
1.0000 | ORAL_TABLET | Freq: Every day | ORAL | Status: DC
  Administered 2020-11-20 – 2020-11-23 (×4): 1 via ORAL
  Filled 2020-11-20 (×4): qty 1

## 2020-11-20 MED ORDER — LEVOTHYROXINE SODIUM 75 MCG PO TABS
37.5000 ug | ORAL_TABLET | Freq: Every day | ORAL | Status: DC
  Administered 2020-11-21 – 2020-11-23 (×3): 37.5 ug via ORAL
  Filled 2020-11-20 (×3): qty 1

## 2020-11-20 MED ORDER — POTASSIUM CHLORIDE CRYS ER 20 MEQ PO TBCR
40.0000 meq | EXTENDED_RELEASE_TABLET | Freq: Once | ORAL | Status: AC
  Administered 2020-11-20: 40 meq via ORAL
  Filled 2020-11-20: qty 2

## 2020-11-20 NOTE — Telephone Encounter (Signed)
Appointment cancelled

## 2020-11-20 NOTE — Plan of Care (Signed)

## 2020-11-20 NOTE — Progress Notes (Signed)
[facility] PROGRESS NOTE   Kayla Medina [id number] DOB: Apr 14, 1932 DOA: 11/19/2020  PCP: Ginger Organ., MD  Brief History/Interval Summary:  87 y.o. female with medical history significant of HTN, hypothyroidism, systolic CHF, takotsubo syndrome, T2DM, LBBB and HLD who presented with 3 week history of shortness of breath upon exertion.  Her symptoms left her quite fatigued with inability to ambulate.  She apparently went to see her cardiologist who did an echocardiogram which showed normal systolic function.  She presented due to worsening symptoms.  CT scan was done which did not show any PE.  Pulmonology was consulted.  Patient was hospitalized for further management.  Consultants: Pulmonology  Procedures: None yet  Antibiotics: Anti-infectives (From admission, onward)    None       Subjective/Interval History: Patient continues to have difficulty breathing.  Denies any chest pain.  No nausea vomiting.  Continues to have a cough which is dry.  No improvement after she was given furosemide.    Assessment/Plan:  Dyspnea Any worse with exertion.  Etiology is unclear.  CT scan did not show anything obvious.  Symptoms thought to be multifactorial.  Chronic aspiration is a distinct possibility due to her severe GERD.  Pulmonary edema was also thought to be a possibility.  Echocardiogram done recently showed normal systolic function.  Pulmonology was consulted to assist with management.  Patient was given Lasix yesterday.  Patient did not notice a significant improvement with this.  Seen by pulmonology again today.  They do talk about continuing diuretics. She remains on PPI.  Chronic systolic CHF/Takotsubo syndrome Recently seen by her cardiologist and underwent echocardiogram on 10/2018 which showed normal systolic function.  No evidence for fluid overload currently.  Continue Entresto and home medications.  Diabetes mellitus type 2 without complications, with  hyperglycemia Does not take insulin at home.  Just on metformin.  Glucose level noted to be 356 this morning.  HbA1c is 8.6.  Continue SSI.  Monitor CBG trends.  May need to add basal insulin.  Essential hypertension Monitor blood pressures closely.  Hypothyroidism TSH noted to be low and free T4 is slightly higher.  Patient does not have any overt symptoms of hyperthyroidism.  We will lower the dose of her levothyroxine slightly.  May need to recheck thyroid function tests in 3 weeks.  Chronic kidney disease stage IIIa/hyponatremia/hypokalemia Renal function close to baseline.  Avoid nephrotoxic agents.  Monitor urine output.  Sodium level noted to be normal today.  Replace potassium  Anemia of chronic disease Hemoglobin is stable.  No evidence of overt blood loss.  History of gastritis/GERD Recently diagnosed by PCP about a month ago.  She had abdominal imaging studies including ultrasound and MRI and MRCP which did not show any acute findings.  Dilated CBD was noted but no choledocholithiasis was present.  Cystic lesions of the pancreas was noted.  Repeat imaging study was recommended in 2 years. Continue PPI.  Hyperlipidemia Continue statin.  Failure to thrive This is thought to be secondary to other pulmonary issues.  PT and OT.    DVT Prophylaxis: Lovenox Code Status: DNR Family Communication: Discussed with patient and her son Disposition Plan: Hopefully return home when improved  Status is: Observation  The patient will require care spanning > 2 midnights and should be moved to inpatient because: Ongoing diagnostic testing needed not appropriate for outpatient work up, IV treatments appropriate due to intensity of illness or inability to take PO, and Inpatient level of  care appropriate due to severity of illness  Dispo: The patient is from: Home              Anticipated d/c is to: Home              Patient currently is not medically stable to d/c.   Difficult to place  patient No       Medications: Scheduled:  ALPRAZolam  0.5 mg Oral QHS   atorvastatin  10 mg Oral q1800   enoxaparin (LOVENOX) injection  40 mg Subcutaneous Q24H   insulin aspart  0-9 Units Subcutaneous TID WC   levothyroxine  50 mcg Oral Daily   Loteprednol Etabonate  1 drop Both Eyes QID   pantoprazole  40 mg Oral BID   sacubitril-valsartan  1 tablet Oral BID   sodium bicarbonate  650 mg Oral BID   Continuous: [id number] **OR** acetaminophen, ipratropium-albuterol, metroNIDAZOLE, nitroGLYCERIN, ondansetron **OR** ondansetron (ZOFRAN) IV   Objective:  Vital Signs  Vitals:   11/19/20 2200 11/20/20 0000 11/20/20 0628 11/20/20 1030  BP:   (!) 107/41   Pulse: 80 79 74 77  Resp: (!) 30 (!) 22 (!) 21   Temp:   97.7 F (36.5 C)   TempSrc:   Oral   SpO2: 94% 90% 93% 94%  Weight:      Height:        Intake/Output Summary (Last 24 hours) at 11/20/2020 1223 Last data filed at 11/20/2020 0900 Gross per 24 hour  Intake 927.24 ml  Output 175 ml  Net 752.24 ml   Filed Weights   11/19/20 1104  Weight: 68 kg    General appearance: Awake alert.  In no distress Resp: Mildly tachypneic.  No use of accessory muscles.  Coarse breath sounds with few crackles at the bases.  No wheezing or rhonchi Cardio: S1-S2 is normal regular.  No S3-S4.  No rubs murmurs or bruit GI: Abdomen is soft.  Nontender nondistended.  Bowel sounds are present normal.  No masses organomegaly Extremities: No edema.   Neurologic: Alert and oriented x3.  No focal neurological deficits.    Lab Results:  Data Reviewed: I have personally reviewed following labs and imaging studies  CBC: Recent Labs  Lab 11/19/20 1107 11/20/20 0344  WBC 10.2 7.6  HGB 11.6* 11.1*  HCT 34.8* 33.5*  MCV 92.1 91.0  PLT 322 [id number]    Basic Metabolic Panel: Recent Labs  Lab 11/19/20 1107 11/20/20 0344  NA 132* 137  K 3.5 3.3*  CL 102 104  CO2 20* 21*  GLUCOSE 312* 403*  BUN 12 16  CREATININE 1.21* 1.16*   CALCIUM 8.6* 8.5*    GFR: Estimated Creatinine Clearance: 32.6 mL/min (A) (by C-G formula based on SCr of 1.16 mg/dL (H)).  Liver Function Tests: Recent Labs  Lab 11/20/20 0344  AST 26  ALT 29  ALKPHOS 53  BILITOT 0.4  PROT 6.7  ALBUMIN 2.7*     BNP (last 3 results) Recent Labs    10/24/20 1348  PROBNP 240    HbA1C: Recent Labs    11/19/20 1928  HGBA1C 8.6*    CBG: Recent Labs  Lab 11/20/20 0700 11/20/20 1208  GLUCAP 356* 249*     Thyroid Function Tests: Recent Labs    11/19/20 2140  TSH 0.297*  FREET4 1.42*     Recent Results (from the past 240 hour(s))  Resp Panel by RT-PCR (Flu A&B, Covid) Nasopharyngeal Swab     Status: None   Collection  Time: 11/19/20 11:09 AM   Specimen: Nasopharyngeal Swab; Nasopharyngeal(NP) swabs in vial transport medium  Result Value Ref Range Status   SARS Coronavirus 2 by RT PCR NEGATIVE NEGATIVE Final    Comment: (NOTE) SARS-CoV-2 target nucleic acids are NOT DETECTED.  The SARS-CoV-2 RNA is generally detectable in upper respiratory specimens during the acute phase of infection. The lowest concentration of SARS-CoV-2 viral copies this assay can detect is 138 copies/mL. A negative result does not preclude SARS-Cov-2 infection and should not be used as the sole basis for treatment or other patient management decisions. A negative result may occur with  improper specimen collection/handling, submission of specimen other than nasopharyngeal swab, presence of viral mutation(s) within the areas targeted by this assay, and inadequate number of viral copies(<138 copies/mL). A negative result must be combined with clinical observations, patient history, and epidemiological information. The expected result is Negative.  Fact Sheet for Patients:  EntrepreneurPulse.com.au  Fact Sheet for Healthcare Providers:  IncredibleEmployment.be  This test is no t yet approved or cleared by the  [place] FDA and  has been authorized for detection and/or diagnosis of SARS-CoV-2 by FDA under an Emergency Use Authorization (EUA). This EUA will remain  in effect (meaning this test can be used) for the duration of the COVID-19 declaration under Section 564(b)(1) of the Act, 21 U.S.C.section 360bbb-3(b)(1), unless the authorization is terminated  or revoked sooner.       Influenza A by PCR NEGATIVE NEGATIVE Final   Influenza B by PCR NEGATIVE NEGATIVE Final    Comment: (NOTE) The Xpert Xpress SARS-CoV-2/FLU/RSV plus assay is intended as an aid in the diagnosis of influenza from Nasopharyngeal swab specimens and should not be used as a sole basis for treatment. Nasal washings and aspirates are unacceptable for Xpert Xpress SARS-CoV-2/FLU/RSV testing.  Fact Sheet for Patients: EntrepreneurPulse.com.au  Fact Sheet for Healthcare Providers: IncredibleEmployment.be  This test is not yet approved or cleared by the [place] FDA and has been authorized for detection and/or diagnosis of SARS-CoV-2 by FDA under an Emergency Use Authorization (EUA). This EUA will remain in effect (meaning this test can be used) for the duration of the COVID-19 declaration under Section 564(b)(1) of the Act, 21 U.S.C. section 360bbb-3(b)(1), unless the authorization is terminated or revoked.  Performed at [facility], [place] [address] [place], [place] [address]       Radiology Studies: DG Chest 2 View  Result Date: 11/19/2020 CLINICAL DATA:  Chest x-ray dated February 06, 2018 EXAM: CHEST - 2 VIEW COMPARISON:  None. FINDINGS: Cardiac and mediastinal contours are within normal limits. Trace bilateral pleural effusions and bibasilar atelectasis.More focal nodular opacity is seen adjacent to the left diaphragm on lateral view. No evidence of pneumothorax. IMPRESSION: New trace bilateral pleural effusions and atelectasis. More focal nodular opacity is  seen adjacent to the left diaphragm on lateral view, possibly an additional area of atelectasis. Recommend follow-up PA and lateral chest x-ray 4-6 weeks to ensure resolution. Electronically Signed   By: Yetta Glassman M.D.   On: 11/19/2020 12:30   CT Angio Chest PE W/Cm &/Or Wo Cm  Result Date: 11/19/2020 CLINICAL DATA:  PE suspected, high prob EXAM: CT ANGIOGRAPHY CHEST WITH CONTRAST TECHNIQUE: Multidetector CT imaging of the chest was performed using the standard protocol during bolus administration of intravenous contrast. Multiplanar CT image reconstructions and MIPs were obtained to evaluate the vascular anatomy. CONTRAST:  26m OMNIPAQUE IOHEXOL 350 MG/ML SOLN COMPARISON:  None. FINDINGS: Cardiovascular: Satisfactory opacification  of the pulmonary arteries to the segmental level. No evidence of pulmonary embolism. The thoracic aorta is normal in caliber. Normal heart size. No pericardial effusion. Mediastinum/Nodes: No pathologically enlarged mediastinal, hilar, or axillary lymph nodes. Prominent AP window lymph node measures 7 mm, within normal limits. The thyroid gland appears normal. Lungs/Pleura: No pleural effusion. No pneumothorax. Subsegmental atelectasis in the dependent aspects of the lungs. No suspicious pulmonary nodules. Musculoskeletal: No aggressive osseous lesions. Upper abdomen: The visualized upper abdomen is unremarkable. Review of the MIP images confirms the above findings. IMPRESSION: No evidence of pulmonary embolism. Subsegmental atelectasis in the dependent aspects of the lungs without other acute findings in the chest. Electronically Signed   By: Albin Felling M.D.   On: 11/19/2020 14:50       LOS: 0 days   [facility] Pager on [email or web address]  11/20/2020, 12:23 PM

## 2020-11-20 NOTE — Progress Notes (Signed)
Initial Nutrition Assessment  DOCUMENTATION CODES:  Not applicable  INTERVENTION:  -Glucerna Shake po TID, each supplement provides 220 kcal and 10 grams of protein -MVI with minerals daily  NUTRITION DIAGNOSIS:  Inadequate oral intake related to decreased appetite as evidenced by per patient/family report.  GOAL:  Patient will meet greater than or equal to 90% of their needs  MONITOR:  PO intake, Supplement acceptance, Labs, Weight trends, I & O's  REASON FOR ASSESSMENT:  Consult Assessment of nutrition requirement/status, Poor PO  ASSESSMENT:  Pt with PMH significant for CHF, DM, HTN, and Takotsubo syndrome admitted with dyspnea on exertion.  Pt reports severely reduced appetite over the last few weeks with associated 3-5 pound weight loss from poor po intake. Reviewed weight history. No significant weight changes noted.   No PO intake documented. Will provide pt with oral nutrition supplements to provide additional kcals/protein.  Medications: lasix, SSI, protonix, sodium bicarbonate  Labs: K+ 3.3 (L), Cr 1.16 (H) CBGs 356  UOP: 2x unmeasured occurrences x24 hours I/O: +577.48m since admit  Diet Order:   Diet Order             Diet Carb Modified Fluid consistency: Thin; Room service appropriate? Yes  Diet effective now                  EDUCATION NEEDS:  No education needs have been identified at this time  Skin:  Skin Assessment: Reviewed RN Assessment  Last BM:  PTA  Height:  Ht Readings from Last 1 Encounters:  11/19/20 '5\' 6"'$  (1.676 m)   Weight:  Wt Readings from Last 6 Encounters:  11/19/20 68 kg  11/12/20 69.7 kg  10/24/20 69.9 kg  05/09/20 70.9 kg  01/16/20 71.4 kg  06/14/19 73 kg   BMI:  Body mass index is 24.21 kg/m.  Estimated Nutritional Needs:  Kcal:  1700-1900 Protein:  85-95 grams Fluid:  >1.7L/d    ALarkin Ina MS, RD, LDN (she/her/hers) RD pager number and weekend/on-call pager number located in [email or web address]

## 2020-11-20 NOTE — Evaluation (Signed)
Clinical/Bedside Swallow Evaluation Patient Details  Name: Kayla Medina MRN: [id number] Date of Birth: 1932-09-22  Today's Date: 11/20/2020 Time: SLP Start Time (ACUTE ONLY): 33 SLP Stop Time (ACUTE ONLY): 1120 SLP Time Calculation (min) (ACUTE ONLY): 29 min  Past Medical History:  Past Medical History:  Diagnosis Date   Anal fissure    Ataxia 06/01/2013   Chest pain, unspecified    Chronic systolic CHF (congestive heart failure) ([place]) 09/10/2014   Diabetes mellitus without complication (HCC)    Glaucoma    pt. states no-glaucoma 07/20/13   Hypertension    Obstructive thrombus 04/23/2014   Orthostasis 06/01/2013   Other and unspecified hyperlipidemia    Palpitations    Pneumonia    PVC (premature ventricular contraction)    Rosacea    Shingles    hx   Shortness of breath    Subclinical hypothyroidism    Takotsubo syndrome 05/10/2014   Past Surgical History:  Past Surgical History:  Procedure Laterality Date   APPENDECTOMY     bilateral eye surgery/cataract repair  3/09   BLADDER REPAIR     BOWEL RESECTION     LEFT HEART CATHETERIZATION WITH CORONARY ANGIOGRAM N/A 04/24/2014   Procedure: LEFT HEART CATHETERIZATION WITH CORONARY ANGIOGRAM;  Surgeon: Wellington Hampshire, MD;  Location: [facility];  Service: Cardiovascular;  Laterality: N/A;   TOTAL ABDOMINAL HYSTERECTOMY     HPI:  87 y.o. female with medical history significant of HTN, hypothyroidism, systolic CHF, takotsubo syndrome, T2DM, LBBB and HLD who presented with 3 week history of shortness of breath upon exertion. 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 Three weeks ago she started to feel short of breath. She went to see her cardiologist on 11/12/20 who thought she may be dehydrated and ordered a VQ scan. He also wanted to do a stress test, but she declined this. He also did an echo on her 11/10/20 that showed EF to 50-55%.  Her shortness of breath has worsened over the past 3 weeks and is mainly with exertion. She can  hardly even stand up. She can walk only  hanging on to chairs and the wall and has to stop frequently to catch her breath. She has had a cough that is dry in nature. She has lost weight and denies any swelling in her legs. She has also had poor PO intake for 1 month. She states she just lost her appetite and is really not eating much of anything. CXR on 11/19/20 indicated New trace bilateral pleural effusions and atelectasis. More focal nodular opacity is seen adjacent to the left diaphragm on lateral view, possibly an additional area of atelectasis. BSE generated.  Assessment / Plan / Recommendation Clinical Impression  Pt seen for clinical swallowing evaluation with normal oropharyngeal swallow noted during consumption of thin liquids via straw and solids.  Pt masticated bolus efficiently and exhibited a timely swallow without overt s/s of aspiration present during BSE.  Pt c/o globus sensation "occasionally" usually triggered by spicy foods or eating at an increased rate/overfilling esophagus.  Pt implementing compensatory esophageal strategies such as alternating solids/liquids and eating at a slower rate during meals.  Recommendations given for further esophageal precautions with pt in agreement and appreciative.  Recommend continuing carb-modified/heart healthy diet with thin liquids and esophageal precautions paired with general swallowing precautions in place.  Pt may benefit from GI consult if globus sensation continues and/or worsens during consumption of POs.  Thank you for this consult.  ST  will s/o at this time. SLP Visit Diagnosis: Dysphagia, unspecified (R13.10)    Aspiration Risk  Mild aspiration risk    Diet Recommendation   Regular consistency (heart healthy/carb modified)/thin liquids  Medication Administration: Whole meds with liquid    Other  Recommendations Recommended Consults: Consider esophageal assessment (prn if symptoms persist) Oral Care Recommendations: Patient  independent with oral care Other Recommendations: Other (Comment) (GI consult prn)   Follow up Recommendations None      Frequency and Duration            Prognosis Prognosis for Safe Diet Advancement: Good      Swallow Study   General Date of Onset: 11/19/20 HPI: 87 y.o. female with medical history significant of HTN, hypothyroidism, systolic CHF, takotsubo syndrome, T2DM, LBBB and HLD who presented with 3 week history of shortness of breath upon exertion. 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 Three weeks ago she started to feel short of breath. She went to see her cardiologist on 11/12/20 who thought she may be dehydrated and ordered a VQ scan. He also wanted to do a stress test, but she declined this. He also did an echo on her 11/10/20 that showed EF to 50-55%.  Her shortness of breath has worsened over the past 3 weeks and is mainly with exertion. She can hardly even stand up. She can walk only  hanging on to chairs and the wall and has to stop frequently to catch her breath. She has had a cough that is dry in nature. She has lost weight and denies any swelling in her legs. She has also had poor PO intake for 1 month. She states she just lost her appetite and is really not eating much of anything. Type of Study: Bedside Swallow Evaluation Previous Swallow Assessment: n/a Diet Prior to this Study: Regular;Thin liquids Temperature Spikes Noted: No Respiratory Status: Room air History of Recent Intubation: No Behavior/Cognition: Alert;Cooperative Oral Cavity Assessment: Within Functional Limits Oral Care Completed by SLP: Other (Comment) (recent completion by pt) Oral Cavity - Dentition: Adequate natural dentition Vision: Functional for self-feeding Self-Feeding Abilities: Able to feed self Patient Positioning: Upright in chair Baseline Vocal Quality: Other (comment) (min hoarseness; reports dry cough for last 3 weeks) Volitional Cough: Strong Volitional Swallow: Able to  elicit    Oral/Motor/Sensory Function Overall Oral Motor/Sensory Function: Within functional limits   Ice Chips Ice chips: Not tested   Thin Liquid Thin Liquid: Within functional limits Presentation: Straw;Self Fed    Nectar Thick Nectar Thick Liquid: Not tested   Honey Thick Honey Thick Liquid: Not tested   Puree Puree: Not tested   Solid     Solid: Within functional limits Presentation: Self Fed      Elvina Sidle, M.S., CCC-SLP 11/20/2020,11:29 AM

## 2020-11-20 NOTE — Progress Notes (Signed)
Inpatient Rehab Admissions Coordinator:   Per therapy recommendations, pt was screened for CIR candidacy by Shann Medal, PT, DPT.  Pt is observation status, with no clear diagnosis to support CIR admission at this time.  We can rescreen if patient becomes inpatient, but may not have the medical necessity for CIR.  No consult recommended for now.    Shann Medal, PT, DPT Admissions Coordinator [phone] 11/20/20  11:44 AM

## 2020-11-20 NOTE — Progress Notes (Signed)
NAME:  Kayla Medina, MRN:  [id number], DOB:  [birth year], LOS: 0 ADMISSION DATE:  11/19/2020, CONSULTATION DATE:  11/19/20 REFERRING MD:  Hansel Feinstein, CHIEF COMPLAINT:  DOE   History of Present Illness:  Ms. Kayla Medina is an 87 y/o woman with a history of HFrEF with recovered EF who presented to the ED for evaluation of worsening SOB. This began about 3 weeks ago and has progressively worsened over that time.  She is short of breath at rest, with talking, and especially with trying to move around.  She has never had symptoms like this before.  There is no personal or family history of lung disease.  She has been coughing frequently, nonproductive, no hemoptysis.  She is not wheezing.  She feels like her cough has been improved for a few hours after taking albuterol.  She has been having abdominal pain from pulled muscles from frequent coughing.  She has been evaluated recently by her cardiologist who stopped Coreg due to bradycardia and hypotension, but this did not significantly change her symptoms.  She recently had spironolactone stopped as well, and she remains on Entresto.  Due to severe reflux, her to twice daily by her gastroenterologist.  She noticed her reflux was especially bad after eating fruit and tomatoes.  She has not been sick at all this summer, no upper respiratory symptoms.  She has chronic allergies for which she takes an antihistamine over-the-counter.  She has not had postnasal drip.  No edema, abdominal distention, chest pain, blood in her stool, or fevers.  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  She is not on a home diuretic, but recently has been drinking more water due to being told that she was dehydrated.  During the last few weeks she has had severely reduced appetite and has lost 3 to 5 pounds from poor p.o. intake.  She has a remote history of social smoking, never daily use.  She is accompanied today by her daughter, who corroborates the history.  Pertinent  Medical History  HFrEF>  recovered EF, was due to Takotsubo CM GERD Seasonal allergies CKD   Significant Hospital Events: Including procedures, antibiotic start and stop dates in addition to other pertinent events   8/31 admitted  Interim History / Subjective:  Reports diuresing but is not documented.  Not been ambulated since she desaturates.  Objective   Blood pressure (!) 107/41, pulse 74, temperature 97.7 F (36.5 C), temperature source Oral, resp. rate (!) 21, height '5\' 6"'  (1.676 m), weight 68 kg, SpO2 93 %.        Intake/Output Summary (Last 24 hours) at 11/20/2020 0830 Last data filed at 11/20/2020 4580 Gross per 24 hour  Intake 577.24 ml  Output --  Net 577.24 ml   Filed Weights   11/19/20 1104  Weight: 68 kg    Examination: General: Obese female lying in bed in no acute distress at rest HEENT: No JVD or lymphadenopathy is appreciated Neuro: Hard of hearing but otherwise grossly intact CV: Heart sounds are regular regular rate and rhythm PULM: Breath sounds in the bases currently on room air with sats of 92%   GI: soft, bsx4 active  GU: Voiding multiple times turbinate not documented Extremities: warm/dry, negative edema  Skin: no rashes or lesions Lab Results  Component Value Date   CREATININE 1.16 (H) 11/20/2020   CREATININE 1.21 (H) 11/19/2020   CREATININE 1.10 (H) 10/24/2020   CREATININE 1.15 (H) 03/09/2016   CREATININE 1.05 (H) 11/28/2015  CREATININE 1.06 (H) 04/29/2015   No chest x-ray  Resolved Hospital Problem list     Assessment & Plan:  Increasing shortness of breath/dyspnea on exertion for approximately 2 to 3-week duration and worsening with time.  Most likely is multifactorial noted to be mildly anemic with a low bicarb.  She does report coughing with nonproductive and eating seems to initiate coughing paroxysms. She is having a trial of Lasix but no documented INO She had 1 dose of Solu-Medrol She has bronchodilators Suite speech evaluation for possible  aspiration Creatinine slightly elevated but she can tolerate another day of diuresis. She does not report being any better but not any worse has not been able to ambulate at this time. O2 saturations on room air Continue PPI PCCM will continue to follow She will need PFTs as an outpatient And follow-up radiographic studies   Best Practice (right click and "Reselect all SmartList Selections" daily)   Per primary  Labs   CBC: Recent Labs  Lab 11/19/20 1107 11/20/20 0344  WBC 10.2 7.6  HGB 11.6* 11.1*  HCT 34.8* 33.5*  MCV 92.1 91.0  PLT 322 313     Basic Metabolic Panel: Recent Labs  Lab 11/19/20 1107 11/20/20 0344  NA 132* 137  K 3.5 3.3*  CL 102 104  CO2 20* 21*  GLUCOSE 312* 403*  BUN 12 16  CREATININE 1.21* 1.16*  CALCIUM 8.6* 8.5*    GFR: Estimated Creatinine Clearance: 32.6 mL/min (A) (by C-G formula based on SCr of 1.16 mg/dL (H)). Recent Labs  Lab 11/19/20 1107 11/20/20 0344  WBC 10.2 7.6     Liver Function Tests: Recent Labs  Lab 11/20/20 0344  AST 26  ALT 29  ALKPHOS 53  BILITOT 0.4  PROT 6.7  ALBUMIN 2.7*   No results for input(s): LIPASE, AMYLASE in the last 168 hours. No results for input(s): AMMONIA in the last 168 hours.  ABG No results found for: PHART, PCO2ART, PO2ART, HCO3, TCO2, ACIDBASEDEF, O2SAT   Coagulation Profile: No results for input(s): INR, PROTIME in the last 168 hours.  Cardiac Enzymes: No results for input(s): CKTOTAL, CKMB, CKMBINDEX, TROPONINI in the last 168 hours.  HbA1C: Hgb A1c MFr Bld  Date/Time Value Ref Range Status  11/19/2020 07:28 PM 8.6 (H) 4.8 - 5.6 % Final    Comment:    (NOTE) Pre diabetes:          5.7%-6.4%  Diabetes:              >6.4%  Glycemic control for   <7.0% adults with diabetes   04/23/2014 12:45 AM 8.2 (H) 4.8 - 5.6 % Final    Comment:    (NOTE)         Pre-diabetes: 5.7 - 6.4         Diabetes: >6.4         Glycemic control for adults with diabetes: <7.0      CBG: Recent Labs  Lab 11/20/20 0700  GLUCAP 356*      Past Medical History:  She,  has a past medical history of Anal fissure, Ataxia (06/01/2013), Chest pain, unspecified, Chronic systolic CHF (congestive heart failure) ([place]) (09/10/2014), Diabetes mellitus without complication ([place]), Glaucoma, Hypertension, Obstructive thrombus (04/23/2014), Orthostasis (06/01/2013), Other and unspecified hyperlipidemia, Palpitations, Pneumonia, PVC (premature ventricular contraction), Rosacea, Shingles, Shortness of breath, Subclinical hypothyroidism, and Takotsubo syndrome (05/10/2014).   Surgical History:   Past Surgical History:  Procedure Laterality Date   APPENDECTOMY     bilateral  eye surgery/cataract repair  3/09   BLADDER REPAIR     BOWEL RESECTION     LEFT HEART CATHETERIZATION WITH CORONARY ANGIOGRAM N/A 04/24/2014   Procedure: LEFT HEART CATHETERIZATION WITH CORONARY ANGIOGRAM;  Surgeon: Wellington Hampshire, MD;  Location: [facility];  Service: Cardiovascular;  Laterality: N/A;   TOTAL ABDOMINAL HYSTERECTOMY       Social History:   reports that she has quit smoking. Her smoking use included cigarettes. She has a 0.05 pack-year smoking history. She has never used smokeless tobacco. She reports that she does not drink alcohol and does not use drugs.   Family History:  Her family history includes Alcoholism in her father; Alzheimer's disease in her mother; Diabetes in her daughter, maternal grandmother, and paternal grandmother; Hypertension in her mother; Other in her sister; Prostate cancer in her brother; Seizures in her sister; Thyroid disease in her brother, daughter, mother, and son.   Allergies Allergies  Allergen Reactions   Aspirin Other (See Comments)   Barbiturates Other (See Comments)   Codeine Nausea And Vomiting   Januvia [Sitagliptin] Nausea And Vomiting   Meperidine Hcl Other (See Comments)   Other Nausea And Vomiting    *all narcotic meds*   Penicillins Hives    Has  patient had a PCN reaction causing immediate rash, facial/tongue/throat swelling, SOB or lightheadedness with hypotension: Yes Has patient had a PCN reaction causing severe rash involving mucus membranes or skin necrosis: No Has patient had a PCN reaction that required hospitalization: No Has patient had a PCN reaction occurring within the last 10 years: No If all of the above answers are "NO", then may proceed with Cephalosporin use.   Ramipril Other (See Comments)   Sulfonamide Derivatives Hives     Home Medications  Prior to Admission medications   Medication Sig Start Date End Date Taking? Authorizing Provider  acetaminophen (TYLENOL) 650 MG CR tablet Take 650-1,300 mg by mouth 2 (two) times daily as needed for pain.   Yes [provider]  albuterol (VENTOLIN HFA) 108 (90 Base) MCG/ACT inhaler Inhale 2 puffs into the lungs every 6 (six) hours as needed for shortness of breath or wheezing. 11/18/20  Yes [provider]  ALPRAZolam Duanne Moron) 0.5 MG tablet Take 0.5 mg by mouth at bedtime.   Yes [provider]  atorvastatin (LIPITOR) 10 MG tablet Take 10 mg by mouth at bedtime.   Yes [provider]  Blood Glucose Monitoring Suppl ([place]) w/Device KIT 1 each by Other route. Check blood sugar every morning. 02/12/19  Yes [provider]  cyclobenzaprine (FLEXERIL) 5 MG tablet Take 5 mg by mouth every 8 (eight) hours as needed for muscle spasms. 07/21/20  Yes [provider]  desonide (DESOWEN) 0.05 % cream Apply 1 application topically 2 (two) times daily as needed (for itching/dry skin).   Yes [provider]  ENTRESTO 49-51 MG Take 1 tablet by mouth twice daily Patient taking differently: Take 1 tablet by mouth 2 (two) times daily. 06/18/20  Yes Nahser, Wonda Cheng, MD  EYSUVIS 0.25 % SUSP Place 1 drop into both eyes in the morning, at noon, in the evening, and at bedtime. 01/09/20  Yes [provider]   meclizine (ANTIVERT) 25 MG tablet Take 1 tablet (25 mg total) by mouth 3 (three) times daily as needed for dizziness. 06/03/13  Yes Shon Baton, MD  metFORMIN (GLUCOPHAGE) 1000 MG tablet Take 1 tablet (1,000 mg total) by mouth 2 (two) times daily.  04/26/14  Yes Elgergawy, Silver Huguenin, MD  metroNIDAZOLE (METROCREAM) 0.75 % cream Apply 1 application topically daily as needed (to all itchy sites- as directed).  01/11/18  Yes [provider]  nitroGLYCERIN (NITROSTAT) 0.4 MG SL tablet Place 1 tablet (0.4 mg total) under the tongue every 5 (five) minutes as needed for chest pain. 05/09/20  Yes Nahser, Wonda Cheng, MD  ondansetron (ZOFRAN) 4 MG tablet Take 1 tablet (4 mg total) by mouth every 8 (eight) hours as needed for nausea or vomiting. 10/24/20  Yes Nahser, Wonda Cheng, MD  [facility] VERIO test strip 1 each by Other route. Check blood sugar every morning 01/08/18  Yes [provider]  pantoprazole (PROTONIX) 40 MG tablet Take 40 mg by mouth 2 (two) times daily. 11/06/20  Yes [provider]  SYNTHROID 50 MCG tablet Take 50 mcg by mouth daily. 05/12/16  Yes [provider]  SYSTANE ULTRA 0.4-0.3 % SOLN Place 2 drops into both eyes at bedtime. 05/29/19  Yes [provider]  aspirin EC 81 MG EC tablet Take 1 tablet (81 mg total) by mouth daily. Patient not taking: No sig reported 04/25/14   Elgergawy, Silver Huguenin, MD     Critical care time:     Gwyndolyn Saxon Opie Maclaughlin, DO 11/20/20 8:30 AM [facility]

## 2020-11-20 NOTE — Evaluation (Signed)
Physical Therapy Evaluation Patient Details Name: Kayla Medina MRN: [id number] DOB: 10-28-1932 Today's Date: 11/20/2020   History of Present Illness  87 y.o. female admitted 8/31 with dyspnea upon exertion. PMH: HTN, hypothyroidism, systolic CHF, takotsubo syndrome, T2DM, LBBB, and HLD  Clinical Impression  Pt limited by fatigue during session and ambulated with min A in room with RW. Pt's balance deficits improved with RW although still present, with difficulty side stepping. VSS on RA. Pt's deficits include decreased balance, mobility, and strength. Pt would benefit from continued PT to improve upon deficits and function. Recommend CIR, as pt is motivated to return to work and stated her daughter will be able to stay at home with her upon d/c from rehab.     Follow Up Recommendations CIR    Equipment Recommendations  None recommended by PT;Other (comment) (Pt has equipment at home)    Recommendations for Other Services       Precautions / Restrictions Precautions Precautions: None Restrictions Weight Bearing Restrictions: No      Mobility  Bed Mobility Overal bed mobility: Modified Independent             General bed mobility comments: increased time and effort with pt report of back pain    Transfers Overall transfer level: Needs assistance   Transfers: Sit to/from Stand Sit to Stand: Min guard         General transfer comment: cues for hand placement with min assist to sit at chair as pt not following commands to reach back. Rise from bed, chair and toilet.  Ambulation/Gait Ambulation/Gait assistance: Min assist Gait Distance (Feet): 18 Feet Assistive device: Rolling walker (2 wheeled) Gait Pattern/deviations: Step-to pattern;Trunk flexed;Decreased stride length   Gait velocity interpretation: <1.8 ft/sec, indicate of risk for recurrent falls General Gait Details: pt initially trying to step without UE support with unsteady gait and transitioned to use of  RW after 2 steps. pt with cues for posture, position in RW, direction and safety with min assist to control RW and for balance  Stairs            Wheelchair Mobility    Modified Rankin (Stroke Patients Only)       Balance Overall balance assessment: Needs assistance Sitting-balance support: No upper extremity supported Sitting balance-Leahy Scale: Fair Sitting balance - Comments: no UE support at EOB and toilet   Standing balance support: Bilateral upper extremity supported Standing balance-Leahy Scale: Poor Standing balance comment: requires bil ue support in standing                             Pertinent Vitals/Pain Pain Assessment: No/denies pain Pain Score: 4  Pain Location: back Pain Descriptors / Indicators: Aching Pain Intervention(s): Limited activity within patient's tolerance;Repositioned    Home Living Family/patient expects to be discharged to:: Private residence Living Arrangements: Children (son and girlfriend) Available Help at Discharge: Family;Available PRN/intermittently Type of Home: House Home Access: Stairs to enter Entrance Stairs-Rails: Right Entrance Stairs-Number of Steps: 6 Home Layout: One level Home Equipment: Walker - 2 wheels;Wheelchair - manual Additional Comments: pt works Barrister's clerk    Prior Function Level of Independence: Independent               Journalist, newspaper        Extremity/Trunk Assessment   Upper Extremity Assessment Upper Extremity Assessment: Defer to OT evaluation    Lower Extremity Assessment Lower Extremity Assessment: Generalized weakness  Cervical / Trunk Assessment Cervical / Trunk Assessment: Normal  Communication   Communication: No difficulties  Cognition Arousal/Alertness: Awake/alert Behavior During Therapy: WFL for tasks assessed/performed Overall Cognitive Status: Within Functional Limits for tasks assessed                                         General Comments      Exercises     Assessment/Plan    PT Assessment Patient needs continued PT services  PT Problem List Decreased strength;Decreased balance;Decreased mobility;Decreased activity tolerance       PT Treatment Interventions Gait training;DME instruction;Functional mobility training;Therapeutic activities;Balance training;Therapeutic exercise;Stair training    PT Goals (Current goals can be found in the Care Plan section)  Acute Rehab PT Goals Patient Stated Goal: return to work and read PT Goal Formulation: With patient Time For Goal Achievement: 12/04/20 Potential to Achieve Goals: Fair    Frequency Min 3X/week   Barriers to discharge        Co-evaluation               AM-PAC PT "6 Clicks" Mobility  Outcome Measure Help needed turning from your back to your side while in a flat bed without using bedrails?: None Help needed moving from lying on your back to sitting on the side of a flat bed without using bedrails?: None Help needed moving to and from a bed to a chair (including a wheelchair)?: A Little Help needed standing up from a chair using your arms (e.g., wheelchair or bedside chair)?: A Little Help needed to walk in hospital room?: A Lot Help needed climbing 3-5 steps with a railing? : A Lot 6 Click Score: 18    End of Session Equipment Utilized During Treatment: Gait belt Activity Tolerance: Patient limited by fatigue Patient left: with call bell/phone within reach;in chair Nurse Communication: Mobility status PT Visit Diagnosis: Unsteadiness on feet (R26.81);Muscle weakness (generalized) (M62.81);Other abnormalities of gait and mobility (R26.89)    Time: [id number] PT Time Calculation (min) (ACUTE ONLY): 32 min   Charges:   PT Evaluation $PT Eval Moderate Complexity: 1 Mod PT Treatments $Therapeutic Activity: 8-22 mins        Louie Casa, SPT Acute Rehab: [phone] [id number]    Domingo Dimes 11/20/2020, 11:32 AM

## 2020-11-21 ENCOUNTER — Ambulatory Visit (HOSPITAL_COMMUNITY): Payer: Medicare Other

## 2020-11-21 ENCOUNTER — Observation Stay (HOSPITAL_COMMUNITY): Payer: Medicare Other

## 2020-11-21 DIAGNOSIS — E038 Other specified hypothyroidism: Secondary | ICD-10-CM | POA: Diagnosis present

## 2020-11-21 DIAGNOSIS — Z20822 Contact with and (suspected) exposure to covid-19: Secondary | ICD-10-CM | POA: Diagnosis present

## 2020-11-21 DIAGNOSIS — J690 Pneumonitis due to inhalation of food and vomit: Secondary | ICD-10-CM | POA: Diagnosis present

## 2020-11-21 DIAGNOSIS — R0602 Shortness of breath: Secondary | ICD-10-CM | POA: Diagnosis present

## 2020-11-21 DIAGNOSIS — R0600 Dyspnea, unspecified: Secondary | ICD-10-CM | POA: Diagnosis not present

## 2020-11-21 DIAGNOSIS — K219 Gastro-esophageal reflux disease without esophagitis: Secondary | ICD-10-CM | POA: Diagnosis present

## 2020-11-21 DIAGNOSIS — Z8249 Family history of ischemic heart disease and other diseases of the circulatory system: Secondary | ICD-10-CM | POA: Diagnosis not present

## 2020-11-21 DIAGNOSIS — E039 Hypothyroidism, unspecified: Secondary | ICD-10-CM

## 2020-11-21 DIAGNOSIS — E1122 Type 2 diabetes mellitus with diabetic chronic kidney disease: Secondary | ICD-10-CM | POA: Diagnosis present

## 2020-11-21 DIAGNOSIS — E876 Hypokalemia: Secondary | ICD-10-CM | POA: Diagnosis present

## 2020-11-21 DIAGNOSIS — K862 Cyst of pancreas: Secondary | ICD-10-CM | POA: Diagnosis present

## 2020-11-21 DIAGNOSIS — Z8042 Family history of malignant neoplasm of prostate: Secondary | ICD-10-CM | POA: Diagnosis not present

## 2020-11-21 DIAGNOSIS — I5022 Chronic systolic (congestive) heart failure: Secondary | ICD-10-CM | POA: Diagnosis present

## 2020-11-21 DIAGNOSIS — E119 Type 2 diabetes mellitus without complications: Secondary | ICD-10-CM | POA: Diagnosis not present

## 2020-11-21 DIAGNOSIS — E1165 Type 2 diabetes mellitus with hyperglycemia: Secondary | ICD-10-CM | POA: Diagnosis present

## 2020-11-21 DIAGNOSIS — T380X5A Adverse effect of glucocorticoids and synthetic analogues, initial encounter: Secondary | ICD-10-CM | POA: Diagnosis not present

## 2020-11-21 DIAGNOSIS — J302 Other seasonal allergic rhinitis: Secondary | ICD-10-CM | POA: Diagnosis present

## 2020-11-21 DIAGNOSIS — I130 Hypertensive heart and chronic kidney disease with heart failure and stage 1 through stage 4 chronic kidney disease, or unspecified chronic kidney disease: Secondary | ICD-10-CM | POA: Diagnosis present

## 2020-11-21 DIAGNOSIS — Z820 Family history of epilepsy and other diseases of the nervous system: Secondary | ICD-10-CM | POA: Diagnosis not present

## 2020-11-21 DIAGNOSIS — R627 Adult failure to thrive: Secondary | ICD-10-CM | POA: Diagnosis present

## 2020-11-21 DIAGNOSIS — I493 Ventricular premature depolarization: Secondary | ICD-10-CM | POA: Diagnosis present

## 2020-11-21 DIAGNOSIS — R7881 Bacteremia: Secondary | ICD-10-CM | POA: Diagnosis not present

## 2020-11-21 DIAGNOSIS — N1831 Chronic kidney disease, stage 3a: Secondary | ICD-10-CM | POA: Diagnosis present

## 2020-11-21 DIAGNOSIS — E785 Hyperlipidemia, unspecified: Secondary | ICD-10-CM | POA: Diagnosis present

## 2020-11-21 DIAGNOSIS — Z66 Do not resuscitate: Secondary | ICD-10-CM | POA: Diagnosis present

## 2020-11-21 DIAGNOSIS — D638 Anemia in other chronic diseases classified elsewhere: Secondary | ICD-10-CM | POA: Diagnosis present

## 2020-11-21 DIAGNOSIS — E871 Hypo-osmolality and hyponatremia: Secondary | ICD-10-CM | POA: Diagnosis present

## 2020-11-21 DIAGNOSIS — I10 Essential (primary) hypertension: Secondary | ICD-10-CM | POA: Diagnosis not present

## 2020-11-21 DIAGNOSIS — E872 Acidosis: Secondary | ICD-10-CM | POA: Diagnosis present

## 2020-11-21 DIAGNOSIS — J9811 Atelectasis: Secondary | ICD-10-CM | POA: Diagnosis present

## 2020-11-21 LAB — BLOOD CULTURE ID PANEL (REFLEXED) - BCID2

## 2020-11-21 LAB — GLUCOSE, CAPILLARY
Glucose-Capillary: 178 mg/dL — ABNORMAL HIGH (ref 70–99)
Glucose-Capillary: 208 mg/dL — ABNORMAL HIGH (ref 70–99)
Glucose-Capillary: 237 mg/dL — ABNORMAL HIGH (ref 70–99)
Glucose-Capillary: 250 mg/dL — ABNORMAL HIGH (ref 70–99)

## 2020-11-21 LAB — BASIC METABOLIC PANEL
Anion gap: 13 (ref 5–15)
BUN: 23 mg/dL (ref 8–23)
CO2: 24 mmol/L (ref 22–32)
Calcium: 9 mg/dL (ref 8.9–10.3)
Chloride: 103 mmol/L (ref 98–111)
Creatinine, Ser: 1.28 mg/dL — ABNORMAL HIGH (ref 0.44–1.00)
GFR, Estimated: 41 mL/min — ABNORMAL LOW (ref >60)
Glucose, Bld: 184 mg/dL — ABNORMAL HIGH (ref 70–99)
Potassium: 3.6 mmol/L (ref 3.5–5.1)
Sodium: 140 mmol/L (ref 135–145)

## 2020-11-21 LAB — MAGNESIUM: Magnesium: 1.6 mg/dL — ABNORMAL LOW (ref 1.7–2.4)

## 2020-11-21 MED ORDER — METRONIDAZOLE 500 MG PO TABS
500.0000 mg | ORAL_TABLET | Freq: Three times a day (TID) | ORAL | Status: DC
  Administered 2020-11-21 – 2020-11-23 (×6): 500 mg via ORAL
  Filled 2020-11-21 (×7): qty 1

## 2020-11-21 MED ORDER — BUDESONIDE 0.5 MG/2ML IN SUSP
0.5000 mg | Freq: Two times a day (BID) | RESPIRATORY_TRACT | Status: DC
  Administered 2020-11-21 – 2020-11-23 (×2): 0.5 mg via RESPIRATORY_TRACT
  Filled 2020-11-21 (×3): qty 2

## 2020-11-21 MED ORDER — POTASSIUM CHLORIDE CRYS ER 20 MEQ PO TBCR
40.0000 meq | EXTENDED_RELEASE_TABLET | Freq: Once | ORAL | Status: AC
  Administered 2020-11-21: 40 meq via ORAL
  Filled 2020-11-21: qty 2

## 2020-11-21 MED ORDER — MAGNESIUM SULFATE 2 GM/50ML IV SOLN
2.0000 g | Freq: Once | INTRAVENOUS | Status: AC
  Administered 2020-11-21: 2 g via INTRAVENOUS
  Filled 2020-11-21: qty 50

## 2020-11-21 MED ORDER — ALBUTEROL SULFATE (2.5 MG/3ML) 0.083% IN NEBU
2.5000 mg | INHALATION_SOLUTION | Freq: Three times a day (TID) | RESPIRATORY_TRACT | Status: DC
  Administered 2020-11-21 – 2020-11-23 (×2): 2.5 mg via RESPIRATORY_TRACT
  Filled 2020-11-21 (×3): qty 3

## 2020-11-21 MED ORDER — PREDNISONE 20 MG PO TABS
40.0000 mg | ORAL_TABLET | Freq: Every day | ORAL | Status: DC
  Administered 2020-11-21 – 2020-11-23 (×3): 40 mg via ORAL
  Filled 2020-11-21 (×4): qty 2

## 2020-11-21 NOTE — Plan of Care (Signed)

## 2020-11-21 NOTE — Progress Notes (Signed)
PHARMACY - PHYSICIAN COMMUNICATION CRITICAL VALUE ALERT - BLOOD CULTURE IDENTIFICATION (BCID)  Kayla Medina is an 87 y.o. female who presented to [facility] on 11/19/2020 with a chief complaint of shortness of breath and weakness.  Assessment:   1/4 Bottles GPC in Clusters BCID detected Staphylococcus Species Likely represents a contaminant  Name of physician (or Provider) Contacted: Dr. [place] Pink  Current antibiotics:  Flagyl 500 mg PO q12h  Changes to prescribed antibiotics recommended:  CoAg Negative Staph Species in 1/4 bottles likely represents a contaminant.  No additional antibiotics recommended  Results for orders placed or performed during the hospital encounter of 11/19/20  Blood Culture ID Panel (Reflexed) (Collected: 11/20/2020  1:53 PM)  Result Value Ref Range   Enterococcus faecalis NOT DETECTED NOT DETECTED   Enterococcus Faecium NOT DETECTED NOT DETECTED   Listeria monocytogenes NOT DETECTED NOT DETECTED   Staphylococcus species DETECTED (A) NOT DETECTED   Staphylococcus aureus (BCID) NOT DETECTED NOT DETECTED   Staphylococcus epidermidis NOT DETECTED NOT DETECTED   Staphylococcus lugdunensis NOT DETECTED NOT DETECTED   Streptococcus species NOT DETECTED NOT DETECTED   Streptococcus agalactiae NOT DETECTED NOT DETECTED   Streptococcus pneumoniae NOT DETECTED NOT DETECTED   Streptococcus pyogenes NOT DETECTED NOT DETECTED   A.calcoaceticus-baumannii NOT DETECTED NOT DETECTED   Bacteroides fragilis NOT DETECTED NOT DETECTED   Enterobacterales NOT DETECTED NOT DETECTED   Enterobacter cloacae complex NOT DETECTED NOT DETECTED   Escherichia coli NOT DETECTED NOT DETECTED   Klebsiella aerogenes NOT DETECTED NOT DETECTED   Klebsiella oxytoca NOT DETECTED NOT DETECTED   Klebsiella pneumoniae NOT DETECTED NOT DETECTED   Proteus species NOT DETECTED NOT DETECTED   Salmonella species NOT DETECTED NOT DETECTED   Serratia marcescens NOT DETECTED NOT DETECTED   Haemophilus  influenzae NOT DETECTED NOT DETECTED   Neisseria meningitidis NOT DETECTED NOT DETECTED   Pseudomonas aeruginosa NOT DETECTED NOT DETECTED   Stenotrophomonas maltophilia NOT DETECTED NOT DETECTED   Candida albicans NOT DETECTED NOT DETECTED   Candida auris NOT DETECTED NOT DETECTED   Candida glabrata NOT DETECTED NOT DETECTED   Candida krusei NOT DETECTED NOT DETECTED   Candida parapsilosis NOT DETECTED NOT DETECTED   Candida tropicalis NOT DETECTED NOT DETECTED   Cryptococcus neoformans/gattii NOT DETECTED NOT DETECTED    Lestine Box, PharmD PGY2 Infectious Diseases Pharmacy Resident   Please check [email or web address] for unit-specific pharmacy phone numbers

## 2020-11-21 NOTE — Care Management Obs Status (Signed)
[facility] NOTIFICATION   Patient Details  Name: Kayla Medina MRN: [id number] Date of Birth: 1932/12/07   Medicare Observation Status Notification Given:  Yes    Angelita Ingles, RN 11/21/2020, 11:43 AM

## 2020-11-21 NOTE — Progress Notes (Signed)
NAME:  Kayla Medina, MRN:  [id number], DOB:  Jul 26, 1932, LOS: 0 ADMISSION DATE:  11/19/2020, CONSULTATION DATE:  11/19/20 REFERRING MD:  Hansel Feinstein, CHIEF COMPLAINT:  DOE   History of Present Illness:  Kayla Medina is an 87 y/o woman with a history of HFrEF with recovered EF who presented to the ED for evaluation of worsening SOB. This began about 3 weeks ago and has progressively worsened over that time.  She is short of breath at rest, with talking, and especially with trying to move around.  She has never had symptoms like this before.  There is no personal or family history of lung disease.  She has been coughing frequently, nonproductive, no hemoptysis.  She is not wheezing.  She feels like her cough has been improved for a few hours after taking albuterol.  She has been having abdominal pain from pulled muscles from frequent coughing.  She has been evaluated recently by her cardiologist who stopped Coreg due to bradycardia and hypotension, but this did not significantly change her symptoms.  She recently had spironolactone stopped as well, and she remains on Entresto.  Due to severe reflux, her to twice daily by her gastroenterologist.  She noticed her reflux was especially bad after eating fruit and tomatoes.  She has not been sick at all this summer, no upper respiratory symptoms.  She has chronic allergies for which she takes an antihistamine over-the-counter.  She has not had postnasal drip.  No edema, abdominal distention, chest pain, blood in her stool, or fevers.  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  She is not on a home diuretic, but recently has been drinking more water due to being told that she was dehydrated.  During the last few weeks she has had severely reduced appetite and has lost 3 to 5 pounds from poor p.o. intake.  She has a remote history of social smoking, never daily use.  She is accompanied today by her daughter, who corroborates the history.  Pertinent  Medical History  HFrEF>  recovered EF, was due to Takotsubo CM GERD Seasonal allergies CKD   Significant Hospital Events: Including procedures, antibiotic start and stop dates in addition to other pertinent events   8/31 admitted  Interim History / Subjective:  Reports diuresing but is not documented.  Not been ambulated since she desaturates.  Objective   Blood pressure (!) 142/52, pulse 94, temperature 98.6 F (37 C), temperature source Oral, resp. rate 19, height _0  (1.676 m), weight 68 kg, SpO2 98 %.        Intake/Output Summary (Last 24 hours) at 11/21/2020 1053 Last data filed at 11/20/2020 1930 Gross per 24 hour  Intake 120 ml  Output --  Net 120 ml   Filed Weights   11/19/20 1104  Weight: 68 kg    Examination: General: Obese female lying in bed in no acute distress at rest HEENT: No JVD or lymphadenopathy is appreciated Neuro: Hard of hearing but otherwise grossly intact CV: Heart sounds are regular regular rate and rhythm PULM: Breath sounds in the bases currently on room air with sats of 92%   GI: soft, bsx4 active  GU: Voiding multiple times turbinate not documented Extremities: warm/dry, negative edema  Skin: no rashes or lesions Lab Results  Component Value Date   CREATININE 1.28 (H) 11/21/2020   CREATININE 1.16 (H) 11/20/2020   CREATININE 1.21 (H) 11/19/2020   CREATININE 1.15 (H) 03/09/2016   CREATININE 1.05 (H) 11/28/2015   CREATININE  1.06 (H) 04/29/2015   No chest x-ray  Resolved Hospital Problem list     Assessment & Plan:  She has increasing dyspnea, cough and fatigue over the past 2-[redacted] weeks along with increased GERD symptoms. She has generalized symptoms at this time that are not very well explained by a specific etiology. She has elevated ESR and CRP. Her CT chest has scattered findings of groundlgass, possible consistent with aspiration pneumonia or pneumonitis. It is reassuring that she is on room air.  - empiric trial of inhaled steroid therapy - Will try  flagyl for aspiration pneumonia for 5 days - Continue PPI therapy for GERD - Follow up outpatient for PFTs  PCCM will sign off. Please call if questions arise.  Best Practice (right click and "Reselect all SmartList Selections" daily)   Per primary  Labs   CBC: Recent Labs  Lab 11/19/20 1107 11/20/20 0344  WBC 10.2 7.6  HGB 11.6* 11.1*  HCT 34.8* 33.5*  MCV 92.1 91.0  PLT 322 024    Basic Metabolic Panel: Recent Labs  Lab 11/19/20 1107 11/20/20 0344 11/21/20 0304  NA 132* 137 140  K 3.5 3.3* 3.6  CL 102 104 103  CO2 20* 21* 24  GLUCOSE 312* 403* 184*  BUN _0 CREATININE 1.21* 1.16* 1.28*  CALCIUM 8.6* 8.5* 9.0  MG  --   --  1.6*   GFR: Estimated Creatinine Clearance: 29.5 mL/min (A) (by C-G formula based on SCr of 1.28 mg/dL (H)). Recent Labs  Lab 11/19/20 1107 11/20/20 0344 11/20/20 1139  PROCALCITON  --   --  0.10  WBC 10.2 7.6  --     Liver Function Tests: Recent Labs  Lab 11/20/20 0344  AST 26  ALT 29  ALKPHOS 53  BILITOT 0.4  PROT 6.7  ALBUMIN 2.7*   No results for input(s): LIPASE, AMYLASE in the last 168 hours. No results for input(s): AMMONIA in the last 168 hours.  ABG No results found for: PHART, PCO2ART, PO2ART, HCO3, TCO2, ACIDBASEDEF, O2SAT   Coagulation Profile: No results for input(s): INR, PROTIME in the last 168 hours.  Cardiac Enzymes: No results for input(s): CKTOTAL, CKMB, CKMBINDEX, TROPONINI in the last 168 hours.  HbA1C: Hgb A1c MFr Bld  Date/Time Value Ref Range Status  11/19/2020 07:28 PM 8.6 (H) 4.8 - 5.6 % Final    Comment:    (NOTE) Pre diabetes:          5.7%-6.4%  Diabetes:              >6.4%  Glycemic control for   <7.0% adults with diabetes   04/23/2014 12:45 AM 8.2 (H) 4.8 - 5.6 % Final    Comment:    (NOTE)         Pre-diabetes: 5.7 - 6.4         Diabetes: >6.4         Glycemic control for adults with diabetes: <7.0     CBG: Recent Labs  Lab 11/20/20 0700 11/20/20 1208  11/20/20 1625 11/20/20 1942 11/21/20 0802  GLUCAP 356* 249* 179* 165* 178*    Critical care time:     Freda Jackson, MD [facility] Office: [phone]   See Amion for personal pager PCCM on call pager [phone] until 7pm. Please call Elink 7p-7a. [phone]

## 2020-11-21 NOTE — Progress Notes (Signed)
Physical Therapy Treatment Patient Details Name: Kayla Medina MRN: [id number] DOB: 08-28-1932 Today's Date: 11/21/2020    History of Present Illness 87 y.o. female admitted 8/31 with dyspnea upon exertion. PMH: HTN, hypothyroidism, systolic CHF, takotsubo syndrome, T2DM, LBBB, and HLD    PT Comments    Pt limited by fatigue during session with VSS on RA. Pt demonstrated decreased ambulation distance and cognition compared to previous session. Pt also required greater assist with transfers during session. Continue to recommend SNF given pt's performance in PT with decreased cognitive status.  Vitals during - HR 108, spO2 95%   Follow Up Recommendations  SNF     Equipment Recommendations  None recommended by PT    Recommendations for Other Services       Precautions / Restrictions Precautions Precautions: Fall    Mobility  Bed Mobility Overal bed mobility: Needs Assistance Bed Mobility: Supine to Sit     Supine to sit: Min assist;HOB elevated     General bed mobility comments: HHA to elevate trunk with cues to roll and push up. HOB 20 degrees    Transfers Overall transfer level: Needs assistance   Transfers: Sit to/from Stand Sit to Stand: Min assist         General transfer comment: cues for hand placement with assist to rise from bed  Ambulation/Gait   Gait Distance (Feet): 14 Feet Assistive device: Rolling walker (2 wheeled) Gait Pattern/deviations: Step-through pattern;Decreased stride length;Shuffle;Trunk flexed   Gait velocity interpretation: <1.8 ft/sec, indicate of risk for recurrent falls General Gait Details: min assist to control and direct RW. HR 108, SpO2 95% onRA. pt fatigues quickly and denied further gait or trials   Stairs             Wheelchair Mobility    Modified Rankin (Stroke Patients Only)       Balance Overall balance assessment: Needs assistance Sitting-balance support: No upper extremity supported Sitting  balance-Leahy Scale: Fair Sitting balance - Comments: no UE support at EOB   Standing balance support: Bilateral upper extremity supported Standing balance-Leahy Scale: Poor Standing balance comment: requires bil ue support in standing                            Cognition Arousal/Alertness: Awake/alert Behavior During Therapy: WFL for tasks assessed/performed Overall Cognitive Status: Impaired/Different from baseline Area of Impairment: Memory;Following commands                     Memory: Decreased short-term memory Following Commands: Follows one step commands consistently;Follows one step commands with increased time       General Comments: pt with decreased STM this session unable to recall blueberries removed from bed or plan during session to wash hands      Exercises General Exercises - Lower Extremity Long Arc Quad: AROM;Both;Seated;20 reps Hip Flexion/Marching: AROM;Both;Seated;10 reps    General Comments        Pertinent Vitals/Pain Pain Assessment: No/denies pain    Home Living                      Prior Function            PT Goals (current goals can now be found in the care plan section) Progress towards PT goals: Progressing toward goals (slowly)    Frequency    Min 3X/week      PT Plan Discharge plan needs to be updated  Co-evaluation              AM-PAC PT "6 Clicks" Mobility   Outcome Measure  Help needed turning from your back to your side while in a flat bed without using bedrails?: A Little Help needed moving from lying on your back to sitting on the side of a flat bed without using bedrails?: A Little Help needed moving to and from a bed to a chair (including a wheelchair)?: A Little Help needed standing up from a chair using your arms (e.g., wheelchair or bedside chair)?: A Little Help needed to walk in hospital room?: A Lot Help needed climbing 3-5 steps with a railing? : Total 6 Click Score:  15    End of Session Equipment Utilized During Treatment: Gait belt Activity Tolerance: Patient limited by fatigue Patient left: with call bell/phone within reach;in chair;with chair alarm set Nurse Communication: Mobility status PT Visit Diagnosis: Unsteadiness on feet (R26.81);Muscle weakness (generalized) (M62.81);Other abnormalities of gait and mobility (R26.89)     Time: [id number] PT Time Calculation (min) (ACUTE ONLY): 28 min  Charges:  $Gait Training: 8-22 mins $Therapeutic Exercise: 8-22 mins                     Louie Casa, SPT Acute Rehab: [phone]     Domingo Dimes 11/21/2020, 10:26 AM

## 2020-11-21 NOTE — Progress Notes (Addendum)
[facility] PROGRESS NOTE   Kayla Medina [id number] DOB: [birth year] DOA: 11/19/2020  PCP: Ginger Organ., MD  Brief History/Interval Summary:  87 y.o. female with medical history significant of HTN, hypothyroidism, systolic CHF, takotsubo syndrome, T2DM, LBBB and HLD who presented with 3 week history of shortness of breath upon exertion.  Her symptoms left her quite fatigued with inability to ambulate.  She apparently went to see her cardiologist who did an echocardiogram which showed normal systolic function.  She presented due to worsening symptoms.  CT scan was done which did not show any PE.  Pulmonology was consulted.  Patient was hospitalized for further management.  Consultants: Pulmonology  Procedures: None yet  Antibiotics: Anti-infectives (From admission, onward)    None       Subjective/Interval History: Patient continues to have difficulty breathing.  Gets very short of breath even with minimal exertion.  Noted to be quite tachypneic this morning.  Denies any chest pain.      Assessment/Plan:  Dyspnea Significant worsening in shortness of breath with exertion.   Etiology is unclear.  CT scan did not show anything obvious.  Symptoms thought to be multifactorial.  Chronic aspiration is a distinct possibility due to her severe GERD.  Pulmonary edema was also thought to be a possibility.  Echocardiogram done recently showed normal systolic function.   Pulmonology continues to follow.  Patient has been given Lasix without any appreciable improvement in her symptoms.  Await further input.  She remains on PPI as well.   Chest x-ray done this morning shows basilar atelectasis.  ADDENDUM Blood Cultures ordered by CCM positive for GPC. Possibly a contaminant as patient is afebrile and with normal WBC. Will monitor off of antibiotics for now. Low threshold to start Vanc if patient develops fever.  Chronic systolic CHF/Takotsubo syndrome Recently seen by her  cardiologist and underwent echocardiogram on 10/2018 which showed normal systolic function.  No evidence for fluid overload currently.  Continue Entresto and home medications.  Diabetes mellitus type 2 without complications, with hyperglycemia Does not take insulin at home.  Just on metformin.  Elevated glucose levels were noted to admission.  Patient was given steroids.  In the ED which could have accounted for high glucose levels.  Noted to be better over the last 12 to 24 hours.  HbA1c 8.6.  Essential hypertension Monitor blood pressures closely.  Hypothyroidism TSH noted to be low and free T4 is slightly higher.  Patient does not have any overt symptoms of hyperthyroidism.  Dose of Synthroid was decreased to 37.5 mcg.  Will need to recheck thyroid function tests in 3 weeks.  Chronic kidney disease stage IIIa/hyponatremia/hypokalemia Renal function close to baseline.  Avoid nephrotoxic agents.  Monitor urine output.  Potassium is normal.  Bicarbonate level has improved.  Remains on oral sodium bicarbonate.  Anemia of chronic disease Hemoglobin is stable.  No evidence of overt blood loss.  History of gastritis/GERD Recently diagnosed by PCP about a month ago.  She had abdominal imaging studies including ultrasound and MRI and MRCP which did not show any acute findings.  Dilated CBD was noted but no choledocholithiasis was present.  Cystic lesions of the pancreas was noted.  Repeat imaging study was recommended in 2 years. Continue PPI.  Hyperlipidemia Continue statin.  Failure to thrive This is thought to be secondary to other pulmonary issues.  PT and OT.    DVT Prophylaxis: Lovenox Code Status: DNR Family Communication: Discussed with patient.  No  family at bedside. Disposition Plan: Hopefully return home when improved  Status is: Observation  The patient will require care spanning > 2 midnights and should be moved to inpatient because: Ongoing diagnostic testing needed not  appropriate for outpatient work up, IV treatments appropriate due to intensity of illness or inability to take PO, and Inpatient level of care appropriate due to severity of illness  Dispo: The patient is from: Home              Anticipated d/c is to: Home              Patient currently is not medically stable to d/c.   Difficult to place patient No       Medications: Scheduled:  ALPRAZolam  0.5 mg Oral QHS   atorvastatin  10 mg Oral q1800   enoxaparin (LOVENOX) injection  40 mg Subcutaneous Q24H   feeding supplement (GLUCERNA SHAKE)  237 mL Oral TID BM   insulin aspart  0-9 Units Subcutaneous TID WC   levothyroxine  37.5 mcg Oral QAC breakfast   Loteprednol Etabonate  1 drop Both Eyes QID   multivitamin with minerals  1 tablet Oral Daily   pantoprazole  40 mg Oral BID   sacubitril-valsartan  1 tablet Oral BID   sodium bicarbonate  650 mg Oral BID   Continuous:  magnesium sulfate bolus IVPB     [id number] **OR** acetaminophen, guaiFENesin, ipratropium-albuterol, metroNIDAZOLE, nitroGLYCERIN, ondansetron **OR** ondansetron (ZOFRAN) IV   Objective:  Vital Signs  Vitals:   11/20/20 1624 11/20/20 1944 11/21/20 0546 11/21/20 1006  BP: (!) 106/44 134/68 (!) 142/52   Pulse: 98 84 79 94  Resp: (!) 23 (!) 22 19   Temp: 98 F (36.7 C) 97.9 F (36.6 C) 98.6 F (37 C)   TempSrc: Oral Oral Oral   SpO2: 94% 93% 93% 98%  Weight:      Height:        Intake/Output Summary (Last 24 hours) at 11/21/2020 1047 Last data filed at 11/20/2020 1930 Gross per 24 hour  Intake 120 ml  Output --  Net 120 ml    Filed Weights   11/19/20 1104  Weight: 68 kg    General appearance: Awake alert.  In no distress Resp: Noted to be tachypneic.  No use of accessory muscles.  Coarse breath sounds.  No definite wheezing or rhonchi.  Few crackles at bases. Cardio: S1-S2 is normal regular.  No S3-S4.  No rubs murmurs or bruit GI: Abdomen is soft.  Nontender nondistended.  Bowel sounds are  present normal.  No masses organomegaly Extremities: No edema.  Full range of motion of lower extremities. Neurologic: Alert and oriented x3.  No focal neurological deficits.    Lab Results:  Data Reviewed: I have personally reviewed following labs and imaging studies  CBC: Recent Labs  Lab 11/19/20 1107 11/20/20 0344  WBC 10.2 7.6  HGB 11.6* 11.1*  HCT 34.8* 33.5*  MCV 92.1 91.0  PLT 322 313     Basic Metabolic Panel: Recent Labs  Lab 11/19/20 1107 11/20/20 0344 11/21/20 0304  NA 132* 137 140  K 3.5 3.3* 3.6  CL 102 104 103  CO2 20* 21* 24  GLUCOSE 312* 403* 184*  BUN '12 16 23  '$ CREATININE 1.21* 1.16* 1.28*  CALCIUM 8.6* 8.5* 9.0  MG  --   --  1.6*     GFR: Estimated Creatinine Clearance: 29.5 mL/min (A) (by C-G formula based on SCr of 1.28 mg/dL (H)).  Liver Function Tests: Recent Labs  Lab 11/20/20 0344  AST 26  ALT 29  ALKPHOS 53  BILITOT 0.4  PROT 6.7  ALBUMIN 2.7*      BNP (last 3 results) Recent Labs    10/24/20 1348  PROBNP 240     HbA1C: Recent Labs    11/19/20 1928  HGBA1C 8.6*     CBG: Recent Labs  Lab 11/20/20 0700 11/20/20 1208 11/20/20 1625 11/20/20 1942 11/21/20 0802  GLUCAP 356* 249* 179* 165* 178*      Thyroid Function Tests: Recent Labs    11/19/20 2140  TSH 0.297*  FREET4 1.42*      Recent Results (from the past 240 hour(s))  Resp Panel by RT-PCR (Flu A&B, Covid) Nasopharyngeal Swab     Status: None   Collection Time: 11/19/20 11:09 AM   Specimen: Nasopharyngeal Swab; Nasopharyngeal(NP) swabs in vial transport medium  Result Value Ref Range Status   SARS Coronavirus 2 by RT PCR NEGATIVE NEGATIVE Final    Comment: (NOTE) SARS-CoV-2 target nucleic acids are NOT DETECTED.  The SARS-CoV-2 RNA is generally detectable in upper respiratory specimens during the acute phase of infection. The lowest concentration of SARS-CoV-2 viral copies this assay can detect is 138 copies/mL. A negative result does  not preclude SARS-Cov-2 infection and should not be used as the sole basis for treatment or other patient management decisions. A negative result may occur with  improper specimen collection/handling, submission of specimen other than nasopharyngeal swab, presence of viral mutation(s) within the areas targeted by this assay, and inadequate number of viral copies(<138 copies/mL). A negative result must be combined with clinical observations, patient history, and epidemiological information. The expected result is Negative.  Fact Sheet for Patients:  EntrepreneurPulse.com.au  Fact Sheet for Healthcare Providers:  IncredibleEmployment.be  This test is no t yet approved or cleared by the [place] FDA and  has been authorized for detection and/or diagnosis of SARS-CoV-2 by FDA under an Emergency Use Authorization (EUA). This EUA will remain  in effect (meaning this test can be used) for the duration of the COVID-19 declaration under Section 564(b)(1) of the Act, 21 U.S.C.section 360bbb-3(b)(1), unless the authorization is terminated  or revoked sooner.       Influenza A by PCR NEGATIVE NEGATIVE Final   Influenza B by PCR NEGATIVE NEGATIVE Final    Comment: (NOTE) The Xpert Xpress SARS-CoV-2/FLU/RSV plus assay is intended as an aid in the diagnosis of influenza from Nasopharyngeal swab specimens and should not be used as a sole basis for treatment. Nasal washings and aspirates are unacceptable for Xpert Xpress SARS-CoV-2/FLU/RSV testing.  Fact Sheet for Patients: EntrepreneurPulse.com.au  Fact Sheet for Healthcare Providers: IncredibleEmployment.be  This test is not yet approved or cleared by the [place] FDA and has been authorized for detection and/or diagnosis of SARS-CoV-2 by FDA under an Emergency Use Authorization (EUA). This EUA will remain in effect (meaning this test can be used) for the  duration of the COVID-19 declaration under Section 564(b)(1) of the Act, 21 U.S.C. section 360bbb-3(b)(1), unless the authorization is terminated or revoked.  Performed at [facility], [place] [address], [place], [place] [address]   Culture, blood (routine x 2)     Status: None (Preliminary result)   Collection Time: 11/20/20  1:47 PM   Specimen: BLOOD LEFT ARM  Result Value Ref Range Status   Specimen Description BLOOD LEFT ARM  Final   Special Requests   Final    BOTTLES  DRAWN AEROBIC AND ANAEROBIC Blood Culture adequate volume   Culture   Final    NO GROWTH < 24 HOURS Performed at [facility], [place] [address], [place], [place] [address]    Report Status PENDING  Incomplete  Culture, blood (routine x 2)     Status: None (Preliminary result)   Collection Time: 11/20/20  1:53 PM   Specimen: BLOOD LEFT HAND  Result Value Ref Range Status   Specimen Description BLOOD LEFT HAND  Final   Special Requests   Final    BOTTLES DRAWN AEROBIC AND ANAEROBIC Blood Culture adequate volume   Culture   Final    NO GROWTH < 24 HOURS Performed at [facility], [place] [address] [place], [place] [address]    Report Status PENDING  Incomplete       Radiology Studies: DG Chest 2 View  Result Date: 11/19/2020 CLINICAL DATA:  Chest x-ray dated February 06, 2018 EXAM: CHEST - 2 VIEW COMPARISON:  None. FINDINGS: Cardiac and mediastinal contours are within normal limits. Trace bilateral pleural effusions and bibasilar atelectasis.More focal nodular opacity is seen adjacent to the left diaphragm on lateral view. No evidence of pneumothorax. IMPRESSION: New trace bilateral pleural effusions and atelectasis. More focal nodular opacity is seen adjacent to the left diaphragm on lateral view, possibly an additional area of atelectasis. Recommend follow-up PA and lateral chest x-ray 4-6 weeks to ensure resolution. Electronically Signed   By: Yetta Glassman M.D.   On: 11/19/2020 12:30   CT  Angio Chest PE W/Cm &/Or Wo Cm  Result Date: 11/19/2020 CLINICAL DATA:  PE suspected, high prob EXAM: CT ANGIOGRAPHY CHEST WITH CONTRAST TECHNIQUE: Multidetector CT imaging of the chest was performed using the standard protocol during bolus administration of intravenous contrast. Multiplanar CT image reconstructions and MIPs were obtained to evaluate the vascular anatomy. CONTRAST:  83m OMNIPAQUE IOHEXOL 350 MG/ML SOLN COMPARISON:  None. FINDINGS: Cardiovascular: Satisfactory opacification of the pulmonary arteries to the segmental level. No evidence of pulmonary embolism. The thoracic aorta is normal in caliber. Normal heart size. No pericardial effusion. Mediastinum/Nodes: No pathologically enlarged mediastinal, hilar, or axillary lymph nodes. Prominent AP window lymph node measures 7 mm, within normal limits. The thyroid gland appears normal. Lungs/Pleura: No pleural effusion. No pneumothorax. Subsegmental atelectasis in the dependent aspects of the lungs. No suspicious pulmonary nodules. Musculoskeletal: No aggressive osseous lesions. Upper abdomen: The visualized upper abdomen is unremarkable. Review of the MIP images confirms the above findings. IMPRESSION: No evidence of pulmonary embolism. Subsegmental atelectasis in the dependent aspects of the lungs without other acute findings in the chest. Electronically Signed   By: YAlbin FellingM.D.   On: 11/19/2020 14:50   DG Chest Port 1 View  Result Date: 11/21/2020 CLINICAL DATA:  Abnormal respiration. EXAM: PORTABLE CHEST 1 VIEW COMPARISON:  11/19/2020 FINDINGS: 0629 hours. The lungs are clear without focal pneumonia, edema, pneumothorax or pleural effusion. Basilar atelectasis noted bilaterally. Cardiopericardial silhouette is at upper limits of normal for size. The visualized bony structures of the thorax show no acute abnormality. Telemetry leads overlie the chest. IMPRESSION: Basilar atelectasis.  No other acute cardiopulmonary finding. Electronically  Signed   By: EMisty StanleyM.D.   On: 11/21/2020 07:01       LOS: 0 days   [facility] Pager on [email or web address]  11/21/2020, 10:47 AM

## 2020-11-22 DIAGNOSIS — R0600 Dyspnea, unspecified: Secondary | ICD-10-CM | POA: Diagnosis not present

## 2020-11-22 DIAGNOSIS — R7881 Bacteremia: Secondary | ICD-10-CM | POA: Diagnosis not present

## 2020-11-22 DIAGNOSIS — E119 Type 2 diabetes mellitus without complications: Secondary | ICD-10-CM | POA: Diagnosis not present

## 2020-11-22 DIAGNOSIS — I10 Essential (primary) hypertension: Secondary | ICD-10-CM | POA: Diagnosis not present

## 2020-11-22 LAB — CBC
HCT: 33.5 % — ABNORMAL LOW (ref 36.0–46.0)
Hemoglobin: 11.2 g/dL — ABNORMAL LOW (ref 12.0–15.0)
MCH: 30.3 pg (ref 26.0–34.0)
MCHC: 33.4 g/dL (ref 30.0–36.0)
MCV: 90.5 fL (ref 80.0–100.0)
Platelets: 394 10*3/uL (ref 150–400)
RBC: 3.7 MIL/uL — ABNORMAL LOW (ref 3.87–5.11)
RDW: 12.9 % (ref 11.5–15.5)
WBC: 10 10*3/uL (ref 4.0–10.5)
nRBC: 0 % (ref 0.0–0.2)

## 2020-11-22 LAB — GLUCOSE, CAPILLARY
Glucose-Capillary: 253 mg/dL — ABNORMAL HIGH (ref 70–99)
Glucose-Capillary: 276 mg/dL — ABNORMAL HIGH (ref 70–99)
Glucose-Capillary: 221 mg/dL — ABNORMAL HIGH (ref 70–99)
Glucose-Capillary: 323 mg/dL — ABNORMAL HIGH (ref 70–99)

## 2020-11-22 LAB — BASIC METABOLIC PANEL
Anion gap: 10 (ref 5–15)
BUN: 23 mg/dL (ref 8–23)
CO2: 23 mmol/L (ref 22–32)
Calcium: 8.9 mg/dL (ref 8.9–10.3)
Chloride: 104 mmol/L (ref 98–111)
Creatinine, Ser: 1.24 mg/dL — ABNORMAL HIGH (ref 0.44–1.00)
GFR, Estimated: 42 mL/min — ABNORMAL LOW (ref >60)
Glucose, Bld: 350 mg/dL — ABNORMAL HIGH (ref 70–99)
Potassium: 4.1 mmol/L (ref 3.5–5.1)
Sodium: 137 mmol/L (ref 135–145)

## 2020-11-22 MED ORDER — INSULIN ASPART 100 UNIT/ML IJ SOLN
3.0000 [IU] | Freq: Three times a day (TID) | INTRAMUSCULAR | Status: DC
  Administered 2020-11-22 – 2020-11-23 (×3): 3 [IU] via SUBCUTANEOUS

## 2020-11-22 MED ORDER — INSULIN ASPART 100 UNIT/ML IJ SOLN
0.0000 [IU] | Freq: Every day | INTRAMUSCULAR | Status: DC
  Administered 2020-11-22: 4 [IU] via SUBCUTANEOUS

## 2020-11-22 MED ORDER — INSULIN ASPART 100 UNIT/ML IJ SOLN
0.0000 [IU] | Freq: Three times a day (TID) | INTRAMUSCULAR | Status: DC
Start: 2020-11-22 — End: 2020-11-23
  Administered 2020-11-22: 11 [IU] via SUBCUTANEOUS
  Administered 2020-11-22: 7 [IU] via SUBCUTANEOUS
  Administered 2020-11-23: 4 [IU] via SUBCUTANEOUS

## 2020-11-22 NOTE — Progress Notes (Signed)
[facility] PROGRESS NOTE   Kayla Medina [id number] DOB: 1932-07-30 DOA: 11/19/2020  PCP: Ginger Organ., MD  Brief History/Interval Summary:  87 y.o. female with medical history significant of HTN, hypothyroidism, systolic CHF, takotsubo syndrome, T2DM, LBBB and HLD who presented with 3 week history of shortness of breath upon exertion.  Her symptoms left her quite fatigued with inability to ambulate.  She apparently went to see her cardiologist who did an echocardiogram which showed normal systolic function.  She presented due to worsening symptoms.  CT scan was done which did not show any PE.  Pulmonology was consulted.  Patient was hospitalized for further management.  Consultants: Pulmonology  Procedures: None yet  Antibiotics: Anti-infectives (From admission, onward)    Start     Dose/Rate Route Frequency Ordered Stop   11/21/20 1400  metroNIDAZOLE (FLAGYL) tablet 500 mg        500 mg Oral Every 8 hours 11/21/20 1112 11/26/20 1359       Subjective/Interval History: Patient mentions that she is feeling better this morning.  Less short of breath compared to yesterday.  No chest pain.      Assessment/Plan:  Dyspnea with significant worsening on exertion Etiology is unclear.  CT scan did not show anything obvious.  Symptoms thought to be multifactorial.  Chronic aspiration is a distinct possibility due to her severe GERD.  Pulmonary edema was also thought to be a possibility.  Echocardiogram done recently showed normal systolic function.   Pulmonology was consulted.  Patient was given Lasix without any appreciable improvement in her symptoms Started on metronidazole yesterday along with steroids.  Feels better today.  Continue current treatment for now.  Patient remains on PPI as well.  Chest x-ray showed basilar atelectasis without any other acute findings.  Positive blood cultures Blood Cultures ordered by CCM positive for GPC. Patient remains afebrile with  normal WBC.  This is likely a contaminant.  Cultures repeated yesterday.  Continue to monitor off of antibiotics.    Chronic systolic CHF/Takotsubo syndrome Recently seen by her cardiologist and underwent echocardiogram on 10/2018 which showed normal systolic function.  No evidence for fluid overload currently.  Continue Entresto and home medications.  Diabetes mellitus type 2 without complications, with hyperglycemia Does not take insulin at home.  Just on metformin.   HbA1c 8.6.  Elevated CBGs due to steroids.  Will increase the SSI coverage.  May need basal insulin.    Essential hypertension Reasonably well-controlled.  Occasional high readings could be due to the fact that she is now on steroids.  Hypothyroidism TSH noted to be low and free T4 is slightly higher.  Patient does not have any overt symptoms of hyperthyroidism.  Dose of Synthroid was decreased to 37.5 mcg.  Will need to recheck thyroid function tests in 3 weeks.  Chronic kidney disease stage IIIa/hyponatremia/hypokalemia Renal function close to baseline.  Avoid nephrotoxic agents.  Monitor urine output.  Potassium is normal.  Bicarbonate level has improved.  Remains on oral sodium bicarbonate.  Anemia of chronic disease Hemoglobin is stable.  No evidence of overt blood loss.  History of gastritis/GERD/Abnormal imaging studies Recently diagnosed by PCP about a month ago.  She had abdominal imaging studies including ultrasound and MRI and MRCP which did not show any acute findings.  Dilated CBD was noted but no choledocholithiasis was present.  Cystic lesions of the pancreas was noted.  Repeat imaging study was recommended in 2 years. Continue PPI.  Hyperlipidemia Continue statin.  Failure to thrive This is thought to be secondary to other pulmonary issues.  PT and OT recommended SNF.  Patient reluctant.  She wants to see how she does over the next 24 hours.  DVT Prophylaxis: Lovenox Code Status: DNR Family  Communication: Discussed with patient.  Daughter at bedside. Disposition Plan: Hopefully return home when improved  Status is: Inpatient  Remains inpatient appropriate because:Inpatient level of care appropriate due to severity of illness  Dispo:  Patient From: Home  Planned Disposition: To be determined  Medically stable for discharge: No       Medications: Scheduled:  albuterol  2.5 mg Nebulization TID   ALPRAZolam  0.5 mg Oral QHS   atorvastatin  10 mg Oral q1800   budesonide (PULMICORT) nebulizer solution  0.5 mg Nebulization BID   enoxaparin (LOVENOX) injection  40 mg Subcutaneous Q24H   feeding supplement (GLUCERNA SHAKE)  237 mL Oral TID BM   insulin aspart  0-9 Units Subcutaneous TID WC   levothyroxine  37.5 mcg Oral QAC breakfast   Loteprednol Etabonate  1 drop Both Eyes QID   metroNIDAZOLE  500 mg Oral Q8H   multivitamin with minerals  1 tablet Oral Daily   pantoprazole  40 mg Oral BID   predniSONE  40 mg Oral Q breakfast   sacubitril-valsartan  1 tablet Oral BID   sodium bicarbonate  650 mg Oral BID   Continuous:  [id number] **OR** acetaminophen, guaiFENesin, ipratropium-albuterol, metroNIDAZOLE, nitroGLYCERIN, ondansetron **OR** ondansetron (ZOFRAN) IV   Objective:  Vital Signs  Vitals:   11/21/20 2000 11/21/20 2039 11/21/20 2100 11/22/20 0354  BP: (!) 144/55   122/63  Pulse: 76     Resp: (!) 22   19  Temp:   97.6 F (36.4 C) 98.3 F (36.8 C)  TempSrc:   Oral Oral  SpO2: 95% 97%  94%  Weight:    70.2 kg  Height:       No intake or output data in the 24 hours ending 11/22/20 1132  Filed Weights   11/19/20 1104 11/22/20 0354  Weight: 68 kg 70.2 kg    General appearance: Awake alert.  In no distress Resp: Noted to be less tachypneic today compared to yesterday.  Coarse breath sounds with no wheezing rales or rhonchi. Cardio: S1-S2 is normal regular.  No S3-S4.  No rubs murmurs or bruit GI: Abdomen is soft.  Nontender nondistended.  Bowel  sounds are present normal.  No masses organomegaly Extremities: No edema.  Full range of motion of lower extremities. Neurologic: Alert and oriented x3.  No focal neurological deficits.    Lab Results:  Data Reviewed: I have personally reviewed following labs and imaging studies  CBC: Recent Labs  Lab 11/19/20 1107 11/20/20 0344 11/22/20 0227  WBC 10.2 7.6 10.0  HGB 11.6* 11.1* 11.2*  HCT 34.8* 33.5* 33.5*  MCV 92.1 91.0 90.5  PLT 322 313 394     Basic Metabolic Panel: Recent Labs  Lab 11/19/20 1107 11/20/20 0344 11/21/20 0304 11/22/20 0227  NA 132* 137 140 137  K 3.5 3.3* 3.6 4.1  CL 102 104 103 104  CO2 20* 21* 24 23  GLUCOSE 312* 403* 184* 350*  BUN '12 16 23 23  '$ CREATININE 1.21* 1.16* 1.28* 1.24*  CALCIUM 8.6* 8.5* 9.0 8.9  MG  --   --  1.6*  --      GFR: Estimated Creatinine Clearance: 30.5 mL/min (A) (by C-G formula based on SCr of 1.24 mg/dL (H)).  Liver  Function Tests: Recent Labs  Lab 11/20/20 0344  AST 26  ALT 29  ALKPHOS 53  BILITOT 0.4  PROT 6.7  ALBUMIN 2.7*      BNP (last 3 results) Recent Labs    10/24/20 1348  PROBNP 240     HbA1C: Recent Labs    11/19/20 1928  HGBA1C 8.6*     CBG: Recent Labs  Lab 11/21/20 0802 11/21/20 1143 11/21/20 1624 11/21/20 2120 11/22/20 0741  GLUCAP 178* 208* 237* 250* 253*      Thyroid Function Tests: Recent Labs    11/19/20 2140  TSH 0.297*  FREET4 1.42*      Recent Results (from the past 240 hour(s))  Resp Panel by RT-PCR (Flu A&B, Covid) Nasopharyngeal Swab     Status: None   Collection Time: 11/19/20 11:09 AM   Specimen: Nasopharyngeal Swab; Nasopharyngeal(NP) swabs in vial transport medium  Result Value Ref Range Status   SARS Coronavirus 2 by RT PCR NEGATIVE NEGATIVE Final    Comment: (NOTE) SARS-CoV-2 target nucleic acids are NOT DETECTED.  The SARS-CoV-2 RNA is generally detectable in upper respiratory specimens during the acute phase of infection. The  lowest concentration of SARS-CoV-2 viral copies this assay can detect is 138 copies/mL. A negative result does not preclude SARS-Cov-2 infection and should not be used as the sole basis for treatment or other patient management decisions. A negative result may occur with  improper specimen collection/handling, submission of specimen other than nasopharyngeal swab, presence of viral mutation(s) within the areas targeted by this assay, and inadequate number of viral copies(<138 copies/mL). A negative result must be combined with clinical observations, patient history, and epidemiological information. The expected result is Negative.  Fact Sheet for Patients:  EntrepreneurPulse.com.au  Fact Sheet for Healthcare Providers:  IncredibleEmployment.be  This test is no t yet approved or cleared by the [place] FDA and  has been authorized for detection and/or diagnosis of SARS-CoV-2 by FDA under an Emergency Use Authorization (EUA). This EUA will remain  in effect (meaning this test can be used) for the duration of the COVID-19 declaration under Section 564(b)(1) of the Act, 21 U.S.C.section 360bbb-3(b)(1), unless the authorization is terminated  or revoked sooner.       Influenza A by PCR NEGATIVE NEGATIVE Final   Influenza B by PCR NEGATIVE NEGATIVE Final    Comment: (NOTE) The Xpert Xpress SARS-CoV-2/FLU/RSV plus assay is intended as an aid in the diagnosis of influenza from Nasopharyngeal swab specimens and should not be used as a sole basis for treatment. Nasal washings and aspirates are unacceptable for Xpert Xpress SARS-CoV-2/FLU/RSV testing.  Fact Sheet for Patients: EntrepreneurPulse.com.au  Fact Sheet for Healthcare Providers: IncredibleEmployment.be  This test is not yet approved or cleared by the [place] FDA and has been authorized for detection and/or diagnosis of SARS-CoV-2 by FDA under  an Emergency Use Authorization (EUA). This EUA will remain in effect (meaning this test can be used) for the duration of the COVID-19 declaration under Section 564(b)(1) of the Act, 21 U.S.C. section 360bbb-3(b)(1), unless the authorization is terminated or revoked.  Performed at [facility], [place] [address] [place], [place] [address]   Culture, blood (routine x 2)     Status: None (Preliminary result)   Collection Time: 11/20/20  1:47 PM   Specimen: BLOOD LEFT ARM  Result Value Ref Range Status   Specimen Description BLOOD LEFT ARM  Final   Special Requests   Final    BOTTLES DRAWN  AEROBIC AND ANAEROBIC Blood Culture adequate volume   Culture  Setup Time   Final    GRAM POSITIVE COCCI IN CLUSTERS AEROBIC BOTTLE ONLY CRITICAL VALUE NOTED.  VALUE IS CONSISTENT WITH PREVIOUSLY REPORTED AND CALLED VALUE.    Culture   Final    GRAM POSITIVE COCCI CULTURE REINCUBATED FOR BETTER GROWTH Performed at [facility], [place] [address] [place], [place] [address]    Report Status PENDING  Incomplete  Culture, blood (routine x 2)     Status: None (Preliminary result)   Collection Time: 11/20/20  1:53 PM   Specimen: BLOOD LEFT HAND  Result Value Ref Range Status   Specimen Description BLOOD LEFT HAND  Final   Special Requests   Final    BOTTLES DRAWN AEROBIC AND ANAEROBIC Blood Culture adequate volume   Culture  Setup Time   Final    GRAM POSITIVE COCCI IN CLUSTERS AEROBIC BOTTLE ONLY CRITICAL RESULT CALLED TO, READ BACK BY AND VERIFIED WITH: PHARMD ALEX L 1308 [id number] FCP    Culture   Final    GRAM POSITIVE COCCI CULTURE REINCUBATED FOR BETTER GROWTH Performed at [facility], [place] [address] [place], [place] [address]    Report Status PENDING  Incomplete  Blood Culture ID Panel (Reflexed)     Status: Abnormal   Collection Time: 11/20/20  1:53 PM  Result Value Ref Range Status   Enterococcus faecalis NOT DETECTED NOT DETECTED Final   Enterococcus Faecium NOT  DETECTED NOT DETECTED Final   Listeria monocytogenes NOT DETECTED NOT DETECTED Final   Staphylococcus species DETECTED (A) NOT DETECTED Final    Comment: CRITICAL RESULT CALLED TO, READ BACK BY AND VERIFIED WITH: PHARMD ALEX L 1308 [id number] FCP    Staphylococcus aureus (BCID) NOT DETECTED NOT DETECTED Final   Staphylococcus epidermidis NOT DETECTED NOT DETECTED Final   Staphylococcus lugdunensis NOT DETECTED NOT DETECTED Final   Streptococcus species NOT DETECTED NOT DETECTED Final   Streptococcus agalactiae NOT DETECTED NOT DETECTED Final   Streptococcus pneumoniae NOT DETECTED NOT DETECTED Final   Streptococcus pyogenes NOT DETECTED NOT DETECTED Final   A.calcoaceticus-baumannii NOT DETECTED NOT DETECTED Final   Bacteroides fragilis NOT DETECTED NOT DETECTED Final   Enterobacterales NOT DETECTED NOT DETECTED Final   Enterobacter cloacae complex NOT DETECTED NOT DETECTED Final   Escherichia coli NOT DETECTED NOT DETECTED Final   Klebsiella aerogenes NOT DETECTED NOT DETECTED Final   Klebsiella oxytoca NOT DETECTED NOT DETECTED Final   Klebsiella pneumoniae NOT DETECTED NOT DETECTED Final   Proteus species NOT DETECTED NOT DETECTED Final   Salmonella species NOT DETECTED NOT DETECTED Final   Serratia marcescens NOT DETECTED NOT DETECTED Final   Haemophilus influenzae NOT DETECTED NOT DETECTED Final   Neisseria meningitidis NOT DETECTED NOT DETECTED Final   Pseudomonas aeruginosa NOT DETECTED NOT DETECTED Final   Stenotrophomonas maltophilia NOT DETECTED NOT DETECTED Final   Candida albicans NOT DETECTED NOT DETECTED Final   Candida auris NOT DETECTED NOT DETECTED Final   Candida glabrata NOT DETECTED NOT DETECTED Final   Candida krusei NOT DETECTED NOT DETECTED Final   Candida parapsilosis NOT DETECTED NOT DETECTED Final   Candida tropicalis NOT DETECTED NOT DETECTED Final   Cryptococcus neoformans/gattii NOT DETECTED NOT DETECTED Final    Comment: Performed at [facility], [address], [place], [place] [address]  Culture, blood (Routine X 2) w Reflex to ID Panel     Status: None (Preliminary result)  Collection Time: 11/21/20  3:36 PM   Specimen: BLOOD LEFT HAND  Result Value Ref Range Status   Specimen Description BLOOD LEFT HAND  Final   Special Requests   Final    BOTTLES DRAWN AEROBIC AND ANAEROBIC Blood Culture adequate volume   Culture   Final    NO GROWTH < 24 HOURS Performed at [facility], [address], [place], [place] [address]    Report Status PENDING  Incomplete  Culture, blood (Routine X 2) w Reflex to ID Panel     Status: None (Preliminary result)   Collection Time: 11/21/20  3:48 PM   Specimen: BLOOD RIGHT ARM  Result Value Ref Range Status   Specimen Description BLOOD RIGHT ARM  Final   Special Requests   Final    BOTTLES DRAWN AEROBIC AND ANAEROBIC Blood Culture adequate volume   Culture   Final    NO GROWTH < 24 HOURS Performed at [facility], [place] [address] [place],  [address]    Report Status PENDING  Incomplete       Radiology Studies: DG Chest Port 1 View  Result Date: 11/21/2020 CLINICAL DATA:  Abnormal respiration. EXAM: PORTABLE CHEST 1 VIEW COMPARISON:  11/19/2020 FINDINGS: 0629 hours. The lungs are clear without focal pneumonia, edema, pneumothorax or pleural effusion. Basilar atelectasis noted bilaterally. Cardiopericardial silhouette is at upper limits of normal for size. The visualized bony structures of the thorax show no acute abnormality. Telemetry leads overlie the chest. IMPRESSION: Basilar atelectasis.  No other acute cardiopulmonary finding. Electronically Signed   By: Misty Stanley M.D.   On: 11/21/2020 07:01       LOS: 1 day   [facility] Pager on [email or web address]  11/22/2020, 11:32 AM

## 2020-11-23 DIAGNOSIS — R0600 Dyspnea, unspecified: Secondary | ICD-10-CM | POA: Diagnosis not present

## 2020-11-23 LAB — BASIC METABOLIC PANEL
Anion gap: 7 (ref 5–15)
BUN: 21 mg/dL (ref 8–23)
CO2: 22 mmol/L (ref 22–32)
Calcium: 8.7 mg/dL — ABNORMAL LOW (ref 8.9–10.3)
Chloride: 107 mmol/L (ref 98–111)
Creatinine, Ser: 1.24 mg/dL — ABNORMAL HIGH (ref 0.44–1.00)
GFR, Estimated: 42 mL/min — ABNORMAL LOW (ref >60)
Glucose, Bld: 323 mg/dL — ABNORMAL HIGH (ref 70–99)
Potassium: 3.7 mmol/L (ref 3.5–5.1)
Sodium: 136 mmol/L (ref 135–145)

## 2020-11-23 LAB — CULTURE, BLOOD (ROUTINE X 2)
Special Requests: ADEQUATE
Special Requests: ADEQUATE

## 2020-11-23 LAB — GLUCOSE, CAPILLARY
Glucose-Capillary: 200 mg/dL — ABNORMAL HIGH (ref 70–99)
Glucose-Capillary: 194 mg/dL — ABNORMAL HIGH (ref 70–99)

## 2020-11-23 MED ORDER — BENZONATATE 100 MG PO CAPS: 100.0000 mg | ORAL_CAPSULE | Freq: Three times a day (TID) | ORAL | 0 refills | Status: DC | PRN

## 2020-11-23 MED ORDER — LEVOTHYROXINE SODIUM 75 MCG PO TABS: 37.5000 ug | ORAL_TABLET | Freq: Every day | ORAL | 1 refills | Status: DC

## 2020-11-23 MED ORDER — IPRATROPIUM-ALBUTEROL 0.5-2.5 (3) MG/3ML IN SOLN: 3.0000 mL | Freq: Four times a day (QID) | RESPIRATORY_TRACT | 0 refills | Status: DC | PRN

## 2020-11-23 MED ORDER — MOMETASONE FURO-FORMOTEROL FUM 100-5 MCG/ACT IN AERO: 2.0000 | INHALATION_SPRAY | Freq: Two times a day (BID) | RESPIRATORY_TRACT | 1 refills | Status: DC

## 2020-11-23 MED ORDER — BENZONATATE 100 MG PO CAPS: 100.0000 mg | ORAL_CAPSULE | Freq: Three times a day (TID) | ORAL | Status: DC | PRN

## 2020-11-23 MED ORDER — PREDNISONE 10 MG PO TABS: ORAL_TABLET | ORAL | 0 refills | Status: DC

## 2020-11-23 MED ORDER — METRONIDAZOLE 500 MG PO TABS: 500.0000 mg | ORAL_TABLET | Freq: Three times a day (TID) | ORAL | 0 refills | Status: AC

## 2020-11-23 NOTE — Plan of Care (Signed)

## 2020-11-23 NOTE — Discharge Summary (Signed)
[facility]  Physician Discharge Summary   Patient ID: Kayla Medina MRN: [id number] DOB/AGE: [birth year] 87 y.o.  Admit date: 11/19/2020 Discharge date: 11/23/2020    PCP: Ginger Organ., MD  DISCHARGE DIAGNOSES:  Dyspnea thought to be secondary to aspiration Chronic systolic CHF Diabetes mellitus type 2 without complications Essential hypertension Hypothyroidism Chronic renal disease stage IIIa History of gastritis and GERD Anemia of chronic disease Positive blood cultures thought to be contaminant   RECOMMENDATIONS FOR OUTPATIENT FOLLOW UP: Dose of levothyroxine has been decreased due to elevated free T4 and low TSH.   Please recheck thyroid function test in 3 to 4 weeks.   Referral sent to pulmonology Patient will need repeat abdominal studies for findings noted on recent MRI abdomen    Home Health: PT and OT Equipment/Devices: None  CODE STATUS: DNR  DISCHARGE CONDITION: fair  Diet recommendation: As before  INITIAL HISTORY: 87 y.o. female with medical history significant of HTN, hypothyroidism, systolic CHF, takotsubo syndrome, T2DM, LBBB and HLD who presented with 3 week history of shortness of breath upon exertion.  Her symptoms left her quite fatigued with inability to ambulate.  She apparently went to see her cardiologist who did an echocardiogram which showed normal systolic function.  She presented due to worsening symptoms.  CT scan was done which did not show any PE.  Pulmonology was consulted.  Patient was hospitalized for further management.   Consultants: Pulmonology   HOSPITAL COURSE:   Dyspnea with significant worsening on exertion/concern for aspiration CT scan did not show anything obvious.  Symptoms thought to be multifactorial.  Chronic aspiration is a distinct possibility due to her severe GERD.  Pulmonary edema was also thought to be a possibility.  Echocardiogram done recently showed normal systolic function.   Pulmonology was  consulted.  Patient was given Lasix without any appreciable improvement in her symptoms. Saturations were normal on room air. Subsequently started on metronidazole along with steroids.  Showed improvement with this treatment regimen.  Also maintained on PPI.  Chest x-ray showed bibasilar atelectasis without acute findings.  Patient feels much better today.  Refuses to go to skilled nursing facility.  Agreeable to home health.  Discussed with daughter as well.  We will send ambulatory referral to pulmonology for outpatient follow-up.   Positive blood cultures with staph capitis, contaminant Blood Cultures ordered by CCM positive for GPC.  Patient was noted to be afebrile with normal white blood cell count.  Grew out to be Staph capitis.  This is most likely a contaminant.  Blood cultures were repeated which have been negative so far.  She was not given any antibiotics for this particular issue.     Chronic systolic CHF/Takotsubo syndrome Recently seen by her cardiologist and underwent echocardiogram on 10/2018 which showed normal systolic function.  No evidence for fluid overload currently.  Continue Entresto and home medications.   Diabetes mellitus type 2 without complications, with hyperglycemia Does not take insulin at home.  Just on metformin.   HbA1c 8.6.  Elevated glucose levels due to steroids which should come back to her baseline as its tapered off.   Essential hypertension Reasonably well-controlled.    Hypothyroidism TSH noted to be low and free T4 is slightly higher.  Patient does not have any overt symptoms of hyperthyroidism.  Dose of Synthroid was decreased to 37.5 mcg.  Will need to recheck thyroid function tests in 3 weeks.   Chronic kidney disease stage IIIa/hyponatremia/hypokalemia Renal function close to baseline.  Anemia of chronic disease Hemoglobin is stable.  No evidence of overt blood loss.   History of gastritis/GERD/Abnormal imaging studies Recently diagnosed  by PCP about a month ago.  She had abdominal imaging studies including ultrasound and MRI and MRCP which did not show any acute findings.  Dilated CBD was noted but no choledocholithiasis was present.  Cystic lesions of the pancreas was noted.  Repeat imaging study was recommended in 2 years. Continue PPI.   Hyperlipidemia Continue statin.  Failure to thrive This is thought to be secondary to other pulmonary issues.  PT and OT recommended SNF.  Patient reluctant.  She wants to see how she does over the next 24 hours.    Patient is stable.  Okay for discharge home today.   PERTINENT LABS:  The results of significant diagnostics from this hospitalization (including imaging, microbiology, ancillary and laboratory) are listed below for reference.    Microbiology: Recent Results (from the past 240 hour(s))  Resp Panel by RT-PCR (Flu A&B, Covid) Nasopharyngeal Swab     Status: None   Collection Time: 11/19/20 11:09 AM   Specimen: Nasopharyngeal Swab; Nasopharyngeal(NP) swabs in vial transport medium  Result Value Ref Range Status   SARS Coronavirus 2 by RT PCR NEGATIVE NEGATIVE Final    Comment: (NOTE) SARS-CoV-2 target nucleic acids are NOT DETECTED.  The SARS-CoV-2 RNA is generally detectable in upper respiratory specimens during the acute phase of infection. The lowest concentration of SARS-CoV-2 viral copies this assay can detect is 138 copies/mL. A negative result does not preclude SARS-Cov-2 infection and should not be used as the sole basis for treatment or other patient management decisions. A negative result may occur with  improper specimen collection/handling, submission of specimen other than nasopharyngeal swab, presence of viral mutation(s) within the areas targeted by this assay, and inadequate number of viral copies(<138 copies/mL). A negative result must be combined with clinical observations, patient history, and epidemiological information. The expected result is  Negative.  Fact Sheet for Patients:  EntrepreneurPulse.com.au  Fact Sheet for Healthcare Providers:  IncredibleEmployment.be  This test is no t yet approved or cleared by the [place] FDA and  has been authorized for detection and/or diagnosis of SARS-CoV-2 by FDA under an Emergency Use Authorization (EUA). This EUA will remain  in effect (meaning this test can be used) for the duration of the COVID-19 declaration under Section 564(b)(1) of the Act, 21 U.S.C.section 360bbb-3(b)(1), unless the authorization is terminated  or revoked sooner.       Influenza A by PCR NEGATIVE NEGATIVE Final   Influenza B by PCR NEGATIVE NEGATIVE Final    Comment: (NOTE) The Xpert Xpress SARS-CoV-2/FLU/RSV plus assay is intended as an aid in the diagnosis of influenza from Nasopharyngeal swab specimens and should not be used as a sole basis for treatment. Nasal washings and aspirates are unacceptable for Xpert Xpress SARS-CoV-2/FLU/RSV testing.  Fact Sheet for Patients: EntrepreneurPulse.com.au  Fact Sheet for Healthcare Providers: IncredibleEmployment.be  This test is not yet approved or cleared by the [place] FDA and has been authorized for detection and/or diagnosis of SARS-CoV-2 by FDA under an Emergency Use Authorization (EUA). This EUA will remain in effect (meaning this test can be used) for the duration of the COVID-19 declaration under Section 564(b)(1) of the Act, 21 U.S.C. section 360bbb-3(b)(1), unless the authorization is terminated or revoked.  Performed at [facility], [place] [address] [place], [place] [address]   Culture, blood (routine x 2)  Status: Abnormal   Collection Time: 11/20/20  1:47 PM   Specimen: BLOOD LEFT ARM  Result Value Ref Range Status   Specimen Description BLOOD LEFT ARM  Final   Special Requests   Final    BOTTLES DRAWN AEROBIC AND ANAEROBIC Blood Culture adequate  volume   Culture  Setup Time   Final    GRAM POSITIVE COCCI IN CLUSTERS AEROBIC BOTTLE ONLY CRITICAL VALUE NOTED.  VALUE IS CONSISTENT WITH PREVIOUSLY REPORTED AND CALLED VALUE.    Culture (A)  Final    STAPHYLOCOCCUS CAPITIS SUSCEPTIBILITIES PERFORMED ON PREVIOUS CULTURE WITHIN THE LAST 5 DAYS. Performed at [facility], [place] [address], [place], [place] [address]    Report Status 11/23/2020 FINAL  Final  Culture, blood (routine x 2)     Status: Abnormal   Collection Time: 11/20/20  1:53 PM   Specimen: BLOOD LEFT HAND  Result Value Ref Range Status   Specimen Description BLOOD LEFT HAND  Final   Special Requests   Final    BOTTLES DRAWN AEROBIC AND ANAEROBIC Blood Culture adequate volume   Culture  Setup Time   Final    GRAM POSITIVE COCCI IN CLUSTERS AEROBIC BOTTLE ONLY CRITICAL RESULT CALLED TO, READ BACK BY AND VERIFIED WITH: PHARMD ALEX L 1308 [id number] FCP Performed at [facility], [place] [address], [place], [place] [address]    Culture STAPHYLOCOCCUS CAPITIS (A)  Final   Report Status 11/23/2020 FINAL  Final   Organism ID, Bacteria STAPHYLOCOCCUS CAPITIS  Final      Susceptibility   Staphylococcus capitis - MIC*    CIPROFLOXACIN 2 INTERMEDIATE Intermediate     ERYTHROMYCIN <=0.25 SENSITIVE Sensitive     GENTAMICIN <=0.5 SENSITIVE Sensitive     OXACILLIN <=0.25 SENSITIVE Sensitive     TETRACYCLINE <=1 SENSITIVE Sensitive     VANCOMYCIN <=0.5 SENSITIVE Sensitive     TRIMETH/SULFA <=10 SENSITIVE Sensitive     CLINDAMYCIN <=0.25 SENSITIVE Sensitive     RIFAMPIN <=0.5 SENSITIVE Sensitive     Inducible Clindamycin NEGATIVE Sensitive     * STAPHYLOCOCCUS CAPITIS  Blood Culture ID Panel (Reflexed)     Status: Abnormal   Collection Time: 11/20/20  1:53 PM  Result Value Ref Range Status   Enterococcus faecalis NOT DETECTED NOT DETECTED Final   Enterococcus Faecium NOT DETECTED NOT DETECTED Final   Listeria monocytogenes NOT DETECTED NOT DETECTED Final    Staphylococcus species DETECTED (A) NOT DETECTED Final    Comment: CRITICAL RESULT CALLED TO, READ BACK BY AND VERIFIED WITH: PHARMD ALEX L 1308 [id number] FCP    Staphylococcus aureus (BCID) NOT DETECTED NOT DETECTED Final   Staphylococcus epidermidis NOT DETECTED NOT DETECTED Final   Staphylococcus lugdunensis NOT DETECTED NOT DETECTED Final   Streptococcus species NOT DETECTED NOT DETECTED Final   Streptococcus agalactiae NOT DETECTED NOT DETECTED Final   Streptococcus pneumoniae NOT DETECTED NOT DETECTED Final   Streptococcus pyogenes NOT DETECTED NOT DETECTED Final   A.calcoaceticus-baumannii NOT DETECTED NOT DETECTED Final   Bacteroides fragilis NOT DETECTED NOT DETECTED Final   Enterobacterales NOT DETECTED NOT DETECTED Final   Enterobacter cloacae complex NOT DETECTED NOT DETECTED Final   Escherichia coli NOT DETECTED NOT DETECTED Final   Klebsiella aerogenes NOT DETECTED NOT DETECTED Final   Klebsiella oxytoca NOT DETECTED NOT DETECTED Final   Klebsiella pneumoniae NOT DETECTED NOT DETECTED Final   Proteus species NOT DETECTED NOT DETECTED Final   Salmonella species NOT DETECTED NOT DETECTED Final  Serratia marcescens NOT DETECTED NOT DETECTED Final   Haemophilus influenzae NOT DETECTED NOT DETECTED Final   Neisseria meningitidis NOT DETECTED NOT DETECTED Final   Pseudomonas aeruginosa NOT DETECTED NOT DETECTED Final   Stenotrophomonas maltophilia NOT DETECTED NOT DETECTED Final   Candida albicans NOT DETECTED NOT DETECTED Final   Candida auris NOT DETECTED NOT DETECTED Final   Candida glabrata NOT DETECTED NOT DETECTED Final   Candida krusei NOT DETECTED NOT DETECTED Final   Candida parapsilosis NOT DETECTED NOT DETECTED Final   Candida tropicalis NOT DETECTED NOT DETECTED Final   Cryptococcus neoformans/gattii NOT DETECTED NOT DETECTED Final    Comment: Performed at [facility], [place] [address] [place], [place] [address]  Culture, blood (Routine X 2) w Reflex to ID  Panel     Status: None (Preliminary result)   Collection Time: 11/21/20  3:36 PM   Specimen: BLOOD LEFT HAND  Result Value Ref Range Status   Specimen Description BLOOD LEFT HAND  Final   Special Requests   Final    BOTTLES DRAWN AEROBIC AND ANAEROBIC Blood Culture adequate volume   Culture   Final    NO GROWTH 3 DAYS Performed at [facility], [place] [address] [place], [place] [address]    Report Status PENDING  Incomplete  Culture, blood (Routine X 2) w Reflex to ID Panel     Status: None (Preliminary result)   Collection Time: 11/21/20  3:48 PM   Specimen: BLOOD RIGHT ARM  Result Value Ref Range Status   Specimen Description BLOOD RIGHT ARM  Final   Special Requests   Final    BOTTLES DRAWN AEROBIC AND ANAEROBIC Blood Culture adequate volume   Culture   Final    NO GROWTH 3 DAYS Performed at [facility], [place] [address], [place], [place] [address]    Report Status PENDING  Incomplete     Labs:  COVID-19 Labs  No results for input(s): DDIMER, FERRITIN, LDH, CRP in the last 72 hours.  Lab Results  Component Value Date   [place] NEGATIVE 11/19/2020      Basic Metabolic Panel: Recent Labs  Lab 11/19/20 1107 11/20/20 0344 11/21/20 0304 11/22/20 0227 11/23/20 0039  NA 132* 137 140 137 136  K 3.5 3.3* 3.6 4.1 3.7  CL 102 104 103 104 107  CO2 20* 21* '24 23 22  ' GLUCOSE 312* 403* 184* 350* 323*  BUN '12 16 23 23 21  ' CREATININE 1.21* 1.16* 1.28* 1.24* 1.24*  CALCIUM 8.6* 8.5* 9.0 8.9 8.7*  MG  --   --  1.6*  --   --    Liver Function Tests: Recent Labs  Lab 11/20/20 0344  AST 26  ALT 29  ALKPHOS 53  BILITOT 0.4  PROT 6.7  ALBUMIN 2.7*    CBC: Recent Labs  Lab 11/19/20 1107 11/20/20 0344 11/22/20 0227  WBC 10.2 7.6 10.0  HGB 11.6* 11.1* 11.2*  HCT 34.8* 33.5* 33.5*  MCV 92.1 91.0 90.5  PLT 322 313 394    BNP: BNP (last 3 results) Recent Labs    11/19/20 1300 11/20/20 0344  BNP 126.5* 231.1*    ProBNP (last 3  results) Recent Labs    10/24/20 1348  PROBNP 240    CBG: Recent Labs  Lab 11/22/20 1216 11/22/20 1601 11/22/20 2117 11/23/20 0744 11/23/20 1223  GLUCAP 276* 221* 323* 200* 194*     IMAGING STUDIES DG Chest 2 View  Result Date: 11/19/2020 CLINICAL  DATA:  Chest x-ray dated February 06, 2018 EXAM: CHEST - 2 VIEW COMPARISON:  None. FINDINGS: Cardiac and mediastinal contours are within normal limits. Trace bilateral pleural effusions and bibasilar atelectasis.More focal nodular opacity is seen adjacent to the left diaphragm on lateral view. No evidence of pneumothorax. IMPRESSION: New trace bilateral pleural effusions and atelectasis. More focal nodular opacity is seen adjacent to the left diaphragm on lateral view, possibly an additional area of atelectasis. Recommend follow-up PA and lateral chest x-ray 4-6 weeks to ensure resolution. Electronically Signed   By: Yetta Glassman M.D.   On: 11/19/2020 12:30   CT Angio Chest PE W/Cm &/Or Wo Cm  Result Date: 11/19/2020 CLINICAL DATA:  PE suspected, high prob EXAM: CT ANGIOGRAPHY CHEST WITH CONTRAST TECHNIQUE: Multidetector CT imaging of the chest was performed using the standard protocol during bolus administration of intravenous contrast. Multiplanar CT image reconstructions and MIPs were obtained to evaluate the vascular anatomy. CONTRAST:  64m OMNIPAQUE IOHEXOL 350 MG/ML SOLN COMPARISON:  None. FINDINGS: Cardiovascular: Satisfactory opacification of the pulmonary arteries to the segmental level. No evidence of pulmonary embolism. The thoracic aorta is normal in caliber. Normal heart size. No pericardial effusion. Mediastinum/Nodes: No pathologically enlarged mediastinal, hilar, or axillary lymph nodes. Prominent AP window lymph node measures 7 mm, within normal limits. The thyroid gland appears normal. Lungs/Pleura: No pleural effusion. No pneumothorax. Subsegmental atelectasis in the dependent aspects of the lungs. No suspicious pulmonary  nodules. Musculoskeletal: No aggressive osseous lesions. Upper abdomen: The visualized upper abdomen is unremarkable. Review of the MIP images confirms the above findings. IMPRESSION: No evidence of pulmonary embolism. Subsegmental atelectasis in the dependent aspects of the lungs without other acute findings in the chest. Electronically Signed   By: YAlbin FellingM.D.   On: 11/19/2020 14:50   DG Chest Port 1 View  Result Date: 11/21/2020 CLINICAL DATA:  Abnormal respiration. EXAM: PORTABLE CHEST 1 VIEW COMPARISON:  11/19/2020 FINDINGS: 0629 hours. The lungs are clear without focal pneumonia, edema, pneumothorax or pleural effusion. Basilar atelectasis noted bilaterally. Cardiopericardial silhouette is at upper limits of normal for size. The visualized bony structures of the thorax show no acute abnormality. Telemetry leads overlie the chest. IMPRESSION: Basilar atelectasis.  No other acute cardiopulmonary finding. Electronically Signed   By: EMisty StanleyM.D.   On: 11/21/2020 07:01   ECHOCARDIOGRAM COMPLETE  Result Date: 11/10/2020    ECHOCARDIOGRAM REPORT   Patient Name:   TLAURE LEONEDate of Exam: 11/10/2020 Medical Rec #:  [id number]    Height:       66.0 in Accession #:    [id number]   Weight:       154.0 lb Date of Birth:  4Jan 12, 1934    BSA:          1.790 m Patient Age:    89years      BP:           126/74 mmHg Patient Gender: F             HR:           127 bpm. Exam Location:  [address] Procedure: 2D Echo, Cardiac Doppler and Color Doppler Indications:    R06.00 SOB  History:        Patient has prior history of Echocardiogram examinations, most                 recent 07/05/2019. CHF, Arrythmias:LBBB, Signs/Symptoms:Shortness  of Breath; Risk Factors:Hypertension, Diabetes and Dyslipidemia.  Sonographer:    Coralyn Helling RDCS Referring Phys: [place]  1. Left ventricular ejection fraction, by estimation, is 50 to 55%. The left ventricle has low normal  function. There is mild left ventricular hypertrophy. Left ventricular diastolic parameters are indeterminate.  2. Right ventricular systolic function is normal. The right ventricular size is normal.  3. Trivial mitral valve regurgitation.  4. The aortic valve is normal in structure. Aortic valve regurgitation is not visualized. Comparison(s): The left ventricular function is unchanged. FINDINGS  Left Ventricle: Left ventricular ejection fraction, by estimation, is 50 to 55%. The left ventricle has low normal function. The left ventricular internal cavity size was normal in size. There is mild left ventricular hypertrophy. Left ventricular diastolic parameters are indeterminate. Right Ventricle: The right ventricular size is normal. Right vetricular wall thickness was not assessed. Right ventricular systolic function is normal. Left Atrium: Left atrial size was normal in size. Right Atrium: Right atrial size was normal in size. Pericardium: There is no evidence of pericardial effusion. Mitral Valve: There is mild thickening of the mitral valve leaflet(s). Trivial mitral valve regurgitation. Tricuspid Valve: The tricuspid valve is normal in structure. Tricuspid valve regurgitation is mild. Aortic Valve: The aortic valve is normal in structure. Aortic valve regurgitation is not visualized. Pulmonic Valve: The pulmonic valve was normal in structure. Pulmonic valve regurgitation is trivial. Aorta: The aortic root and ascending aorta are structurally normal, with no evidence of dilitation. IAS/Shunts: No atrial level shunt detected by color flow Doppler.  LEFT VENTRICLE PLAX 2D LVIDd:         3.60 cm  Diastology LVIDs:         2.70 cm  LV e' medial:    3.48 cm/s LV PW:         1.30 cm  LV E/e' medial:  19.7 LV IVS:        1.20 cm  LV e' lateral:   4.46 cm/s LVOT diam:     1.70 cm  LV E/e' lateral: 15.4 LV SV:         53 LV SV Index:   30 LVOT Area:     2.27 cm                          3D Volume EF:                          3D EF:        51 %                         LV EDV:       105 ml                         LV ESV:       52 ml                         LV SV:        54 ml RIGHT VENTRICLE             IVC RV S prime:     15.40 cm/s  IVC diam: 0.90 cm TAPSE (M-mode): 2.2 cm RVSP:           26.4 mmHg LEFT ATRIUM  Index       RIGHT ATRIUM           Index LA diam:        3.40 cm 1.90 cm/m  RA Pressure: 3.00 mmHg LA Vol (A2C):   44.7 ml 24.98 ml/m RA Area:     12.80 cm LA Vol (A4C):   46.7 ml 26.10 ml/m RA Volume:   30.70 ml  17.16 ml/m LA Biplane Vol: 49.3 ml 27.55 ml/m  AORTIC VALVE LVOT Vmax:   94.00 cm/s LVOT Vmean:  64.800 cm/s LVOT VTI:    0.235 m  AORTA Ao Root diam: 3.20 cm Ao Asc diam:  3.20 cm MITRAL VALVE               TRICUSPID VALVE MV Area (PHT): 2.50 cm    TR Peak grad:   23.4 mmHg MV Decel Time: 304 msec    TR Vmax:        242.00 cm/s MV E velocity: 68.60 cm/s  Estimated RAP:  3.00 mmHg MV A velocity: 91.70 cm/s  RVSP:           26.4 mmHg MV E/A ratio:  0.75                            SHUNTS                            Systemic VTI:  0.24 m                            Systemic Diam: 1.70 cm Dorris Carnes MD Electronically signed by Dorris Carnes MD Signature Date/Time: 11/10/2020/7:00:09 PM    Final     DISCHARGE EXAMINATION: Vitals:   11/22/20 1921 11/23/20 0409 11/23/20 0755 11/23/20 1225  BP: (!) 143/60 129/69  (!) 133/52  Pulse: 74 73  77  Resp: '18 16  17  ' Temp: 98.4 F (36.9 C) 98.4 F (36.9 C)  98 F (36.7 C)  TempSrc: Oral Oral  Oral  SpO2: 94% 96% 97% 96%  Weight:  70.3 kg    Height:       General appearance: Awake alert.  In no distress Resp: Coarse breath sound bilaterally.  Normal effort at rest.  No wheezing or rhonchi.  No definite crackles Cardio: S1-S2 is normal regular.  No S3-S4.  No rubs murmurs or bruit GI: Abdomen is soft.  Nontender nondistended.  Bowel sounds are present normal.  No masses organomegaly    DISPOSITION: Home  Discharge Instructions     (HEART FAILURE  PATIENTS) Call MD:  Anytime you have any of the following symptoms: 1) 3 pound weight gain in 24 hours or 5 pounds in 1 week 2) shortness of breath, with or without a dry hacking cough 3) swelling in the hands, feet or stomach 4) if you have to sleep on extra pillows at night in order to breathe.   Complete by: As directed    Ambulatory referral to Pulmonology   Complete by: As directed    Concern for aspiration from GERD. Seen by Dr. Carlis Abbott in the hospital.   Reason for referral: Other   Call MD for:  difficulty breathing, headache or visual disturbances   Complete by: As directed    Call MD for:  extreme fatigue   Complete by: As directed    Call MD for:  persistant dizziness or light-headedness   Complete by: As directed    Call MD for:  persistant nausea and vomiting   Complete by: As directed    Call MD for:  severe uncontrolled pain   Complete by: As directed    Call MD for:  temperature >100.4   Complete by: As directed    Diet Carb Modified   Complete by: As directed    Discharge instructions   Complete by: As directed    Please take your medications as prescribed.  A referral has been sent to the pulmonology office for follow-up.  Please be sure to follow-up with your primary care provider by next week.  You were cared for by a hospitalist during your hospital stay. If you have any questions about your discharge medications or the care you received while you were in the hospital after you are discharged, you can call the unit and asked to speak with the hospitalist on call if the hospitalist that took care of you is not available. Once you are discharged, your primary care physician will handle any further medical issues. Please note that NO REFILLS for any discharge medications will be authorized once you are discharged, as it is imperative that you return to your primary care physician (or establish a relationship with a primary care physician if you do not have one) for your  aftercare needs so that they can reassess your need for medications and monitor your lab values. If you do not have a primary care physician, you can call [phone] for a physician referral.   Increase activity slowly   Complete by: As directed          Allergies as of 11/23/2020       Reactions   Aspirin Other (See Comments)   Barbiturates Other (See Comments)   Codeine Nausea And Vomiting   Januvia [sitagliptin] Nausea And Vomiting   Meperidine Hcl Other (See Comments)   Other Nausea And Vomiting   *all narcotic meds*   Penicillins Hives   Has patient had a PCN reaction causing immediate rash, facial/tongue/throat swelling, SOB or lightheadedness with hypotension: Yes Has patient had a PCN reaction causing severe rash involving mucus membranes or skin necrosis: No Has patient had a PCN reaction that required hospitalization: No Has patient had a PCN reaction occurring within the last 10 years: No If all of the above answers are "NO", then may proceed with Cephalosporin use.   Ramipril Other (See Comments)   Sulfonamide Derivatives Hives        Medication List     STOP taking these medications    aspirin 81 MG EC tablet       TAKE these medications    acetaminophen 650 MG CR tablet Commonly known as: TYLENOL Take 650-1,300 mg by mouth 2 (two) times daily as needed for pain.   albuterol 108 (90 Base) MCG/ACT inhaler Commonly known as: VENTOLIN HFA Inhale 2 puffs into the lungs every 6 (six) hours as needed for shortness of breath or wheezing.   ALPRAZolam 0.5 MG tablet Commonly known as: XANAX Take 0.5 mg by mouth at bedtime.   atorvastatin 10 MG tablet Commonly known as: LIPITOR Take 10 mg by mouth at bedtime.   benzonatate 100 MG capsule Commonly known as: TESSALON Take 1 capsule (100 mg total) by mouth 3 (three) times daily as needed for cough.   cyclobenzaprine 5 MG tablet Commonly known as: FLEXERIL Take 5 mg by mouth every 8 (eight) hours  as needed  for muscle spasms.   desonide 0.05 % cream Commonly known as: DESOWEN Apply 1 application topically 2 (two) times daily as needed (for itching/dry skin).   Entresto 49-51 MG Generic drug: sacubitril-valsartan Take 1 tablet by mouth twice daily   Eysuvis 0.25 % Susp Generic drug: Loteprednol Etabonate Place 1 drop into both eyes in the morning, at noon, in the evening, and at bedtime.   ipratropium-albuterol 0.5-2.5 (3) MG/3ML Soln Commonly known as: DUONEB Take 3 mLs by nebulization every 6 (six) hours as needed.   levothyroxine 75 MCG tablet Commonly known as: SYNTHROID Take 0.5 tablets (37.5 mcg total) by mouth daily before breakfast. What changed:  medication strength how much to take when to take this   meclizine 25 MG tablet Commonly known as: ANTIVERT Take 1 tablet (25 mg total) by mouth 3 (three) times daily as needed for dizziness.   metFORMIN 1000 MG tablet Commonly known as: Glucophage Take 1 tablet (1,000 mg total) by mouth 2 (two) times daily.   metroNIDAZOLE 0.75 % cream Commonly known as: METROCREAM Apply 1 application topically daily as needed (to all itchy sites- as directed).   metroNIDAZOLE 500 MG tablet Commonly known as: FLAGYL Take 1 tablet (500 mg total) by mouth every 8 (eight) hours for 3 days.   mometasone-formoterol 100-5 MCG/ACT Aero Commonly known as: DULERA Inhale 2 puffs into the lungs 2 (two) times daily.   nitroGLYCERIN 0.4 MG SL tablet Commonly known as: NITROSTAT Place 1 tablet (0.4 mg total) under the tongue every 5 (five) minutes as needed for chest pain.   ondansetron 4 MG tablet Commonly known as: ZOFRAN Take 1 tablet (4 mg total) by mouth every 8 (eight) hours as needed for nausea or vomiting.   OneTouch Verio Flex System w/Device Kit 1 each by Other route. Check blood sugar every morning.   OneTouch Verio test strip Generic drug: glucose blood 1 each by Other route. Check blood sugar every morning   pantoprazole 40  MG tablet Commonly known as: PROTONIX Take 40 mg by mouth 2 (two) times daily.   predniSONE 10 MG tablet Commonly known as: DELTASONE Take 3 tablets once daily for 3 days followed by 2 tablets once daily for 3 days followed by 1 tablet once daily for 3 days and then stop   Systane Ultra 0.4-0.3 % Soln Generic drug: Polyethyl Glycol-Propyl Glycol Place 2 drops into both eyes at bedtime.          Follow-up Information     Ginger Organ., MD Follow up in 1 week(s).   Specialty: Internal Medicine Contact information: [place] [address] [phone]         Care, [facility] Follow up.   Specialty: Home Health Services Why: for home health services. they will call you in 2-3 days to set up your first home visit Contact information: [address] [place] [address] [phone]                 TOTAL DISCHARGE TIME: 35 minutes  [place]  [facility] Pager on [email or web address]  11/24/2020, 12:46 PM

## 2020-11-23 NOTE — TOC Transition Note (Signed)
Transition of Care [facility]) - CM/SW Discharge Note   Patient Details  Name: Kayla Medina MRN: [id number] Date of Birth: [birth year]  Transition of Care [facility]) CM/SW Contact:  Carles Collet, RN Phone Number: 11/23/2020, 10:34 AM   Clinical Narrative:     Could not reach patient by phone. LVM for daughter Elmyra Ricks. Spoke w daughter Delene Loll who will relay DC plan to her sister. Venita aware that nebulizer has been ordered and will be delivered to the room prior to DC.  No preference for [facility] agency, referral accepted by [facility], who will call Elmyra Ricks to set up first home visit  McPhail,Nicole Daughter [id number]  [phone]  Hart,Venita Daughter   [phone]    Final next level of care: Home w Home Health Services Barriers to Discharge: No Barriers Identified   Patient Goals and CMS Choice Patient states their goals for this hospitalization and ongoing recovery are:: to go home CMS Medicare.gov Compare Post Acute Care list provided to:: Other (Comment Required) Choice offered to / list presented to : Adult Children  Discharge Placement                       Discharge Plan and Services                DME Arranged: Nebulizer/meds DME Agency: [employer] Date DME Agency Contacted: 11/23/20 Time DME Agency Contacted: [phone] Representative spoke with at DME Agency: Delana Meyer HH Arranged: PT, OT [place] Agency: [place] Date [place]: 11/23/20 Time [place]: 1034 Representative spoke with at [place]: Alvis Lemmings  Social Determinants of Health ([place]) Interventions     Readmission Risk Interventions No flowsheet data found.

## 2020-11-26 LAB — CULTURE, BLOOD (ROUTINE X 2)
Culture: NO GROWTH
Culture: NO GROWTH
Special Requests: ADEQUATE
Special Requests: ADEQUATE

## 2020-12-05 ENCOUNTER — Other Ambulatory Visit: Payer: Self-pay

## 2020-12-05 ENCOUNTER — Encounter: Payer: Self-pay | Admitting: Pulmonary Disease

## 2020-12-05 ENCOUNTER — Ambulatory Visit: Payer: Medicare Other | Admitting: Pulmonary Disease

## 2020-12-05 VITALS — BP 118/76 | HR 92 | Ht 66.0 in | Wt 140.0 lb

## 2020-12-05 DIAGNOSIS — R531 Weakness: Secondary | ICD-10-CM | POA: Diagnosis not present

## 2020-12-05 LAB — CBC WITH DIFFERENTIAL/PLATELET
Basophils Absolute: 0.1 10*3/uL (ref 0.0–0.1)
Basophils Relative: 1 % (ref 0.0–3.0)
Eosinophils Absolute: 0.1 10*3/uL (ref 0.0–0.7)
Eosinophils Relative: 1.6 % (ref 0.0–5.0)
HCT: 38.9 % (ref 36.0–46.0)
Hemoglobin: 12.8 g/dL (ref 12.0–15.0)
Lymphocytes Relative: 29.7 % (ref 12.0–46.0)
Lymphs Abs: 2.4 10*3/uL (ref 0.7–4.0)
MCHC: 32.9 g/dL (ref 30.0–36.0)
MCV: 92 fl (ref 78.0–100.0)
Monocytes Absolute: 0.8 10*3/uL (ref 0.1–1.0)
Monocytes Relative: 10.2 % (ref 3.0–12.0)
Neutro Abs: 4.6 10*3/uL (ref 1.4–7.7)
Neutrophils Relative %: 57.5 % (ref 43.0–77.0)
Platelets: 305 10*3/uL (ref 150.0–400.0)
RBC: 4.23 Mil/uL (ref 3.87–5.11)
RDW: 14.5 % (ref 11.5–15.5)
WBC: 8 10*3/uL (ref 4.0–10.5)

## 2020-12-05 NOTE — Progress Notes (Addendum)
'@Patient'  ID: Kayla Medina, female    DOB: Mar 04, 1933, 87 y.o.   MRN: [id number]  Chief Complaint  Patient presents with   Hospitalization Follow-up    States her cough has improved since she has been home but she does not have energy. She notices the SOB when she attempts to do anything. Chest feels heavy.     Referring provider: Bonnielee Haff, MD  HPI:   87 y.o. woman whom we are seeing in follow-up from recent hospitalization for cough and dyspnea.  Pulmonary consult note x3 reviewed.  Discharge summary reviewed.  She reports feeling ill about 1 week prior to hospitalization.  A lot of cough.  Severe fatigue.  Not much energy.  No appetite.  Did not eat much.  About a week into the hospitalization, she was "forced" to go to the hospital by her family members.  Chest x-ray on my review on 11/19/20 shows clear lungs with streaky atelectasis in bilateral bases on my interpretation.  There is concern for PE.  CTA PE protocol was obtained and showed clear lungs with exception of faint dependent groundglass infiltrates in bilateral lower lobes consistent with atelectasis on my interpretation.  Procalcitonin was negative.  She denied any nausea or vomiting.  No coughing with eating or drinking.  There is concern for aspiration at the time in the hospital.  She was placed on antibiotics and steroids.  Her cough is improved.  Her sugars have gone to 400+ in the short-term.  Eating under better control as steroid course is over.  She endorses extreme fatigue and extreme dyspnea on exertion.  Cannot walk very far.  She is in a wheelchair today.  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  She feels very discouraged.  Met her self.  She is doing physical therapy at home twice a week.  There for about 2 hours.  I gave her exercises to do but she struggles to complete them.  She is a never smoker.  Questionaires / Pulmonary Flowsheets:   ACT:  No flowsheet data  found.  MMRC: No flowsheet data found.  Epworth:  No flowsheet data found.  Tests:   FENO:  No results found for: NITRICOXIDE  PFT: No flowsheet data found.  WALK:  No flowsheet data found.  Imaging: Personally reviewed and as per EMR DG Chest 2 View  Result Date: 11/19/2020 CLINICAL DATA:  Chest x-ray dated February 06, 2018 EXAM: CHEST - 2 VIEW COMPARISON:  None. FINDINGS: Cardiac and mediastinal contours are within normal limits. Trace bilateral pleural effusions and bibasilar atelectasis.More focal nodular opacity is seen adjacent to the left diaphragm on lateral view. No evidence of pneumothorax. IMPRESSION: New trace bilateral pleural effusions and atelectasis. More focal nodular opacity is seen adjacent to the left diaphragm on lateral view, possibly an additional area of atelectasis. Recommend follow-up PA and lateral chest x-ray 4-6 weeks to ensure resolution. Electronically Signed   By: Yetta Glassman M.D.   On: 11/19/2020 12:30   CT Angio Chest PE W/Cm &/Or Wo Cm  Result Date: 11/19/2020 CLINICAL DATA:  PE suspected, high prob EXAM: CT ANGIOGRAPHY CHEST WITH CONTRAST TECHNIQUE: Multidetector CT imaging of the chest was performed using the standard protocol during bolus administration of intravenous contrast. Multiplanar CT image reconstructions and MIPs were obtained to evaluate the vascular anatomy. CONTRAST:  58m OMNIPAQUE IOHEXOL 350 MG/ML SOLN COMPARISON:  None. FINDINGS: Cardiovascular: Satisfactory opacification of the pulmonary arteries to the segmental  level. No evidence of pulmonary embolism. The thoracic aorta is normal in caliber. Normal heart size. No pericardial effusion. Mediastinum/Nodes: No pathologically enlarged mediastinal, hilar, or axillary lymph nodes. Prominent AP window lymph node measures 7 mm, within normal limits. The thyroid gland appears normal. Lungs/Pleura: No pleural effusion. No pneumothorax. Subsegmental atelectasis in the dependent aspects  of the lungs. No suspicious pulmonary nodules. Musculoskeletal: No aggressive osseous lesions. Upper abdomen: The visualized upper abdomen is unremarkable. Review of the MIP images confirms the above findings. IMPRESSION: No evidence of pulmonary embolism. Subsegmental atelectasis in the dependent aspects of the lungs without other acute findings in the chest. Electronically Signed   By: Albin Felling M.D.   On: 11/19/2020 14:50   DG Chest Port 1 View  Result Date: 11/21/2020 CLINICAL DATA:  Abnormal respiration. EXAM: PORTABLE CHEST 1 VIEW COMPARISON:  11/19/2020 FINDINGS: 0629 hours. The lungs are clear without focal pneumonia, edema, pneumothorax or pleural effusion. Basilar atelectasis noted bilaterally. Cardiopericardial silhouette is at upper limits of normal for size. The visualized bony structures of the thorax show no acute abnormality. Telemetry leads overlie the chest. IMPRESSION: Basilar atelectasis.  No other acute cardiopulmonary finding. Electronically Signed   By: Misty Stanley M.D.   On: 11/21/2020 07:01   MR ABDOMEN MRCP W WO CONTAST  Result Date: 11/05/2020 CLINICAL DATA:  Follow up abdominal ultrasound EXAM: MRI ABDOMEN WITHOUT AND WITH CONTRAST (INCLUDING MRCP) TECHNIQUE: Multiplanar multisequence MR imaging of the abdomen was performed both before and after the administration of intravenous contrast. Heavily T2-weighted images of the biliary and pancreatic ducts were obtained, and three-dimensional MRCP images were rendered by post processing. CONTRAST:  48m MULTIHANCE GADOBENATE DIMEGLUMINE 529 MG/ML IV SOLN COMPARISON:  Ultrasound abdomen dated September 24, 2020 FINDINGS: Lower chest: No acute findings. Hepatobiliary: No suspicious liver lesions. Hydropic gallbladder with no evidence of cholecystitis. Dilated common bile duct which smoothly tapers to the ampulla. No biliary filling defects. Minimal prominence of the intrahepatic bile ducts. Pancreas: Cystic lesions of the pancreatic head  and body measuring 7 mm and 4 mm respectively located on image 22 and 21 on series 4. Normal caliber main pancreatic duct. Spleen:  Within normal limits in size and appearance. Adrenals/Urinary Tract: Adrenal glands are unremarkable. Numerous T2 hyperintense lesions are seen in the bilateral kidneys, largest are compatible with simple cysts, others are too small to completely characterize. Kidneys enhance symmetrically with no evidence of hydronephrosis Stomach/Bowel: Visualized portions within the abdomen are unremarkable. Vascular/Lymphatic: No pathologically enlarged lymph nodes identified. No abdominal aortic aneurysm demonstrated. Other:  None. Musculoskeletal: No suspicious bone lesions identified. IMPRESSION: Dilated common bile duct, measuring up to 9 mm. No evidence of choledocholithiasis. Hydropic gallbladder.  No cholelithiasis. Small cystic lesions of the pancreatic head and tail, likely intraductal papillary mucinous neoplasms. Recommend follow up pre and post contrast MRI/MRCP or pancreatic protocol CT in 2 years. This recommendation follows ACR consensus guidelines: Management of Incidental Pancreatic Cysts: A White Paper of the ACR Incidental Findings Committee. [id number] Electronically Signed   By: LYetta GlassmanM.D.   On: 11/05/2020 11:54   ECHOCARDIOGRAM COMPLETE  Result Date: 11/10/2020    ECHOCARDIOGRAM REPORT   Patient Name:   TDAQUANA PADDOCKDate of Exam: 11/10/2020 Medical Rec #:  [id number]    Height:       66.0 in Accession #:    [id number]   Weight:       154.0 lb Date of Birth:  [birth year]  BSA:          1.790 m Patient Age:    61 years      BP:           126/74 mmHg Patient Gender: F             HR:           127 bpm. Exam Location:  [facility] Procedure: 2D Echo, Cardiac Doppler and Color Doppler Indications:    R06.00 SOB  History:        Patient has prior history of Echocardiogram examinations, most                 recent 07/05/2019. CHF,  Arrythmias:LBBB, Signs/Symptoms:Shortness                 of Breath; Risk Factors:Hypertension, Diabetes and Dyslipidemia.  Sonographer:    Coralyn Helling RDCS Referring Phys: [place]  1. Left ventricular ejection fraction, by estimation, is 50 to 55%. The left ventricle has low normal function. There is mild left ventricular hypertrophy. Left ventricular diastolic parameters are indeterminate.  2. Right ventricular systolic function is normal. The right ventricular size is normal.  3. Trivial mitral valve regurgitation.  4. The aortic valve is normal in structure. Aortic valve regurgitation is not visualized. Comparison(s): The left ventricular function is unchanged. FINDINGS  Left Ventricle: Left ventricular ejection fraction, by estimation, is 50 to 55%. The left ventricle has low normal function. The left ventricular internal cavity size was normal in size. There is mild left ventricular hypertrophy. Left ventricular diastolic parameters are indeterminate. Right Ventricle: The right ventricular size is normal. Right vetricular wall thickness was not assessed. Right ventricular systolic function is normal. Left Atrium: Left atrial size was normal in size. Right Atrium: Right atrial size was normal in size. Pericardium: There is no evidence of pericardial effusion. Mitral Valve: There is mild thickening of the mitral valve leaflet(s). Trivial mitral valve regurgitation. Tricuspid Valve: The tricuspid valve is normal in structure. Tricuspid valve regurgitation is mild. Aortic Valve: The aortic valve is normal in structure. Aortic valve regurgitation is not visualized. Pulmonic Valve: The pulmonic valve was normal in structure. Pulmonic valve regurgitation is trivial. Aorta: The aortic root and ascending aorta are structurally normal, with no evidence of dilitation. IAS/Shunts: No atrial level shunt detected by color flow Doppler.  LEFT VENTRICLE PLAX 2D LVIDd:         3.60 cm  Diastology  LVIDs:         2.70 cm  LV e' medial:    3.48 cm/s LV PW:         1.30 cm  LV E/e' medial:  19.7 LV IVS:        1.20 cm  LV e' lateral:   4.46 cm/s LVOT diam:     1.70 cm  LV E/e' lateral: 15.4 LV SV:         53 LV SV Index:   30 LVOT Area:     2.27 cm                          3D Volume EF:                         3D EF:        51 %  LV EDV:       105 ml                         LV ESV:       52 ml                         LV SV:        54 ml RIGHT VENTRICLE             IVC RV S prime:     15.40 cm/s  IVC diam: 0.90 cm TAPSE (M-mode): 2.2 cm RVSP:           26.4 mmHg LEFT ATRIUM             Index       RIGHT ATRIUM           Index LA diam:        3.40 cm 1.90 cm/m  RA Pressure: 3.00 mmHg LA Vol (A2C):   44.7 ml 24.98 ml/m RA Area:     12.80 cm LA Vol (A4C):   46.7 ml 26.10 ml/m RA Volume:   30.70 ml  17.16 ml/m LA Biplane Vol: 49.3 ml 27.55 ml/m  AORTIC VALVE LVOT Vmax:   94.00 cm/s LVOT Vmean:  64.800 cm/s LVOT VTI:    0.235 m  AORTA Ao Root diam: 3.20 cm Ao Asc diam:  3.20 cm MITRAL VALVE               TRICUSPID VALVE MV Area (PHT): 2.50 cm    TR Peak grad:   23.4 mmHg MV Decel Time: 304 msec    TR Vmax:        242.00 cm/s MV E velocity: 68.60 cm/s  Estimated RAP:  3.00 mmHg MV A velocity: 91.70 cm/s  RVSP:           26.4 mmHg MV E/A ratio:  0.75                            SHUNTS                            Systemic VTI:  0.24 m                            Systemic Diam: 1.70 cm Dorris Carnes MD Electronically signed by Dorris Carnes MD Signature Date/Time: 11/10/2020/7:00:09 PM    Final     Lab Results: Personally reviewed and as per EMR CBC    Component Value Date/Time   WBC 10.0 11/22/2020 0227   RBC 3.70 (L) 11/22/2020 0227   HGB 11.2 (L) 11/22/2020 0227   HGB 12.2 10/24/2020 1348   HCT 33.5 (L) 11/22/2020 0227   HCT 36.8 10/24/2020 1348   PLT 394 11/22/2020 0227   PLT 263 10/24/2020 1348   MCV 90.5 11/22/2020 0227   MCV 93 10/24/2020 1348   MCH 30.3 11/22/2020 0227   MCHC  33.4 11/22/2020 0227   RDW 12.9 11/22/2020 0227   RDW 12.5 10/24/2020 1348   LYMPHSABS 2.3 04/25/2014 0500   MONOABS 1.1 (H) 04/25/2014 0500   EOSABS 0.3 04/25/2014 0500   BASOSABS 0.0 04/25/2014 0500    BMET    Component Value Date/Time   NA 136 11/23/2020 0039   NA 140  10/24/2020 1348   K 3.7 11/23/2020 0039   CL 107 11/23/2020 0039   CO2 22 11/23/2020 0039   GLUCOSE 323 (H) 11/23/2020 0039   BUN 21 11/23/2020 0039   BUN 20 10/24/2020 1348   CREATININE 1.24 (H) 11/23/2020 0039   CREATININE 1.15 (H) 03/09/2016 0813   CALCIUM 8.7 (L) 11/23/2020 0039   GFRNONAA 42 (L) 11/23/2020 0039   GFRAA 61 01/16/2020 0854    BNP    Component Value Date/Time   BNP 231.1 (H) 11/20/2020 0344    ProBNP    Component Value Date/Time   PROBNP 240 10/24/2020 1348    Specialty Problems       Pulmonary Problems   DYSPNEA    Qualifier: Diagnosis of  By: Mare Ferrari, RMA, Sherri        Dyspnea   Dyspnea on exertion    Allergies  Allergen Reactions   Aspirin Other (See Comments)   Barbiturates Other (See Comments)   Codeine Nausea And Vomiting   Januvia [Sitagliptin] Nausea And Vomiting   Meperidine Hcl Other (See Comments)   Other Nausea And Vomiting    *all narcotic meds*   Penicillins Hives    Has patient had a PCN reaction causing immediate rash, facial/tongue/throat swelling, SOB or lightheadedness with hypotension: Yes Has patient had a PCN reaction causing severe rash involving mucus membranes or skin necrosis: No Has patient had a PCN reaction that required hospitalization: No Has patient had a PCN reaction occurring within the last 10 years: No If all of the above answers are "NO", then may proceed with Cephalosporin use.   Ramipril Other (See Comments)   Sulfonamide Derivatives Hives    Immunization History  Administered Date(s) Administered   Influenza, High Dose Seasonal PF 12/09/2016   PFIZER(Purple Top)SARS-COV-2 Vaccination 04/30/2019, 05/24/2019   Tdap  12/26/2018    Past Medical History:  Diagnosis Date   Anal fissure    Ataxia 06/01/2013   Chest pain, unspecified    Chronic systolic CHF (congestive heart failure) ([facility]) 09/10/2014   Diabetes mellitus without complication (HCC)    Glaucoma    pt. states no-glaucoma 07/20/13   Hypertension    Obstructive thrombus 04/23/2014   Orthostasis 06/01/2013   Other and unspecified hyperlipidemia    Palpitations    Pneumonia    PVC (premature ventricular contraction)    Rosacea    Shingles    hx   Shortness of breath    Subclinical hypothyroidism    Takotsubo syndrome 05/10/2014    Tobacco History: Social History   Tobacco Use  Smoking Status Former   Packs/day: 0.01   Years: 5.00   Pack years: 0.05   Types: Cigarettes  Smokeless Tobacco Never  Tobacco Comments   Never daily tobacco use, socially only.   Counseling given: Not Answered Tobacco comments: Never daily tobacco use, socially only.   Continue to not smoke  Outpatient Encounter Medications as of 12/05/2020  Medication Sig   acetaminophen (TYLENOL) 650 MG CR tablet Take 650-1,300 mg by mouth 2 (two) times daily as needed for pain.   albuterol (VENTOLIN HFA) 108 (90 Base) MCG/ACT inhaler Inhale 2 puffs into the lungs every 6 (six) hours as needed for shortness of breath or wheezing.   ALPRAZolam (XANAX) 0.5 MG tablet Take 0.5 mg by mouth at bedtime.   atorvastatin (LIPITOR) 10 MG tablet Take 10 mg by mouth at bedtime.   benzonatate (TESSALON) 100 MG capsule Take 1 capsule (100 mg total) by mouth 3 (three) times  daily as needed for cough.   Blood Glucose Monitoring Suppl (ONETOUCH VERIO FLEX SYSTEM) w/Device KIT 1 each by Other route. Check blood sugar every morning.   cyclobenzaprine (FLEXERIL) 5 MG tablet Take 5 mg by mouth every 8 (eight) hours as needed for muscle spasms.   dapagliflozin propanediol (FARXIGA) 10 MG TABS tablet 10 mg.   desonide (DESOWEN) 0.05 % cream Apply 1 application topically 2 (two) times daily as  needed (for itching/dry skin).   ENTRESTO 49-51 MG Take 1 tablet by mouth twice daily (Patient taking differently: Take 1 tablet by mouth 2 (two) times daily.)   EYSUVIS 0.25 % SUSP Place 1 drop into both eyes in the morning, at noon, in the evening, and at bedtime.   fluconazole (DIFLUCAN) 150 MG tablet 1 tablet   ipratropium-albuterol (DUONEB) 0.5-2.5 (3) MG/3ML SOLN Take 3 mLs by nebulization every 6 (six) hours as needed.   levothyroxine (SYNTHROID) 75 MCG tablet Take 0.5 tablets (37.5 mcg total) by mouth daily before breakfast.   meclizine (ANTIVERT) 25 MG tablet Take 1 tablet (25 mg total) by mouth 3 (three) times daily as needed for dizziness.   metFORMIN (GLUCOPHAGE) 1000 MG tablet Take 1 tablet (1,000 mg total) by mouth 2 (two) times daily.   metroNIDAZOLE (METROCREAM) 0.75 % cream Apply 1 application topically daily as needed (to all itchy sites- as directed).    mometasone-formoterol (DULERA) 100-5 MCG/ACT AERO Inhale 2 puffs into the lungs 2 (two) times daily.   nitroGLYCERIN (NITROSTAT) 0.4 MG SL tablet Place 1 tablet (0.4 mg total) under the tongue every 5 (five) minutes as needed for chest pain.   nystatin (MYCOSTATIN) 100000 UNIT/ML suspension 4 mL   ondansetron (ZOFRAN) 4 MG tablet Take 1 tablet (4 mg total) by mouth every 8 (eight) hours as needed for nausea or vomiting.   ONETOUCH VERIO test strip 1 each by Other route. Check blood sugar every morning   pantoprazole (PROTONIX) 40 MG tablet Take 40 mg by mouth 2 (two) times daily.   SYSTANE ULTRA 0.4-0.3 % SOLN Place 2 drops into both eyes at bedtime.   [DISCONTINUED] predniSONE (DELTASONE) 10 MG tablet Take 3 tablets once daily for 3 days followed by 2 tablets once daily for 3 days followed by 1 tablet once daily for 3 days and then stop   No facility-administered encounter medications on file as of 12/05/2020.     Review of Systems  Review of Systems  No chest pain with exertion.  No orthopnea or PND.  No lower extremity  swelling.  Comprehensive review of systems otherwise negative. Physical Exam  BP 118/76   Pulse 92   Ht '5\' 6"'  (1.676 m)   Wt 140 lb (63.5 kg)   SpO2 97% Comment: on RA  BMI 22.60 kg/m   Wt Readings from Last 5 Encounters:  12/05/20 140 lb (63.5 kg)  11/23/20 154 lb 15.7 oz (70.3 kg)  11/12/20 153 lb 9.6 oz (69.7 kg)  10/24/20 154 lb (69.9 kg)  05/09/20 156 lb 3.2 oz (70.9 kg)    BMI Readings from Last 5 Encounters:  12/05/20 22.60 kg/m  11/23/20 25.01 kg/m  11/12/20 24.79 kg/m  10/24/20 24.86 kg/m  05/09/20 25.21 kg/m     Physical Exam General: Tired appearing, in wheelchair Eyes: EOMI, no icterus Neck: Supple, no JVP Cardiovascular: Regular rate and rhythm, no murmur Pulmonary: Clear, no crackle, no wheeze, normal work of breathing Abdomen: Nondistended, bowel sounds present MSK: No synovitis, no joint effusion Neuro: In wheelchair,  no focal deficit Psych: Depressed mood, flat affect   Assessment & Plan:   Bilateral basilar dependent infiltrates, groundglass: On CT scan recent hospitalization.  Suspect reflects atelectasis which is mild dependent edema.  Possible sequela of pneumonia given her preceding symptoms.  Exam is clear today.  Cough is improved.  No need for further work-up  Dyspnea on exertion: After 2 weeks of significant illness a week which was hospitalization discharged about 2 weeks ago.  She is lost 10 pounds.  Poor appetite.  This is related to deconditioning given her relatively clear CT scan as well as good exam today.  Highly encouraged intensifying rehabilitation, physical therapy services.  Referral to outpatient physical therapy sent.  Consider PFTs in the future if strength improves and she still is having dyspnea.  CBC ordered to be obtained today to rule out anemia.   Return if symptoms worsen or fail to improve.   Lanier Clam, MD 12/05/2020

## 2020-12-05 NOTE — Patient Instructions (Addendum)
Nice to meet you  Your lungs sound very clear today which is reassuring to me  I think more physical therapy will help strengthen and improve your symptoms.  I sent a referral for outpatient physical therapy at [facility].  Please call me if I can help in any way.  I will not schedule a follow-up appointment at this time but I am happy to see you at any time.

## 2020-12-17 ENCOUNTER — Other Ambulatory Visit: Payer: Self-pay

## 2020-12-17 ENCOUNTER — Ambulatory Visit: Payer: Medicare Other | Attending: Pulmonary Disease

## 2020-12-17 DIAGNOSIS — R262 Difficulty in walking, not elsewhere classified: Secondary | ICD-10-CM

## 2020-12-17 DIAGNOSIS — M6281 Muscle weakness (generalized): Secondary | ICD-10-CM | POA: Diagnosis present

## 2020-12-17 NOTE — Therapy (Signed)
[place], [place], [address] Phone: [phone]   Fax:  [phone]  Physical Therapy Evaluation  Patient Details  Name: Kayla Medina MRN: [id number] Date of Birth: 24-Feb-1933 Referring Provider (PT): Hunsucker, Bonna Gains, MD   Encounter Date: 12/17/2020   PT End of Session - 12/17/20 1035     Visit Number 1    Number of Visits 17    Date for PT Re-Evaluation 02/14/21    Authorization Type UHC MCR    PT Start Time 1017    PT Stop Time 1100    PT Time Calculation (min) 43 min    Equipment Utilized During Treatment Gait belt;Other (comment)   quad cane   Activity Tolerance Patient limited by fatigue    Behavior During Therapy [facility] for tasks assessed/performed             Past Medical History:  Diagnosis Date   Anal fissure    Ataxia 06/01/2013   Chest pain, unspecified    Chronic systolic CHF (congestive heart failure) ([place]) 09/10/2014   Diabetes mellitus without complication (HCC)    Glaucoma    pt. states no-glaucoma 07/20/13   Hypertension    Obstructive thrombus 04/23/2014   Orthostasis 06/01/2013   Other and unspecified hyperlipidemia    Palpitations    Pneumonia    PVC (premature ventricular contraction)    Rosacea    Shingles    hx   Shortness of breath    Subclinical hypothyroidism    Takotsubo syndrome 05/10/2014    Past Surgical History:  Procedure Laterality Date   APPENDECTOMY     bilateral eye surgery/cataract repair  3/09   BLADDER REPAIR     BOWEL RESECTION     LEFT HEART CATHETERIZATION WITH CORONARY ANGIOGRAM N/A 04/24/2014   Procedure: LEFT HEART CATHETERIZATION WITH CORONARY ANGIOGRAM;  Surgeon: Wellington Hampshire, MD;  Location: [facility];  Service: Cardiovascular;  Laterality: N/A;   TOTAL ABDOMINAL HYSTERECTOMY      There were no vitals filed for this visit.    Subjective Assessment - 12/17/20 1020     Subjective Patient reports she was recently in the hospital  from 8/31-9/4 for pneumonia. She reports she has lost about 15 lbs and she can't get around as well. She can't bathe herself. She is able to fix her meals and complete toileting independently. She was receiving home health PT that she finished last week, but she didn't feel like it was very helpful. She continues to report shortness of breath and fatigue with short distance walking. 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 She is currently using a quad cane, but was not previously using an AD. She is using a rollator around her house. She is not driving, but previously was. She reports she has sciatica, so has occasional low back and leg pain, but she can control that.    Patient is accompained by: Family member   daughter in-law   Limitations Standing;Walking;House hold activities;Lifting    How long can you sit comfortably? no issues    How long can you stand comfortably? a few minutes    How long can you walk comfortably? household distances    Patient Stated Goals I want to go back to work, they are waiting on me; be independent, get my strength back up    Currently in Pain? No/denies  [facility] PT Assessment - 12/17/20 0001       Assessment   Medical Diagnosis R53.1 (ICD-10-CM) - Weakness    Referring Provider (PT) Hunsucker, Bonna Gains, MD    Onset Date/Surgical Date 11/19/20    Hand Dominance Right    Next MD Visit nothing scheduled    Prior Therapy yes- home health PT      Precautions   Precautions Fall      Restrictions   Weight Bearing Restrictions No      Balance Screen   Has the patient fallen in the past 6 months No      [place] residence    Living Arrangements Children    Type of [place]    Additional Comments stairs to enter; handrail on 1 side      Prior Function   Level of Independence Other (comment)   needs assistance with bathing   Vocation  Requirements previously worked 3 hours a day as a Quarry manager (dispenses medication, companion with patients); not working currently    Leisure read      Cognition   Overall Cognitive Status Within Functional Limits for tasks assessed      Observation/Other Assessments   Focus on Therapeutic Outcomes (FOTO)  40% function to 56% predicted      Sensation   Light Touch Not tested      Posture/Postural Control   Posture/Postural Control Postural limitations    Postural Limitations Rounded Shoulders;Forward head      Strength   Right Hip Flexion 4-/5    Left Hip Flexion 4-/5    Right Knee Flexion 5/5    Right Knee Extension 5/5    Left Knee Flexion 5/5    Left Knee Extension 5/5      Transfers   Five time sit to stand comments  18.3 seconds   dyspnea 0.5/10     Standardized Balance Assessment   Standardized Balance Assessment Timed Up and Go Test;Berg Balance Test      Berg Balance Test   Sit to Stand Able to stand without using hands and stabilize independently    Standing Unsupported Able to stand 2 minutes with supervision    Sitting with Back Unsupported but Feet Supported on Floor or Stool Able to sit safely and securely 2 minutes    Stand to Sit Sits safely with minimal use of hands    Transfers Able to transfer safely, definite need of hands    Standing Unsupported with Eyes Closed Able to stand 10 seconds safely    Standing Unsupported with Feet Together Able to place feet together independently and stand 1 minute safely    From Standing, Reach Forward with Outstretched Arm Can reach forward >12 cm safely (5")    From Standing Position, Pick up Object from Floor Able to pick up shoe safely and easily    From Standing Position, Turn to Look Behind Over each Shoulder Looks behind from both sides and weight shifts well    Turn 360 Degrees Able to turn 360 degrees safely but slowly    Standing Unsupported, Alternately Place Feet on Step/Stool Needs assistance to keep from falling or  unable to try    Standing Unsupported, One Foot in [employer] balance while stepping or standing    Standing on One Leg Unable to try or needs assist to prevent fall    Total Score 39      Timed Up and Go Test  Normal TUG (seconds) 17.8   dyspnea 9/10                       Objective measurements completed on examination: See above findings.                PT Education - 12/17/20 1236     Education Details Education on current condition, assessment findings, POC.    Person(s) Educated Patient;Other (comment)    Methods Explanation    Comprehension Verbalized understanding              PT Short Term Goals - 12/17/20 1217       PT SHORT TERM GOAL #1   Title Patient will be independent with initial HEP.    Baseline no time at eval to issue    Status New    Target Date 12/31/20      PT SHORT TERM GOAL #2   Title Therapist will review FOTO and anticipated progress.    Baseline captured at eval    Status New    Target Date 12/31/20      PT SHORT TERM GOAL #3   Title Patient will tolerate at least 5 minutes of standing activity rating her dyspnea as less than 5/10 to improve her tolerance to household activity.    Baseline reported dyspnea as 9/10 after completing TUG    Status New    Target Date 01/14/21      PT SHORT TERM GOAL #4   Title Patient will complete TUG in less than 15 seconds rating dyspnea as less than 2/10.    Baseline see flowsheet    Status New    Target Date 01/14/21               PT Long Term Goals - 12/17/20 1220       PT LONG TERM GOAL #1   Title Patient will complete 5 x STS in less than 13 seconds indicative of improved functional strength    Baseline see flowsheet    Status New    Target Date 02/11/21      PT LONG TERM GOAL #2   Title Patient will demonstrate 5/5 hip flexor strength to improve ability to ascend/descend stairs.    Baseline 4-/5    Status New    Target Date 02/11/21      PT LONG TERM  GOAL #3   Title Patient will score >/=45/56 on BERG to signify reduction in fall risk    Baseline 39    Status New    Target Date 02/11/21      PT LONG TERM GOAL #4   Title Patient will ambulate household distances without need for assistive device    Baseline utilizes quad cane and rollator currently.    Status New    Target Date 02/11/21      PT LONG TERM GOAL #5   Title Patient will be independent with all self-care activities.    Baseline requires assistance with bathing.    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    Baseline unable to work or shop currently    Status New  Target Date 02/11/21                    Plan - 12/17/20 1039     Clinical Impression Statement Patient is an 87 y/o female with recent hospitalization from 11/19/20-11/23/20 due to dyspnea thought to be multi-factorial in nature. She reports significant fatigue, weakness, and shortness of breath since her hospital stay. 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 Since her hosptial stay she is able to walk household distances with use of quad cane or rollator, requires assistance with bathing, and has been unable to return to work due to her weakness/SOB. Upon assessment she demonstrates decreased LE strength and scores at increased fall risk based upon her 5xSTS, TUG, and BERG balance assessment. She quickly fatigues during standing assessment requiring frequent rest break during BERG balance assessment. She also becomes short of breath very quickly rating her dyspnea as 9/10 on the modified scale for dyspena following TUG assessment. She will benefit from  skilled PT to address the above stated deficits in order to return to [facility] and her work as a Quarry manager.    Personal Factors and Comorbidities Age;Comorbidity 3+;Time since onset of injury/illness/exacerbation;Fitness    Comorbidities see PMH above    Examination-Activity Limitations Bathing;Caring for Others;Lift;Locomotion Level;Squat;Stairs;Stand    Examination-Participation Restrictions Cleaning;Community Activity;Driving;Laundry;Meal Prep;Occupation;Shop    Stability/Clinical Decision Making Evolving/Moderate complexity    Clinical Decision Making Moderate    Rehab Potential Good    PT Frequency 2x / week    PT Duration 8 weeks    PT Treatment/Interventions ADLs/Self Care Home Management;Cryotherapy;Moist Heat;Gait training;Stair training;Functional mobility training;Therapeutic activities;Therapeutic exercise;Balance training;Neuromuscular re-education;Patient/family education;Manual techniques;Passive range of motion    PT Next Visit Plan issue HEP, review FOTO, NuStep, generalized strengthening, utilize dyspnea scale.    Consulted and Agree with Plan of Care Patient;Family member/caregiver    Family Member Consulted daughter in-law             Patient will benefit from skilled therapeutic intervention in order to improve the following deficits and impairments:  Difficulty walking, Decreased endurance, Cardiopulmonary status limiting activity, Decreased activity tolerance, Decreased balance, Decreased strength  Visit Diagnosis: Muscle weakness (generalized)  Difficulty in walking, not elsewhere classified     Problem List Patient Active Problem List   Diagnosis Date Noted   Dyspnea on exertion 11/19/2020   Hypothyroidism 11/19/2020   Gastritis 11/19/2020   CKD (chronic kidney disease) stage 3, GFR 30-59 ml/min (HCC) 11/19/2020   Hyponatremia 11/19/2020   FTT (failure to thrive) in adult 38/32/9191   Chronic systolic CHF (congestive heart failure) ([place]) 09/10/2014   HTN  (hypertension) 06/12/2014   Takotsubo syndrome 05/10/2014   Chest pain 04/23/2014   Dyspnea 04/23/2014   Obstructive thrombus 04/23/2014   Orthostasis 06/01/2013   Ataxia 06/01/2013   Type 2 diabetes mellitus without complication ([place]) 66/08/43   Hyperlipidemia 04/17/2009   CHEST PAIN-UNSPECIFIED 04/17/2009   PALPITATIONS 04/14/2009   DYSPNEA 04/14/2009   Gwendolyn Grant, PT, DPT, ATC 12/17/20 12:42 PM   [facility] [address] [place], [place], [address] Phone: [phone]   Fax:  [phone]  Name: Kayla Medina MRN: [id number] Date of Birth: 04-08-1932

## 2020-12-23 ENCOUNTER — Ambulatory Visit: Payer: Medicare Other | Attending: Pulmonary Disease

## 2020-12-23 ENCOUNTER — Other Ambulatory Visit: Payer: Self-pay

## 2020-12-23 VITALS — HR 85

## 2020-12-23 DIAGNOSIS — R262 Difficulty in walking, not elsewhere classified: Secondary | ICD-10-CM | POA: Diagnosis present

## 2020-12-23 DIAGNOSIS — M6281 Muscle weakness (generalized): Secondary | ICD-10-CM | POA: Insufficient documentation

## 2020-12-23 DIAGNOSIS — M5417 Radiculopathy, lumbosacral region: Secondary | ICD-10-CM | POA: Insufficient documentation

## 2020-12-23 NOTE — Therapy (Signed)
[place], [place], [address] Phone: [phone]   Fax:  [phone]  Physical Therapy Treatment  Patient Details  Name: Kayla Medina MRN: [id number] Date of Birth: [birth year] Referring Provider (PT): Hunsucker, Bonna Gains, MD   Encounter Date: 12/23/2020   PT End of Session - 12/23/20 1000     Visit Number 2    Number of Visits 17    Date for PT Re-Evaluation 02/14/21    Authorization Type UHC MCR    PT Start Time 1007    PT Stop Time 1050    PT Time Calculation (min) 43 min    Equipment Utilized During Treatment Other (comment)   quad cane   Activity Tolerance Patient limited by fatigue    Behavior During Therapy [facility] for tasks assessed/performed             Past Medical History:  Diagnosis Date   Anal fissure    Ataxia 06/01/2013   Chest pain, unspecified    Chronic systolic CHF (congestive heart failure) ([place]) 09/10/2014   Diabetes mellitus without complication ([place])    Glaucoma    pt. states no-glaucoma 07/20/13   Hypertension    Obstructive thrombus 04/23/2014   Orthostasis 06/01/2013   Other and unspecified hyperlipidemia    Palpitations    Pneumonia    PVC (premature ventricular contraction)    Rosacea    Shingles    hx   Shortness of breath    Subclinical hypothyroidism    Takotsubo syndrome 05/10/2014    Past Surgical History:  Procedure Laterality Date   APPENDECTOMY     bilateral eye surgery/cataract repair  3/09   BLADDER REPAIR     BOWEL RESECTION     LEFT HEART CATHETERIZATION WITH CORONARY ANGIOGRAM N/A 04/24/2014   Procedure: LEFT HEART CATHETERIZATION WITH CORONARY ANGIOGRAM;  Surgeon: Wellington Hampshire, MD;  Location: [facility];  Service: Cardiovascular;  Laterality: N/A;   TOTAL ABDOMINAL HYSTERECTOMY      Vitals:   12/23/20 1014  Pulse: 85  SpO2: 98%     Subjective Assessment - 12/23/20 1007     Subjective Patient reports she has been walking around her  house for exercise, but still does not feel improved. No pain    Currently in Pain? No/denies                     [facility] Adult PT Treatment/Exercise:  Therapeutic Exercise: - NuStep level 2;  3 minutes, then rest break dyspnea rated as 5/10, then 2.5 additional minutes dyspnea rated as 2/10 - Resisted hip abduction seated green band 2 x 10  - Resisted seated march green band 2 x 10  - LAQ 2 x 10  - Sit to stand 2 x 5   Manual Therapy: - n/a  Neuromuscular re-ed: - n/a  Therapeutic Activity: - n/a  Self-care/Home Management: - Reviewed FOTO score and anticipated progress - Issued HEP - discussed nutritional considerations                     PT Education - 12/23/20 1052     Education Details See self care    Person(s) Educated Patient    Methods Explanation;Demonstration;Verbal cues;Handout    Comprehension Verbalized understanding;Returned demonstration;Verbal cues required              PT Short Term Goals - 12/23/20 1034       PT SHORT TERM GOAL #1  Title Patient will be independent with initial HEP.    Baseline no time at eval to issue    Status On-going    Target Date 12/31/20      PT SHORT TERM GOAL #2   Title Therapist will review FOTO and anticipated progress.    Baseline captured at eval    Status Achieved    Target Date 12/31/20      PT SHORT TERM GOAL #3   Title Patient will tolerate at least 5 minutes of standing activity rating her dyspnea as less than 5/10 to improve her tolerance to household activity.    Baseline reported dyspnea as 9/10 after completing TUG    Status On-going    Target Date 01/14/21      PT SHORT TERM GOAL #4   Title Patient will complete TUG in less than 15 seconds rating dyspnea as less than 2/10.    Baseline see flowsheet    Status Deferred    Target Date 01/14/21               PT Long Term Goals - 12/17/20 1220       PT LONG TERM GOAL #1   Title Patient will complete 5 x STS  in less than 13 seconds indicative of improved functional strength    Baseline see flowsheet    Status New    Target Date 02/11/21      PT LONG TERM GOAL #2   Title Patient will demonstrate 5/5 hip flexor strength to improve ability to ascend/descend stairs.    Baseline 4-/5    Status New    Target Date 02/11/21      PT LONG TERM GOAL #3   Title Patient will score >/=45/56 on BERG to signify reduction in fall risk    Baseline 39    Status New    Target Date 02/11/21      PT LONG TERM GOAL #4   Title Patient will ambulate household distances without need for assistive device    Baseline utilizes quad cane and rollator currently.    Status New    Target Date 02/11/21      PT LONG TERM GOAL #5   Title Patient will be independent with all self-care activities.    Baseline requires assistance with bathing.    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    Baseline unable to work or shop currently    Status New    Target Date 02/11/21                   Plan - 12/23/20 1015     Clinical Impression Statement From waiting area to therapy gym patient required one seated rest break due to shortness of breath and fatigue. When she reached the Nustep she rated her SOB as 9/10 on modified dyspnea scale. With short seated rest break dyspnea decreased to 0.5/10. She was able to complete 3 minutes on Nustep before requesting to rest rating dyspnea as 5/10. She rested and was able to resume for additional 2.5 minutes rating dyspnea as 2/10. Began seated BLE strengthening today, which she tolerated well requiring minimal rest breaks during  seated activity.    PT Treatment/Interventions ADLs/Self Care Home Management;Cryotherapy;Moist Heat;Gait  training;Stair training;Functional mobility training;Therapeutic activities;Therapeutic exercise;Balance training;Neuromuscular re-education;Patient/family education;Manual techniques;Passive range of motion    PT Next Visit Plan Nustep, generalized strengthening, utilize dyspnea scale    PT Home Exercise Plan Access Code: [email or web address]    Consulted and Agree with Plan of Care Patient             Patient will benefit from skilled therapeutic intervention in order to improve the following deficits and impairments:  Difficulty walking, Decreased endurance, Cardiopulmonary status limiting activity, Decreased activity tolerance, Decreased balance, Decreased strength  Visit Diagnosis: Muscle weakness (generalized)  Difficulty in walking, not elsewhere classified     Problem List Patient Active Problem List   Diagnosis Date Noted   Dyspnea on exertion 11/19/2020   Hypothyroidism 11/19/2020   Gastritis 11/19/2020   CKD (chronic kidney disease) stage 3, GFR 30-59 ml/min (HCC) 11/19/2020   Hyponatremia 11/19/2020   FTT (failure to thrive) in adult 15/17/6160   Chronic systolic CHF (congestive heart failure) ([place]) 09/10/2014   HTN (hypertension) 06/12/2014   Takotsubo syndrome 05/10/2014   Chest pain 04/23/2014   Dyspnea 04/23/2014   Obstructive thrombus 04/23/2014   Orthostasis 06/01/2013   Ataxia 06/01/2013   Type 2 diabetes mellitus without complication ([place]) 73/71/0626   Hyperlipidemia 04/17/2009   CHEST PAIN-UNSPECIFIED 04/17/2009   PALPITATIONS 04/14/2009   DYSPNEA 04/14/2009   Gwendolyn Grant, PT, DPT, ATC 12/23/20 10:54 AM  [facility] [address] [place], [place], [address] Phone: [phone]   Fax:  [phone]  Name: Kayla Medina MRN: [id number] Date of Birth: 1932/09/11

## 2020-12-26 ENCOUNTER — Other Ambulatory Visit: Payer: Self-pay

## 2020-12-26 ENCOUNTER — Ambulatory Visit: Payer: Medicare Other | Admitting: Physical Therapy

## 2020-12-26 ENCOUNTER — Encounter: Payer: Self-pay | Admitting: Physical Therapy

## 2020-12-26 DIAGNOSIS — M6281 Muscle weakness (generalized): Secondary | ICD-10-CM

## 2020-12-26 DIAGNOSIS — R262 Difficulty in walking, not elsewhere classified: Secondary | ICD-10-CM

## 2020-12-26 NOTE — Therapy (Signed)
[place], [place], [address] Phone: [phone]   Fax:  [phone]  Physical Therapy Treatment  Patient Details  Name: Kayla Medina MRN: [id number] Date of Birth: [birth year] Referring Provider (PT): Hunsucker, Bonna Gains, MD   Encounter Date: 12/26/2020   PT End of Session - 12/26/20 1158     Visit Number 3    Number of Visits 17    Date for PT Re-Evaluation 02/14/21    Authorization Type UHC MCR    PT Start Time 1149    PT Stop Time 1230    PT Time Calculation (min) 41 min    Activity Tolerance Patient limited by fatigue;Patient limited by pain    Behavior During Therapy [facility] for tasks assessed/performed             Past Medical History:  Diagnosis Date   Anal fissure    Ataxia 06/01/2013   Chest pain, unspecified    Chronic systolic CHF (congestive heart failure) ([place]) 09/10/2014   Diabetes mellitus without complication ([place])    Glaucoma    pt. states no-glaucoma 07/20/13   Hypertension    Obstructive thrombus 04/23/2014   Orthostasis 06/01/2013   Other and unspecified hyperlipidemia    Palpitations    Pneumonia    PVC (premature ventricular contraction)    Rosacea    Shingles    hx   Shortness of breath    Subclinical hypothyroidism    Takotsubo syndrome 05/10/2014    Past Surgical History:  Procedure Laterality Date   APPENDECTOMY     bilateral eye surgery/cataract repair  3/09   BLADDER REPAIR     BOWEL RESECTION     LEFT HEART CATHETERIZATION WITH CORONARY ANGIOGRAM N/A 04/24/2014   Procedure: LEFT HEART CATHETERIZATION WITH CORONARY ANGIOGRAM;  Surgeon: Wellington Hampshire, MD;  Location: [place] [facility]ATH [facility];  Service: Cardiovascular;  Laterality: N/A;   TOTAL ABDOMINAL HYSTERECTOMY      There were no vitals filed for this visit.   Subjective Assessment - 12/26/20 1156     Subjective Very tired.  Sciatica is bad 5/10.  I did the exercises 2 times a day!    Currently in Pain? Yes     Pain Score 5     Pain Location Leg   back pain   Pain Orientation Left    Pain Descriptors / Indicators Aching    Pain Type Chronic pain    Pain Radiating Towards back to LLE    Pain Onset More than a month ago    Pain Frequency Constant    Aggravating Factors  walking standing    Pain Relieving Factors rest    Multiple Pain Sites No               Skilled therapy interventions:   Therapeutic Exercise: Nustep L 1 for 6 min .  Dyspnea scale 1/10  Seated red band    March x 20    Clam x 20    LAQ 2 x 10   Sit to stand with chair in front , red band on thighs    UE assisting due to back pain    2 x 10   Lower trunk rotation x 10 for pain, light   Same exercises as above done in supine with red band  Clam x 20 March 2 x 10   Bridge x 10 band on thighs   Walking 180 feet (1 lap around gym) HR 95, Sa O2  95%, dyspnea 2/10  Self care/Pt. Education:   HEP, back pain , exercise tolerance       PT Short Term Goals - 12/23/20 1034       PT SHORT TERM GOAL #1   Title Patient will be independent with initial HEP.    Baseline no time at eval to issue    Status On-going    Target Date 12/31/20      PT SHORT TERM GOAL #2   Title Therapist will review FOTO and anticipated progress.    Baseline captured at eval    Status Achieved    Target Date 12/31/20      PT SHORT TERM GOAL #3   Title Patient will tolerate at least 5 minutes of standing activity rating her dyspnea as less than 5/10 to improve her tolerance to household activity.    Baseline reported dyspnea as 9/10 after completing TUG    Status On-going    Target Date 01/14/21      PT SHORT TERM GOAL #4   Title Patient will complete TUG in less than 15 seconds rating dyspnea as less than 2/10.    Baseline see flowsheet    Status Deferred    Target Date 01/14/21               PT Long Term Goals - 12/17/20 1220       PT LONG TERM GOAL #1   Title Patient will complete 5 x STS in less than 13 seconds  indicative of improved functional strength    Baseline see flowsheet    Status New    Target Date 02/11/21      PT LONG TERM GOAL #2   Title Patient will demonstrate 5/5 hip flexor strength to improve ability to ascend/descend stairs.    Baseline 4-/5    Status New    Target Date 02/11/21      PT LONG TERM GOAL #3   Title Patient will score >/=45/56 on BERG to signify reduction in fall risk    Baseline 39    Status New    Target Date 02/11/21      PT LONG TERM GOAL #4   Title Patient will ambulate household distances without need for assistive device    Baseline utilizes quad cane and rollator currently.    Status New    Target Date 02/11/21      PT LONG TERM GOAL #5   Title Patient will be independent with all self-care activities.    Baseline requires assistance with bathing.    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    Baseline unable to work or shop currently    Status New    Target Date 02/11/21                   Plan - 12/26/20 1201     Clinical Impression Statement Patient fatigued , needed time to rest between seated and supine exericises.  Dyspnea was minimal overall today.  She showed motivation and desire to improve to get back to work.  She was limited by pain in her back "sciatica" today.  Given simple exercises for this  today.    PT Treatment/Interventions ADLs/Self Care Home Management;Cryotherapy;Moist Heat;Gait training;Stair training;Functional mobility training;Therapeutic activities;Therapeutic exercise;Balance training;Neuromuscular re-education;Patient/family education;Manual techniques;Passive range of motion    PT Next Visit Plan Nustep, generalized strengthening, utilize dyspnea scale    PT Home Exercise  Plan Access Code: [email or web address]    Consulted and Agree with Plan of Care Patient             Patient will benefit from skilled therapeutic intervention in order to improve the following deficits and impairments:  Difficulty walking, Decreased endurance, Cardiopulmonary status limiting activity, Decreased activity tolerance, Decreased balance, Decreased strength  Visit Diagnosis: Muscle weakness (generalized)  Difficulty in walking, not elsewhere classified     Problem List Patient Active Problem List   Diagnosis Date Noted   Dyspnea on exertion 11/19/2020   Hypothyroidism 11/19/2020   Gastritis 11/19/2020   CKD (chronic kidney disease) stage 3, GFR 30-59 ml/min (HCC) 11/19/2020   Hyponatremia 11/19/2020   FTT (failure to thrive) in adult 09/40/7680   Chronic systolic CHF (congestive heart failure) ([place]) 09/10/2014   HTN (hypertension) 06/12/2014   Takotsubo syndrome 05/10/2014   Chest pain 04/23/2014   Dyspnea 04/23/2014   Obstructive thrombus 04/23/2014   Orthostasis 06/01/2013   Ataxia 06/01/2013   Type 2 diabetes mellitus without complication ([place]) 88/01/314   Hyperlipidemia 04/17/2009   CHEST PAIN-UNSPECIFIED 04/17/2009   PALPITATIONS 04/14/2009   DYSPNEA 04/14/2009    Roben Schliep, PT 12/26/2020, 12:41 PM  [facility] [address] [place], [place], [address] Phone: [phone]   Fax:  [phone]  Name: SIMONNE BOULOS MRN: [id number] Date of Birth: December 12, 1932   Raeford Razor, PT 12/26/20 12:41 PM Phone: [phone] Fax: [phone]

## 2020-12-29 ENCOUNTER — Other Ambulatory Visit: Payer: Self-pay

## 2020-12-29 ENCOUNTER — Ambulatory Visit: Payer: Medicare Other

## 2020-12-29 DIAGNOSIS — M6281 Muscle weakness (generalized): Secondary | ICD-10-CM | POA: Diagnosis not present

## 2020-12-29 DIAGNOSIS — R262 Difficulty in walking, not elsewhere classified: Secondary | ICD-10-CM

## 2020-12-29 NOTE — Therapy (Signed)
[place], [place], [address] Phone: [phone]   Fax:  [phone]  Physical Therapy Treatment  Patient Details  Name: Kayla Medina MRN: [id number] Date of Birth: 08-24-1932 Referring Provider (PT): Hunsucker, Bonna Gains, MD   Encounter Date: 12/29/2020   PT End of Session - 12/29/20 1021     Visit Number 4    Number of Visits 17    Date for PT Re-Evaluation 02/14/21    Authorization Type UHC MCR    Progress Note Due on Visit 10    PT Start Time 1015    PT Stop Time 1057    PT Time Calculation (min) 42 min    Activity Tolerance Patient limited by fatigue;Patient limited by pain    Behavior During Therapy [facility] for tasks assessed/performed             Past Medical History:  Diagnosis Date   Anal fissure    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 states no-glaucoma 07/20/13   Hypertension    Obstructive thrombus 04/23/2014   Orthostasis 06/01/2013   Other and unspecified hyperlipidemia    Palpitations    Pneumonia    PVC (premature ventricular contraction)    Rosacea    Shingles    hx   Shortness of breath    Subclinical hypothyroidism    Takotsubo syndrome 05/10/2014    Past Surgical History:  Procedure Laterality Date   APPENDECTOMY     bilateral eye surgery/cataract repair  3/09   BLADDER REPAIR     BOWEL RESECTION     LEFT HEART CATHETERIZATION WITH CORONARY ANGIOGRAM N/A 04/24/2014   Procedure: LEFT HEART CATHETERIZATION WITH CORONARY ANGIOGRAM;  Surgeon: Wellington Hampshire, MD;  Location: [place] CATH [facility];  Service: Cardiovascular;  Laterality: N/A;   TOTAL ABDOMINAL HYSTERECTOMY      There were no vitals filed for this visit.   Subjective Assessment - 12/29/20 1022     Subjective Patient reports the sciatica has worsened over the weekend, but  daughter in-law reports she was walking more this weekend around the house without an AD.    Currently in Pain? Yes    Pain Score 4     Pain Location Leg    Pain Orientation Left    Pain Descriptors / Indicators Tiring    Pain Type Chronic pain    Pain Radiating Towards back to LLE    Pain Onset More than a month ago    Pain Frequency Constant    Aggravating Factors  walking, standing    Pain Relieving Factors rest               Skilled therapy interventions:     Therapeutic Exercise: - Nustep L 3 for 5 min .  Dyspnea scale 0.5/10  - Repeated trunk flexion seated 2 x 10  - SKC 1 x 10  - Walking 185 feet (1 lap around gym without use of quad cane) dyspnea 2/10 - Standing calf raise 2 x 15  - LAQ 2 x10 @ 1lb - HS curl 2 x 10 red band - Standing march 2 x 10  - SLR 2 x 10 RLE (unable LLE due to pain)    Not performed today:  Seated red band  March x 20                          Clam x 20                          LAQ 2 x 10  Sit to stand with chair in front , red band on thighs                          UE assisting due to back pain                          2 x 10    Lower trunk rotation x 10 for pain, light    Same exercises as above done in supine with red band  Clam x 20 March 2 x 10              Bridge x 10 band on thighs   Self care/Pt. Education:  - updated HEP                              PT Short Term Goals - 12/23/20 1034       PT SHORT TERM GOAL #1   Title Patient will be independent with initial HEP.    Baseline no time at eval to issue    Status On-going    Target Date 12/31/20      PT SHORT TERM GOAL #2   Title Therapist will review FOTO and anticipated progress.    Baseline captured at eval    Status Achieved    Target Date 12/31/20      PT SHORT TERM GOAL #3   Title Patient will tolerate at least 5 minutes of standing activity rating her dyspnea as less than 5/10 to improve her tolerance  to household activity.    Baseline reported dyspnea as 9/10 after completing TUG    Status On-going    Target Date 01/14/21      PT SHORT TERM GOAL #4   Title Patient will complete TUG in less than 15 seconds rating dyspnea as less than 2/10.    Baseline see flowsheet    Status Deferred    Target Date 01/14/21               PT Long Term Goals - 12/17/20 1220       PT LONG TERM GOAL #1   Title Patient will complete 5 x STS in less than 13 seconds indicative of improved functional strength    Baseline see flowsheet    Status New    Target Date 02/11/21      PT LONG TERM GOAL #2   Title Patient will demonstrate 5/5 hip flexor strength to improve ability to ascend/descend stairs.    Baseline 4-/5    Status New    Target Date 02/11/21      PT LONG TERM GOAL #3   Title Patient will score >/=45/56 on BERG to signify reduction in fall risk    Baseline 39    Status New    Target Date 02/11/21      PT LONG TERM GOAL #4   Title Patient will ambulate household distances without need for assistive device    Baseline utilizes quad cane and rollator currently.  Status New    Target Date 02/11/21      PT LONG TERM GOAL #5   Title Patient will be independent with all self-care activities.    Baseline requires assistance with bathing.    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    Baseline unable to work or shop currently    Status New    Target Date 02/11/21                   Plan - 12/29/20 1037     Clinical Impression Statement Given patient's continued complaint of "sciatica" we did trial repeated movement at beginning of session with patient appearing to have a flexion preference of movement as this does reduce her LLE. She  was able to walk 1 lap around therapy gym without use of quad cane rating her dypsnea as 2/10 at conclusion. Able to introduce standing activity, though this was limited by her back/LLE pain.    PT Treatment/Interventions ADLs/Self Care Home Management;Cryotherapy;Moist Heat;Gait training;Stair training;Functional mobility training;Therapeutic activities;Therapeutic exercise;Balance training;Neuromuscular re-education;Patient/family education;Manual techniques;Passive range of motion    PT Next Visit Plan Nustep, generalized strengthening, utilize dyspnea scale    PT Home Exercise Plan Access Code: [email or web address]    Consulted and Agree with Plan of Care Patient             Patient will benefit from skilled therapeutic intervention in order to improve the following deficits and impairments:  Difficulty walking, Decreased endurance, Cardiopulmonary status limiting activity, Decreased activity tolerance, Decreased balance, Decreased strength  Visit Diagnosis: Muscle weakness (generalized)  Difficulty in walking, not elsewhere classified     Problem List Patient Active Problem List   Diagnosis Date Noted   Dyspnea on exertion 11/19/2020   Hypothyroidism 11/19/2020   Gastritis 11/19/2020   CKD (chronic kidney disease) stage 3, GFR 30-59 ml/min (HCC) 11/19/2020   Hyponatremia 11/19/2020   FTT (failure to thrive) in adult 47/42/5956   Chronic systolic CHF (congestive heart failure) ([place]) 09/10/2014   HTN (hypertension) 06/12/2014   Takotsubo syndrome 05/10/2014   Chest pain 04/23/2014   Dyspnea 04/23/2014   Obstructive thrombus 04/23/2014   Orthostasis 06/01/2013   Ataxia 06/01/2013   Type 2 diabetes mellitus without complication ([place]) 38/75/6433   Hyperlipidemia 04/17/2009   CHEST PAIN-UNSPECIFIED 04/17/2009   PALPITATIONS 04/14/2009   DYSPNEA 04/14/2009   Gwendolyn Grant, PT, DPT, ATC 12/29/20 11:00 AM   [facility] [address] [place], [place], [address] Phone: [phone]   Fax:  [phone]  Name: Kayla Medina MRN: [id number] Date of Birth: [birth year]

## 2020-12-31 ENCOUNTER — Telehealth: Payer: Self-pay | Admitting: Physical Therapy

## 2020-12-31 ENCOUNTER — Ambulatory Visit: Payer: Medicare Other | Admitting: Physical Therapy

## 2020-12-31 NOTE — Telephone Encounter (Signed)
Called patient regarding her missed appt this AM.  She stated she is unable to walk due to Rt LE weakness, back and Rt leg pain .  This is chronic but worsened yesterday.  She denies red flags.  She can stand but not without her cane.  She is unable to safely get to PT or drive.  She called the MD for medication.  She would like to get a referral for PT.  Unfortunately the call was disconnected and I was unable to get through when I called her on another line.  Will try reaching out again at a later time.   Raeford Razor, PT 12/31/20 11:06 AM Phone: [phone] Fax: [phone]

## 2021-01-05 ENCOUNTER — Ambulatory Visit: Payer: Medicare Other | Admitting: Physical Therapy

## 2021-01-07 ENCOUNTER — Ambulatory Visit: Payer: Medicare Other | Admitting: Physical Therapy

## 2021-01-07 ENCOUNTER — Other Ambulatory Visit: Payer: Self-pay

## 2021-01-07 DIAGNOSIS — R262 Difficulty in walking, not elsewhere classified: Secondary | ICD-10-CM

## 2021-01-07 DIAGNOSIS — M5417 Radiculopathy, lumbosacral region: Secondary | ICD-10-CM

## 2021-01-07 DIAGNOSIS — M6281 Muscle weakness (generalized): Secondary | ICD-10-CM

## 2021-01-07 NOTE — Therapy (Addendum)
[place], [place], [address] Phone: [phone]   Fax:  [phone]  Physical Therapy Treatment/Re-Evaluation/Discharge  PHYSICAL THERAPY DISCHARGE SUMMARY  Visits from Start of Care: 5  Current functional level related to goals / functional outcomes: See goals below   Remaining deficits: Status unknown.   Education / Equipment: N/A   Patient agrees to discharge. Patient goals were partially met. Patient is being discharged due to not returning since the last visit.   Patient Details  Name: Kayla Medina MRN: [id number] Date of Birth: 10/05/1932 Referring Provider (PT): Dr. Marton Redwood   Encounter Date: 01/07/2021   PT End of Session - 01/07/21 1343     Visit Number 5    Number of Visits 17    Date for PT Re-Evaluation 02/14/21    Authorization Type U[employer] MCR    PT Start Time 1105    PT Stop Time 1150    PT Time Calculation (min) 45 min    Equipment Utilized During Treatment Other (comment)   Wheelchair   Activity Tolerance Patient limited by pain             Past Medical History:  Diagnosis Date   Anal fissure    Ataxia 06/01/2013   Chest pain, unspecified    Chronic systolic CHF (congestive heart failure) ([place]) 09/10/2014   Diabetes mellitus without complication ([facility])    Glaucoma    pt. states no-glaucoma 07/20/13   Hypertension    Obstructive thrombus 04/23/2014   Orthostasis 06/01/2013   Other and unspecified hyperlipidemia    Palpitations    Pneumonia    PVC (premature ventricular contraction)    Rosacea    Shingles    hx   Shortness of breath    Subclinical hypothyroidism    Takotsubo syndrome 05/10/2014    Past Surgical History:  Procedure Laterality Date   APPENDECTOMY     bilateral eye surgery/cataract repair  3/09   BLADDER REPAIR     BOWEL RESECTION     LEFT HEART CATHETERIZATION WITH CORONARY ANGIOGRAM N/A 04/24/2014   Procedure: LEFT HEART CATHETERIZATION WITH  CORONARY ANGIOGRAM;  Surgeon: Wellington Hampshire, MD;  Location: [facility];  Service: Cardiovascular;  Laterality: N/A;   TOTAL ABDOMINAL HYSTERECTOMY      There were no vitals filed for this visit.   Subjective Assessment - 01/07/21 1030     Subjective The L sciatica quit.  Then the back of her Rt hip started hurting. There were days when she could not stand  on it, knee buckles and she has severe pain .  Saw Dr. Brigitte Pulse 01/06/21.  She has "lumps" on her buttock.  She reports getting a new chair that she thinks may be the cause.    Currently in Pain? Yes    Pain Score 9     Pain Location Leg    Pain Orientation Right;Posterior;Lateral    Pain Descriptors / Indicators Aching;Nagging   crying pain   Pain Type Acute pain    Pain Radiating Towards lower and foot    Pain Onset 1 to 4 weeks ago    Pain Frequency Intermittent    Aggravating Factors  walking, standing    Pain Relieving Factors sitting relieves it    Multiple Pain Sites No                [facility] PT Assessment - 01/07/21 0001       Assessment   Medical Diagnosis  R hip pain    Referring Provider (PT) Dr. Marton Redwood    Onset Date/Surgical Date --   1-2 weeks     Strength   Right Hip Flexion 4-/5    Left Hip Flexion 4+/5    Right Knee Flexion 4+/5    Right Knee Extension 5/5    Left Knee Flexion 5/5    Left Knee Extension 5/5      Palpation   Palpation comment Rt piriformis pain, severe with small yet irritated areas of tension, no pain in lumbar spine      Special Tests   Other special tests pain with Rt hip extension, flexion and unabel to toelrate ER /IR      Ambulation/Gait   Ambulation Distance (Feet) 50 Feet    Assistive device Small based quad cane    Gait Pattern Decreased stance time - right;Antalgic;Lateral hip instability;Trunk flexed    Gait Comments Pt required cane and PT handheld assist to walk into PT gym.  Used wheelchair for safety as she exited.             Treatment  Sidelying  manual therapy to Rt piriformis and Rt lateral hip, piriformis in spasm pain in sciatic notch, severe and not well tolerated.    Applied IFC to Rt hip positioned in hooklying legs at 90 deg. 15 min to tolerance (25 mV)        PT Education - 01/07/21 1343     Education Details ssee instructions. Use cane in L UE for Rt LE pain    Person(s) Educated Patient;Other (comment);Child(ren)    Methods Explanation    Comprehension Verbalized understanding              PT Short Term Goals - 12/23/20 1034       PT SHORT TERM GOAL #1   Title Patient will be independent with initial HEP.    Baseline no time at eval to issue    Status On-going    Target Date 12/31/20      PT SHORT TERM GOAL #2   Title Therapist will review FOTO and anticipated progress.    Baseline captured at eval    Status Achieved    Target Date 12/31/20      PT SHORT TERM GOAL #3   Title Patient will tolerate at least 5 minutes of standing activity rating her dyspnea as less than 5/10 to improve her tolerance to household activity.    Baseline reported dyspnea as 9/10 after completing TUG    Status On-going    Target Date 01/14/21      PT SHORT TERM GOAL #4   Title Patient will complete TUG in less than 15 seconds rating dyspnea as less than 2/10.    Baseline see flowsheet    Status Deferred    Target Date 01/14/21               PT Long Term Goals - 12/17/20 1220       PT LONG TERM GOAL #1   Title Patient will complete 5 x STS in less than 13 seconds indicative of improved functional strength    Baseline see flowsheet    Status New    Target Date 02/11/21      PT LONG TERM GOAL #2   Title Patient will demonstrate 5/5 hip flexor strength to improve ability to ascend/descend stairs.    Baseline 4-/5    Status New    Target Date 02/11/21  PT LONG TERM GOAL #3   Title Patient will score >/=45/56 on BERG to signify reduction in fall risk    Baseline 39    Status New    Target Date  02/11/21      PT LONG TERM GOAL #4   Title Patient will ambulate household distances without need for assistive device    Baseline utilizes quad cane and rollator currently.    Status New    Target Date 02/11/21      PT LONG TERM GOAL #5   Title Patient will be independent with all self-care activities.    Baseline requires assistance with bathing.    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    Baseline unable to work or shop currently    Status New    Target Date 02/11/21                   Plan - 01/07/21 1344     Clinical Impression Statement Pt. unable to tolerate full PT evaluation for her Rt hip, LE pain .  She will need to go on HOLD until her pain is more manageable.  She took a Codeine but vomited, knowing she is allergic to it.  Tylenol does not reduce her pain . Offered light massage as tolerated and IFC for pain relief which did not help.  Called Dr. Manya Silvas office recommending she undergo further testing and consider injection.    Personal Factors and Comorbidities Age;Comorbidity 3+;Time since onset of injury/illness/exacerbation;Fitness    Comorbidities see PMH above    Examination-Activity Limitations Bathing;Caring for Others;Lift;Locomotion Level;Squat;Stairs;Stand;Bend;Transfers    Examination-Participation Restrictions Cleaning;Community Activity;Driving;Laundry;Meal Prep;Occupation;Shop    Stability/Clinical Decision Making Evolving/Moderate complexity    Clinical Decision Making Moderate    Rehab Potential Fair    PT Frequency 2x / week    PT Duration 8 weeks    PT Treatment/Interventions ADLs/Self Care Home Management;Cryotherapy;Moist Heat;Gait training;Stair training;Functional mobility training;Therapeutic  activities;Therapeutic exercise;Balance training;Neuromuscular re-education;Patient/family education;Manual techniques;Passive range of motion;Electrical Stimulation;DME Instruction    PT Next Visit Plan Hold PT for now    PT Home Exercise Plan Access Code: [email or web address]    Recommended Other Services MRI, injection    Consulted and Agree with Plan of Care Patient    Family Member Consulted daughter in-law             Patient will benefit from skilled therapeutic intervention in order to improve the following deficits and impairments:  Difficulty walking, Decreased endurance, Cardiopulmonary status limiting activity, Decreased activity tolerance, Decreased balance, Decreased strength, Pain, Increased muscle spasms, Decreased mobility, Decreased range of motion, Increased fascial restricitons  Visit Diagnosis: Muscle weakness (generalized)  Difficulty in walking, not elsewhere classified  Lumbosacral radiculopathy     Problem List Patient Active Problem List   Diagnosis Date Noted   Dyspnea on exertion 11/19/2020   Hypothyroidism 11/19/2020   Gastritis 11/19/2020   CKD (chronic kidney disease) stage 3, GFR 30-59 ml/min ([facility]) 11/19/2020   Hyponatremia 11/19/2020   FTT (failure to thrive) in adult 41/28/7867   Chronic systolic CHF (congestive heart failure) ([facility]) 09/10/2014   HTN (hypertension) 06/12/2014   Takotsubo syndrome 05/10/2014   Chest pain  04/23/2014   Dyspnea 04/23/2014   Obstructive thrombus 04/23/2014   Orthostasis 06/01/2013   Ataxia 06/01/2013   Type 2 diabetes mellitus without complication ([place]) 70/62/3762   Hyperlipidemia 04/17/2009   CHEST PAIN-UNSPECIFIED 04/17/2009   PALPITATIONS 04/14/2009   DYSPNEA 04/14/2009    Margart Zemanek, PT 01/07/2021, 4:38 PM  [facility] [address] [place], [place], [address] Phone: [phone]   Fax:  [phone]  Name: KIERSTAN AUER MRN: [id number] Date of Birth:  [birth year]   Raeford Razor, PT 01/07/21 4:40 PM Phone: [phone] Fax: [phone]    Gwendolyn Grant, PT, DPT, ATC 04/20/21 12:15 PM

## 2021-01-07 NOTE — Patient Instructions (Signed)
Pt offered info on positioning, inflammation, anatomy of sciatic nerve, low lumbar as likely source of pain.  Used IFC in supine with hips and knee flexed to 90 with bolster.   Plan of care to hold PT.

## 2021-01-08 ENCOUNTER — Other Ambulatory Visit: Payer: Self-pay | Admitting: Internal Medicine

## 2021-01-08 DIAGNOSIS — M5416 Radiculopathy, lumbar region: Secondary | ICD-10-CM

## 2021-01-12 ENCOUNTER — Ambulatory Visit: Payer: Medicare Other | Admitting: Physical Therapy

## 2021-01-19 ENCOUNTER — Encounter: Payer: Medicare Other | Admitting: Physical Therapy

## 2021-01-20 ENCOUNTER — Other Ambulatory Visit: Payer: Self-pay

## 2021-01-20 ENCOUNTER — Ambulatory Visit
Admission: RE | Admit: 2021-01-20 | Discharge: 2021-01-20 | Disposition: A | Discharge status: Home / Self Care | Payer: Medicare Other | Source: Ambulatory Visit | Attending: Internal Medicine | Admitting: Internal Medicine

## 2021-01-20 DIAGNOSIS — M5416 Radiculopathy, lumbar region: Secondary | ICD-10-CM

## 2021-01-26 ENCOUNTER — Encounter: Payer: Medicare Other | Admitting: Physical Therapy

## 2021-01-30 ENCOUNTER — Other Ambulatory Visit: Payer: Self-pay | Admitting: Neurosurgery

## 2021-02-02 ENCOUNTER — Encounter: Payer: Medicare Other | Admitting: Physical Therapy

## 2021-02-03 NOTE — Progress Notes (Signed)
Surgical Instructions    Your procedure is scheduled on Thursday November 17th.  Report to [facility] Main Entrance "A" at 10:15 A.M., then check in with the Admitting office.  Call this number if you have problems the morning of surgery:  [phone]   If you have any questions prior to your surgery date call [phone]: Open Monday-Friday 8am-4pm    Remember:  Do not eat or drink after midnight the night before your surgery     Take these medicines the morning of surgery with A SIP OF WATER levothyroxine (SYNTHROID) 75 MCG tablet  IF NEEDED  acetaminophen (TYLENOL) 650 MG CR tablet cyclobenzaprine (FLEXERIL) 5 MG tablet meclizine (ANTIVERT) 25 MG tablet nitroGLYCERIN (NITROSTAT) 0.4 MG SL tablet ondansetron (ZOFRAN) 4 MG tablet   As of today, STOP taking any Aspirin (unless otherwise instructed by your surgeon) Aleve, Naproxen, Ibuprofen, Motrin, Advil, Goody's, BC's, all herbal medications, fish oil, and all vitamins.  WHAT DO I DO ABOUT MY DIABETES MEDICATION?   Do not take oral diabetes medicines (Metformin) the morning of surgery.   HOW TO MANAGE YOUR DIABETES BEFORE AND AFTER SURGERY  Why is it important to control my blood sugar before and after surgery? Improving blood sugar levels before and after surgery helps healing and can limit problems. A way of improving blood sugar control is eating a healthy diet by:  Eating less sugar and carbohydrates  Increasing activity/exercise  Talking with your doctor about reaching your blood sugar goals High blood sugars (greater than 180 mg/dL) can raise your risk of infections and slow your recovery, so you will need to focus on controlling your diabetes during the weeks before surgery. Make sure that the doctor who takes care of your diabetes knows about your planned surgery including the date and location.  How do I manage my blood sugar before surgery? Check your blood sugar at least 4 times a day, starting 2 days  before surgery, to make sure that the level is not too high or low.  Check your blood sugar the morning of your surgery when you wake up and every 2 hours until you get to the Short Stay unit.  If your blood sugar is less than 70 mg/dL, you will need to treat for low blood sugar: Do not take insulin. Treat a low blood sugar (less than 70 mg/dL) with  cup of clear juice (cranberry or apple), 4 glucose tablets, OR glucose gel. Recheck blood sugar in 15 minutes after treatment (to make sure it is greater than 70 mg/dL). If your blood sugar is not greater than 70 mg/dL on recheck, call [phone] for further instructions. Report your blood sugar to the short stay nurse when you get to Short Stay.  If you are admitted to the hospital after surgery: Your blood sugar will be checked by the staff and you will probably be given insulin after surgery (instead of oral diabetes medicines) to make sure you have good blood sugar levels. The goal for blood sugar control after surgery is 80-180 mg/dL.    After your COVID test   You are not required to quarantine however you are required to wear a well-fitting mask when you are out and around people not in your household.  If your mask becomes wet or soiled, replace with a new one.  Wash your hands often with soap and water for 20 seconds or clean your hands with an alcohol-based hand sanitizer that contains at least 60% alcohol.  Do not share  personal items.  Notify your provider: if you are in close contact with someone who has COVID  or if you develop a fever of 100.4 or greater, sneezing, cough, sore throat, shortness of breath or body aches.             Do not wear jewelry or makeup Do not wear lotions, powders, perfumes, or deodorant. Do not shave 48 hours prior to surgery.   Do not bring valuables to the hospital. DO Not wear nail polish, gel polish, artificial nails, or any other type of covering on natural nails including finger and  toenails. If patients have artificial nails, gel coating, etc. that need to be removed by a nail salon, please have this removed prior to surgery or surgery may need to be canceled/delayed if the surgeon/ anesthesia feels like the patient is unable to be adequately monitored.             [facility] is not responsible for any belongings or valuables.  Do NOT Smoke (Tobacco/Vaping)  24 hours prior to your procedure  If you use a CPAP at night, you may bring your mask for your overnight stay.   Contacts, glasses, hearing aids, dentures or partials may not be worn into surgery, please bring cases for these belongings   For patients admitted to the hospital, discharge time will be determined by your treatment team.   Patients discharged the day of surgery will not be allowed to drive home, and someone needs to stay with them for 24 hours.  NO VISITORS WILL BE ALLOWED IN PRE-OP WHERE PATIENTS ARE PREPPED FOR SURGERY.  ONLY 1 SUPPORT PERSON MAY BE PRESENT IN THE WAITING ROOM WHILE YOU ARE IN SURGERY.  IF YOU ARE TO BE ADMITTED, ONCE YOU ARE IN YOUR ROOM YOU WILL BE ALLOWED TWO (2) VISITORS. 1 (ONE) VISITOR MAY STAY OVERNIGHT BUT MUST ARRIVE TO THE ROOM BY 8pm.  Minor children may have two parents present. Special consideration for safety and communication needs will be reviewed on a case by case basis.  Special instructions:    Oral Hygiene is also important to reduce your risk of infection.  Remember - BRUSH YOUR TEETH THE MORNING OF SURGERY WITH YOUR REGULAR TOOTHPASTE   [facility]- Preparing For Surgery  Before surgery, you can play an important role. Because skin is not sterile, your skin needs to be as free of germs as possible. You can reduce the number of germs on your skin by washing with CHG (chlorahexidine gluconate) Soap before surgery.  CHG is an antiseptic cleaner which kills germs and bonds with the skin to continue killing germs even after washing.     Please do not use if you  have an allergy to CHG or antibacterial soaps. If your skin becomes reddened/irritated stop using the CHG.  Do not shave (including legs and underarms) for at least 48 hours prior to first CHG shower. It is OK to shave your face.  Please follow these instructions carefully.     Shower the NIGHT BEFORE SURGERY and the MORNING OF SURGERY with CHG Soap.   If you chose to wash your hair, wash your hair first as usual with your normal shampoo. After you shampoo, rinse your hair and body thoroughly to remove the shampoo.  Then [employer] and genitals (private parts) with your normal soap and rinse thoroughly to remove soap.  After that Use CHG Soap as you would any other liquid soap. You can apply CHG directly to  the skin and wash gently with a scrungie or a clean washcloth.   Apply the CHG Soap to your body ONLY FROM THE NECK DOWN.  Do not use on open wounds or open sores. Avoid contact with your eyes, ears, mouth and genitals (private parts). Wash Face and genitals (private parts)  with your normal soap.   Wash thoroughly, paying special attention to the area where your surgery will be performed.  Thoroughly rinse your body with warm water from the neck down.  DO NOT shower/wash with your normal soap after using and rinsing off the CHG Soap.  Pat yourself dry with a CLEAN TOWEL.  Wear CLEAN PAJAMAS to bed the night before surgery  Place CLEAN SHEETS on your bed the night before your surgery  DO NOT SLEEP WITH PETS.   Day of Surgery:  Take a shower with CHG soap. Wear Clean/Comfortable clothing the morning of surgery Do not apply any deodorants/lotions.   Remember to brush your teeth WITH YOUR REGULAR TOOTHPASTE.   Please read over the following fact sheets that you were given.

## 2021-02-04 ENCOUNTER — Ambulatory Visit: Payer: Medicare Other

## 2021-02-04 ENCOUNTER — Encounter (HOSPITAL_COMMUNITY)
Admission: RE | Admit: 2021-02-04 | Discharge: 2021-02-04 | Disposition: A | Discharge status: Home / Self Care | Payer: Medicare Other | Source: Ambulatory Visit | Attending: Neurosurgery | Admitting: Neurosurgery

## 2021-02-04 ENCOUNTER — Other Ambulatory Visit: Payer: Self-pay

## 2021-02-04 ENCOUNTER — Encounter (HOSPITAL_COMMUNITY): Payer: Self-pay

## 2021-02-04 ENCOUNTER — Other Ambulatory Visit: Payer: Self-pay | Admitting: Neurosurgery

## 2021-02-04 VITALS — BP 147/93 | HR 97 | Temp 97.8°F | Resp 18 | Ht 66.0 in | Wt 141.0 lb

## 2021-02-04 DIAGNOSIS — E119 Type 2 diabetes mellitus without complications: Secondary | ICD-10-CM | POA: Diagnosis not present

## 2021-02-04 DIAGNOSIS — Z20822 Contact with and (suspected) exposure to covid-19: Secondary | ICD-10-CM | POA: Insufficient documentation

## 2021-02-04 DIAGNOSIS — Z01818 Encounter for other preprocedural examination: Secondary | ICD-10-CM

## 2021-02-04 DIAGNOSIS — I10 Essential (primary) hypertension: Secondary | ICD-10-CM | POA: Diagnosis not present

## 2021-02-04 DIAGNOSIS — Z01812 Encounter for preprocedural laboratory examination: Secondary | ICD-10-CM | POA: Diagnosis present

## 2021-02-04 HISTORY — DX: Gastro-esophageal reflux disease without esophagitis: K21.9

## 2021-02-04 LAB — BASIC METABOLIC PANEL
Anion gap: 8 (ref 5–15)
BUN: 15 mg/dL (ref 8–23)
CO2: 25 mmol/L (ref 22–32)
Calcium: 9 mg/dL (ref 8.9–10.3)
Chloride: 106 mmol/L (ref 98–111)
Creatinine, Ser: 0.96 mg/dL (ref 0.44–1.00)
GFR, Estimated: 58 mL/min — ABNORMAL LOW (ref >60)
Glucose, Bld: 237 mg/dL — ABNORMAL HIGH (ref 70–99)
Potassium: 3.6 mmol/L (ref 3.5–5.1)
Sodium: 139 mmol/L (ref 135–145)

## 2021-02-04 LAB — SARS CORONAVIRUS 2 (TAT 6-24 HRS): SARS Coronavirus 2: NEGATIVE

## 2021-02-04 LAB — CBC
HCT: 41.1 % (ref 36.0–46.0)
Hemoglobin: 13.1 g/dL (ref 12.0–15.0)
MCH: 30.4 pg (ref 26.0–34.0)
MCHC: 31.9 g/dL (ref 30.0–36.0)
MCV: 95.4 fL (ref 80.0–100.0)
Platelets: 303 10*3/uL (ref 150–400)
RBC: 4.31 MIL/uL (ref 3.87–5.11)
RDW: 14.4 % (ref 11.5–15.5)
WBC: 8.7 10*3/uL (ref 4.0–10.5)
nRBC: 0 % (ref 0.0–0.2)

## 2021-02-04 LAB — HEMOGLOBIN A1C
Hgb A1c MFr Bld: 7.7 % — ABNORMAL HIGH (ref 4.8–5.6)
Mean Plasma Glucose: 174.29 mg/dL

## 2021-02-04 LAB — SURGICAL PCR SCREEN
MRSA, PCR: NEGATIVE
Staphylococcus aureus: NEGATIVE

## 2021-02-04 LAB — GLUCOSE, CAPILLARY: Glucose-Capillary: 261 mg/dL — ABNORMAL HIGH (ref 70–99)

## 2021-02-04 NOTE — Progress Notes (Addendum)
[place] and left message with patient to let us know if she was still coming to her 8am appt.

## 2021-02-04 NOTE — Progress Notes (Signed)
PCP - Dr. Marton Redwood  Cardiologist - Dr,. Nahser  Chest x-ray - 11/21/20 EKG - 11/20/20 Stress Test - Yes "several years ago" no f/u needed ECHO - 11/10/20 Cardiac Cath - Denies  Sleep Study - Denies  DM - Type II Fasting Blood Sugar - CBG 130's this am at home averages 100-140's CBG at PAT appt 261 Checks Blood Sugar daily  COVID TEST- 02/04/21   Anesthesia review: Yes cardiac history  Patient denies shortness of breath, fever, cough and chest pain at PAT appointment   All instructions explained to the patient, with a verbal understanding of the material. Patient agrees to go over the instructions while at home for a better understanding. Patient also instructed to wear a mask while in public after being tested for COVID-19. The opportunity to ask questions was provided.

## 2021-02-04 NOTE — Anesthesia Preprocedure Evaluation (Addendum)
Anesthesia Evaluation  Patient identified by MRN, date of birth, ID band Patient awake    Reviewed: Allergy & Precautions, NPO status , Patient's Chart, lab work & pertinent test results  Airway Mallampati: II  TM Distance: <3 FB Neck ROM: Full    Dental  (+) Teeth Intact, Dental Advisory Given   Pulmonary former smoker,    Pulmonary exam normal        Cardiovascular hypertension, +CHF   Rhythm:Regular Rate:Normal     Neuro/Psych negative neurological ROS  negative psych ROS   GI/Hepatic Neg liver ROS, GERD  ,  Endo/Other  diabetes, Type 2, Oral Hypoglycemic AgentsHypothyroidism   Renal/GU      Musculoskeletal negative musculoskeletal ROS (+)   Abdominal Normal abdominal exam  (+)   Peds  Hematology negative hematology ROS (+)   Anesthesia Other Findings   Reproductive/Obstetrics                           Anesthesia Physical Anesthesia Plan  ASA: 3  Anesthesia Plan: General   Post-op Pain Management:    Induction: Intravenous  PONV Risk Score and Plan: 4 or greater and Ondansetron, Dexamethasone and Treatment may vary due to age or medical condition  Airway Management Planned: Oral ETT  Additional Equipment: None  Intra-op Plan:   Post-operative Plan: Extubation in OR  Informed Consent: I have reviewed the patients History and Physical, chart, labs and discussed the procedure including the risks, benefits and alternatives for the proposed anesthesia with the patient or authorized representative who has indicated his/her understanding and acceptance.     Dental advisory given  Plan Discussed with: CRNA  Anesthesia Plan Comments: (PAT note by Karoline Caldwell, PA-C: Follows with cardiology for history of HTN, left bundle branch block, Takotsubo cardiomyopathy with subsequent recovery of EF.  Most recent echo 11/10/2020 showed EF 50 to 55%, no significant valvular normalities  abnormalities.  Last seen by Dr. Acie Fredrickson 11/12/2020 and at that time was noting some increased fatigue/dyspnea.  It was noted that this was likely not due to cardiac dysfunction given improvement in LV function.    Ultimately, patient presented to the ED on 11/19/2020 with chief complaint of shortness of breath and was hospitalized for work-up.  She was discharged on 11/23/2020.  Per discharge summary, "Dyspneawith significant worsening on exertion/concern for aspiration: CT scan did not show anything obvious. Symptoms thought to be multifactorial. Chronic aspiration is a distinct possibility due to her severe GERD. Pulmonary edema was also thought to be a possibility. Echocardiogram done recently showed normal systolic function. Pulmonology was consulted. Patient was given Lasix without any appreciable improvement in her symptoms. Saturations were normal on room air. Subsequently started on metronidazole along with steroids. Showed improvement with this treatment regimen. Also maintained on PPI. Chest x-ray showed bibasilar atelectasis without acute findings. Patient feels much better today. Refuses to go to skilled nursing facility. Agreeable to home health. Discussed with daughter as well. We will send ambulatory referral to pulmonology for outpatient follow-up."  At PAT appointment, patient reported her breathing was back to baseline.  Denied shortness of breath.  Preop labs reviewed, unremarkable.  DM2 not well controlled with A1c 7.7.  EKG 11/19/20: NSR. Rate 98. LBBB.  CTA chest 11/19/20: IMPRESSION: No evidence of pulmonary embolism.  Subsegmental atelectasis in the dependent aspects of the lungs without other acute findings in the chest.  TTE 11/10/20:  1. Left ventricular ejection fraction, by estimation, is 50 to  55%. The  left ventricle has low normal function. There is mild left ventricular  hypertrophy. Left ventricular diastolic parameters are indeterminate.  2. Right  ventricular systolic function is normal. The right ventricular  size is normal.  3. Trivial mitral valve regurgitation.  4. The aortic valve is normal in structure. Aortic valve regurgitation is  not visualized.   Comparison(s): The left ventricular function is unchanged.  )      Anesthesia Quick Evaluation

## 2021-02-04 NOTE — Progress Notes (Signed)
Anesthesia Chart Review:  Follows with cardiology for history of HTN, left bundle branch block, Takotsubo cardiomyopathy with subsequent recovery of EF.  Most recent echo 11/10/2020 showed EF 50 to 55%, no significant valvular normalities abnormalities.  Last seen by Dr. Acie Fredrickson 11/12/2020 and at that time was noting some increased fatigue/dyspnea.  It was noted that this was likely not due to cardiac dysfunction given improvement in LV function.    Ultimately, patient presented to the ED on 11/19/2020 with chief complaint of shortness of breath and was hospitalized for work-up.  She was discharged on 11/23/2020.  Per discharge summary, "Dyspnea with significant worsening on exertion/concern for aspiration: CT scan did not show anything obvious.  Symptoms thought to be multifactorial.  Chronic aspiration is a distinct possibility due to her severe GERD.  Pulmonary edema was also thought to be a possibility.  Echocardiogram done recently showed normal systolic function. Pulmonology was consulted.  Patient was given Lasix without any appreciable improvement in her symptoms. Saturations were normal on room air. Subsequently started on metronidazole along with steroids.  Showed improvement with this treatment regimen.  Also maintained on PPI.  Chest x-ray showed bibasilar atelectasis without acute findings.  Patient feels much better today.  Refuses to go to skilled nursing facility.  Agreeable to home health.  Discussed with daughter as well.  We will send ambulatory referral to pulmonology for outpatient follow-up."  At PAT appointment, patient reported her breathing was back to baseline.  Denied shortness of breath.  Preop labs reviewed, unremarkable.  DM2 not well controlled with A1c 7.7.  EKG 11/19/20: NSR. Rate 98. LBBB.  CTA chest 11/19/20: IMPRESSION: No evidence of pulmonary embolism.   Subsegmental atelectasis in the dependent aspects of the lungs without other acute findings in the chest.  TTE  11/10/20:   1. Left ventricular ejection fraction, by estimation, is 50 to 55%. The  left ventricle has low normal function. There is mild left ventricular  hypertrophy. Left ventricular diastolic parameters are indeterminate.   2. Right ventricular systolic function is normal. The right ventricular  size is normal.   3. Trivial mitral valve regurgitation.   4. The aortic valve is normal in structure. Aortic valve regurgitation is  not visualized.   Comparison(s): The left ventricular function is unchanged.     Wynonia Musty [facility]/Anesthesiology Phone [phone] 02/04/2021 2:29 PM

## 2021-02-05 ENCOUNTER — Encounter (HOSPITAL_COMMUNITY)
Admission: RE | Disposition: A | Discharge status: Home / Self Care | Payer: Self-pay | Source: Home / Self Care | Attending: Neurosurgery

## 2021-02-05 ENCOUNTER — Ambulatory Visit (HOSPITAL_COMMUNITY): Payer: Medicare Other

## 2021-02-05 ENCOUNTER — Encounter (HOSPITAL_COMMUNITY): Payer: Self-pay

## 2021-02-05 ENCOUNTER — Other Ambulatory Visit: Payer: Self-pay

## 2021-02-05 ENCOUNTER — Ambulatory Visit (HOSPITAL_COMMUNITY): Payer: Medicare Other | Admitting: Physician Assistant

## 2021-02-05 ENCOUNTER — Ambulatory Visit (HOSPITAL_COMMUNITY)
Admission: RE | Admit: 2021-02-05 | Discharge: 2021-02-05 | Disposition: A | Discharge status: Home / Self Care | Payer: Medicare Other | Attending: Neurosurgery | Admitting: Neurosurgery

## 2021-02-05 DIAGNOSIS — Z87891 Personal history of nicotine dependence: Secondary | ICD-10-CM | POA: Diagnosis not present

## 2021-02-05 DIAGNOSIS — M5116 Intervertebral disc disorders with radiculopathy, lumbar region: Secondary | ICD-10-CM | POA: Diagnosis not present

## 2021-02-05 DIAGNOSIS — M4316 Spondylolisthesis, lumbar region: Secondary | ICD-10-CM | POA: Diagnosis not present

## 2021-02-05 DIAGNOSIS — I509 Heart failure, unspecified: Secondary | ICD-10-CM | POA: Insufficient documentation

## 2021-02-05 DIAGNOSIS — K219 Gastro-esophageal reflux disease without esophagitis: Secondary | ICD-10-CM | POA: Diagnosis not present

## 2021-02-05 DIAGNOSIS — E1165 Type 2 diabetes mellitus with hyperglycemia: Secondary | ICD-10-CM | POA: Insufficient documentation

## 2021-02-05 DIAGNOSIS — Z419 Encounter for procedure for purposes other than remedying health state, unspecified: Secondary | ICD-10-CM

## 2021-02-05 DIAGNOSIS — I110 Hypertensive heart disease with heart failure: Secondary | ICD-10-CM | POA: Diagnosis not present

## 2021-02-05 HISTORY — PX: LUMBAR LAMINECTOMY/ DECOMPRESSION WITH MET-RX: SHX5959

## 2021-02-05 LAB — GLUCOSE, CAPILLARY
Glucose-Capillary: 131 mg/dL — ABNORMAL HIGH (ref 70–99)
Glucose-Capillary: 125 mg/dL — ABNORMAL HIGH (ref 70–99)
Glucose-Capillary: 142 mg/dL — ABNORMAL HIGH (ref 70–99)

## 2021-02-05 SURGERY — LUMBAR LAMINECTOMY/ DECOMPRESSION WITH MET-RX
Anesthesia: General | Site: Spine Lumbar | Laterality: Right

## 2021-02-05 MED ORDER — THROMBIN 5000 UNITS EX KIT
PACK | CUTANEOUS | Status: DC | PRN
  Administered 2021-02-05 (×2): 5000 [IU] via TOPICAL

## 2021-02-05 MED ORDER — SUGAMMADEX SODIUM 200 MG/2ML IV SOLN
INTRAVENOUS | Status: DC | PRN
  Administered 2021-02-05: 200 mg via INTRAVENOUS

## 2021-02-05 MED ORDER — 0.9 % SODIUM CHLORIDE (POUR BTL) OPTIME
TOPICAL | Status: DC | PRN
  Administered 2021-02-05: 16:00:00 1000 mL

## 2021-02-05 MED ORDER — EPHEDRINE 5 MG/ML INJ
INTRAVENOUS | Status: AC
  Filled 2021-02-05: qty 5

## 2021-02-05 MED ORDER — HYDROCODONE-ACETAMINOPHEN 5-325 MG PO TABS: 1.0000 | ORAL_TABLET | Freq: Four times a day (QID) | ORAL | 0 refills | Status: DC | PRN

## 2021-02-05 MED ORDER — LIDOCAINE 2% (20 MG/ML) 5 ML SYRINGE
INTRAMUSCULAR | Status: AC
  Filled 2021-02-05: qty 5

## 2021-02-05 MED ORDER — PHENYLEPHRINE 40 MCG/ML (10ML) SYRINGE FOR IV PUSH (FOR BLOOD PRESSURE SUPPORT)
PREFILLED_SYRINGE | INTRAVENOUS | Status: AC
  Filled 2021-02-05: qty 10

## 2021-02-05 MED ORDER — ORAL CARE MOUTH RINSE: 15.0000 mL | Freq: Once | OROMUCOSAL | Status: AC

## 2021-02-05 MED ORDER — CHLORHEXIDINE GLUCONATE 0.12 % MT SOLN
OROMUCOSAL | Status: AC
  Administered 2021-02-05: 11:00:00 15 mL via OROMUCOSAL
  Filled 2021-02-05: qty 15

## 2021-02-05 MED ORDER — CHLORHEXIDINE GLUCONATE CLOTH 2 % EX PADS: 6.0000 | MEDICATED_PAD | Freq: Once | CUTANEOUS | Status: DC

## 2021-02-05 MED ORDER — DOCUSATE SODIUM 100 MG PO CAPS: 100.0000 mg | ORAL_CAPSULE | Freq: Two times a day (BID) | ORAL | 2 refills | Status: AC

## 2021-02-05 MED ORDER — VANCOMYCIN HCL IN DEXTROSE 1-5 GM/200ML-% IV SOLN
INTRAVENOUS | Status: AC
  Administered 2021-02-05: 11:00:00 1000 mg via INTRAVENOUS
  Filled 2021-02-05: qty 200

## 2021-02-05 MED ORDER — FENTANYL CITRATE (PF) 250 MCG/5ML IJ SOLN
INTRAMUSCULAR | Status: AC
  Filled 2021-02-05: qty 5

## 2021-02-05 MED ORDER — METHYLPREDNISOLONE ACETATE 80 MG/ML IJ SUSP
INTRAMUSCULAR | Status: AC
  Filled 2021-02-05: qty 1

## 2021-02-05 MED ORDER — ROCURONIUM BROMIDE 10 MG/ML (PF) SYRINGE
PREFILLED_SYRINGE | INTRAVENOUS | Status: DC | PRN
  Administered 2021-02-05: 50 mg via INTRAVENOUS

## 2021-02-05 MED ORDER — BUPIVACAINE HCL (PF) 0.5 % IJ SOLN
INTRAMUSCULAR | Status: AC
  Filled 2021-02-05: qty 30

## 2021-02-05 MED ORDER — HEMOSTATIC AGENTS (NO CHARGE) OPTIME
TOPICAL | Status: DC | PRN
  Administered 2021-02-05: 1 via TOPICAL

## 2021-02-05 MED ORDER — PHENYLEPHRINE HCL (PRESSORS) 10 MG/ML IV SOLN
INTRAVENOUS | Status: DC | PRN
  Administered 2021-02-05: 80 ug via INTRAVENOUS
  Administered 2021-02-05: 200 ug via INTRAVENOUS
  Administered 2021-02-05: 120 ug via INTRAVENOUS

## 2021-02-05 MED ORDER — DEXAMETHASONE SODIUM PHOSPHATE 4 MG/ML IJ SOLN
INTRAMUSCULAR | Status: AC
  Filled 2021-02-05: qty 2

## 2021-02-05 MED ORDER — FENTANYL CITRATE (PF) 100 MCG/2ML IJ SOLN
INTRAMUSCULAR | Status: AC
  Filled 2021-02-05: qty 2

## 2021-02-05 MED ORDER — VANCOMYCIN HCL IN DEXTROSE 1-5 GM/200ML-% IV SOLN: 1000.0000 mg | INTRAVENOUS | Status: AC

## 2021-02-05 MED ORDER — SUCCINYLCHOLINE CHLORIDE 200 MG/10ML IV SOSY
PREFILLED_SYRINGE | INTRAVENOUS | Status: AC
  Filled 2021-02-05: qty 10

## 2021-02-05 MED ORDER — PROPOFOL 10 MG/ML IV BOLUS
INTRAVENOUS | Status: DC | PRN
  Administered 2021-02-05: 80 mg via INTRAVENOUS

## 2021-02-05 MED ORDER — SUCCINYLCHOLINE CHLORIDE 200 MG/10ML IV SOSY
PREFILLED_SYRINGE | INTRAVENOUS | Status: DC | PRN
  Administered 2021-02-05: 120 mg via INTRAVENOUS

## 2021-02-05 MED ORDER — FENTANYL CITRATE (PF) 100 MCG/2ML IJ SOLN
25.0000 ug | INTRAMUSCULAR | Status: DC | PRN
  Administered 2021-02-05: 18:00:00 50 ug via INTRAVENOUS

## 2021-02-05 MED ORDER — ONDANSETRON HCL 4 MG/2ML IJ SOLN
INTRAMUSCULAR | Status: DC | PRN
  Administered 2021-02-05: 4 mg via INTRAVENOUS

## 2021-02-05 MED ORDER — ROCURONIUM BROMIDE 10 MG/ML (PF) SYRINGE
PREFILLED_SYRINGE | INTRAVENOUS | Status: AC
  Filled 2021-02-05: qty 10

## 2021-02-05 MED ORDER — LIDOCAINE 2% (20 MG/ML) 5 ML SYRINGE
INTRAMUSCULAR | Status: DC | PRN
  Administered 2021-02-05: 60 mg via INTRAVENOUS

## 2021-02-05 MED ORDER — ONDANSETRON HCL 4 MG/2ML IJ SOLN
INTRAMUSCULAR | Status: AC
  Filled 2021-02-05: qty 2

## 2021-02-05 MED ORDER — LIDOCAINE-EPINEPHRINE 1 %-1:100000 IJ SOLN
INTRAMUSCULAR | Status: DC | PRN
  Administered 2021-02-05: 2 mL

## 2021-02-05 MED ORDER — METHYLPREDNISOLONE ACETATE 80 MG/ML IJ SUSP
INTRAMUSCULAR | Status: DC | PRN
  Administered 2021-02-05: 80 mg

## 2021-02-05 MED ORDER — BUPIVACAINE HCL 0.5 % IJ SOLN
INTRAMUSCULAR | Status: DC | PRN
  Administered 2021-02-05: 2 mL

## 2021-02-05 MED ORDER — EPHEDRINE SULFATE-NACL 50-0.9 MG/10ML-% IV SOSY
PREFILLED_SYRINGE | INTRAVENOUS | Status: DC | PRN
  Administered 2021-02-05: 10 mg via INTRAVENOUS

## 2021-02-05 MED ORDER — THROMBIN 5000 UNITS EX SOLR
CUTANEOUS | Status: AC
  Filled 2021-02-05: qty 15000

## 2021-02-05 MED ORDER — CHLORHEXIDINE GLUCONATE 0.12 % MT SOLN: 15.0000 mL | Freq: Once | OROMUCOSAL | Status: AC

## 2021-02-05 MED ORDER — LACTATED RINGERS IV SOLN: INTRAVENOUS | Status: DC

## 2021-02-05 MED ORDER — FENTANYL CITRATE (PF) 100 MCG/2ML IJ SOLN
INTRAMUSCULAR | Status: DC | PRN
  Administered 2021-02-05: 50 ug via INTRAVENOUS
  Administered 2021-02-05: 25 ug via INTRAVENOUS
  Administered 2021-02-05: 50 ug via INTRAVENOUS
  Administered 2021-02-05: 25 ug via INTRAVENOUS
  Administered 2021-02-05: 100 ug via INTRAVENOUS

## 2021-02-05 MED ORDER — PROPOFOL 10 MG/ML IV BOLUS
INTRAVENOUS | Status: AC
  Filled 2021-02-05: qty 20

## 2021-02-05 MED ORDER — DEXAMETHASONE SODIUM PHOSPHATE 10 MG/ML IJ SOLN
INTRAMUSCULAR | Status: AC
  Filled 2021-02-05: qty 1

## 2021-02-05 MED ORDER — THROMBIN 5000 UNITS EX SOLR: OROMUCOSAL | Status: DC | PRN

## 2021-02-05 MED ORDER — PHENYLEPHRINE HCL-NACL 20-0.9 MG/250ML-% IV SOLN
INTRAVENOUS | Status: DC | PRN
  Administered 2021-02-05: 30 ug/min via INTRAVENOUS

## 2021-02-05 MED ORDER — LIDOCAINE-EPINEPHRINE 1 %-1:100000 IJ SOLN
INTRAMUSCULAR | Status: AC
  Filled 2021-02-05: qty 1

## 2021-02-05 MED ORDER — AMISULPRIDE (ANTIEMETIC) 5 MG/2ML IV SOLN
10.0000 mg | Freq: Once | INTRAVENOUS | Status: AC
  Administered 2021-02-05: 18:00:00 10 mg via INTRAVENOUS
  Filled 2021-02-05: qty 4

## 2021-02-05 SURGICAL SUPPLY — 58 items
ADH SKN CLS APL DERMABOND .7 (GAUZE/BANDAGES/DRESSINGS) ×1
APL SKNCLS STERI-STRIP NONHPOA (GAUZE/BANDAGES/DRESSINGS)
BAG COUNTER SPONGE SURGICOUNT (BAG) ×2 IMPLANT
BAG SPNG CNTER NS LX DISP (BAG) ×1
BAND INSRT 18 STRL LF DISP RB (MISCELLANEOUS) ×2
BAND RUBBER #18 3X1/16 STRL (MISCELLANEOUS) ×4 IMPLANT
BENZOIN TINCTURE PRP APPL 2/3 (GAUZE/BANDAGES/DRESSINGS) IMPLANT
BLADE CLIPPER SURG (BLADE) IMPLANT
BUR CARBIDE MATCH 3.0 (BURR) IMPLANT
BUR PRECISION MATCH 3.0 13 (BURR) ×2 IMPLANT
CANISTER SUCT 3000ML PPV (MISCELLANEOUS) ×2 IMPLANT
DECANTER SPIKE VIAL GLASS SM (MISCELLANEOUS) ×2 IMPLANT
DERMABOND ADVANCED (GAUZE/BANDAGES/DRESSINGS) ×1
DERMABOND ADVANCED .7 DNX12 (GAUZE/BANDAGES/DRESSINGS) IMPLANT
DRAPE C-ARM 42X72 X-RAY (DRAPES) ×4 IMPLANT
DRAPE LAPAROTOMY 100X72X124 (DRAPES) ×2 IMPLANT
DRAPE MICROSCOPE LEICA (MISCELLANEOUS) ×2 IMPLANT
DRAPE SURG 17X23 STRL (DRAPES) ×2 IMPLANT
DRSG MEPILEX BORDER 4X4 (GAUZE/BANDAGES/DRESSINGS) ×2 IMPLANT
DRSG OPSITE POSTOP 3X4 (GAUZE/BANDAGES/DRESSINGS) ×1 IMPLANT
DURAPREP 26ML APPLICATOR (WOUND CARE) ×2 IMPLANT
ELECT BLADE INSULATED 4IN (ELECTROSURGICAL) ×2
ELECT REM PT RETURN 9FT ADLT (ELECTROSURGICAL) ×2
ELECTRODE BLADE INSULATED 4IN (ELECTROSURGICAL) ×1 IMPLANT
ELECTRODE REM PT RTRN 9FT ADLT (ELECTROSURGICAL) ×1 IMPLANT
GAUZE 4X4 16PLY ~~LOC~~+RFID DBL (SPONGE) IMPLANT
GAUZE SPONGE 4X4 12PLY STRL (GAUZE/BANDAGES/DRESSINGS) IMPLANT
GLOVE SURG LTX SZ7.5 (GLOVE) ×2 IMPLANT
GLOVE SURG UNDER POLY LF SZ7.5 (GLOVE) ×4 IMPLANT
GOWN STRL REUS W/ TWL LRG LVL3 (GOWN DISPOSABLE) ×2 IMPLANT
GOWN STRL REUS W/ TWL XL LVL3 (GOWN DISPOSABLE) IMPLANT
GOWN STRL REUS W/TWL 2XL LVL3 (GOWN DISPOSABLE) IMPLANT
GOWN STRL REUS W/TWL LRG LVL3 (GOWN DISPOSABLE) ×4
GOWN STRL REUS W/TWL XL LVL3 (GOWN DISPOSABLE)
HEMOSTAT POWDER KIT SURGIFOAM (HEMOSTASIS) ×2 IMPLANT
IV CATH AUTO 14GX1.75 SAFE ORG (IV SOLUTION) ×2 IMPLANT
KIT BASIN OR (CUSTOM PROCEDURE TRAY) ×2 IMPLANT
KIT TURNOVER KIT B (KITS) ×2 IMPLANT
NDL HYPO 18GX1.5 BLUNT FILL (NEEDLE) IMPLANT
NDL SPNL 18GX3.5 QUINCKE PK (NEEDLE) IMPLANT
NEEDLE HYPO 18GX1.5 BLUNT FILL (NEEDLE) IMPLANT
NEEDLE HYPO 25X1 1.5 SAFETY (NEEDLE) ×2 IMPLANT
NEEDLE SPNL 18GX3.5 QUINCKE PK (NEEDLE) IMPLANT
NS IRRIG 1000ML POUR BTL (IV SOLUTION) ×2 IMPLANT
PACK LAMINECTOMY NEURO (CUSTOM PROCEDURE TRAY) ×2 IMPLANT
PAD ARMBOARD 7.5X6 YLW CONV (MISCELLANEOUS) ×6 IMPLANT
SPONGE SURGIFOAM ABS GEL SZ50 (HEMOSTASIS) ×2 IMPLANT
SPONGE T-LAP 4X18 ~~LOC~~+RFID (SPONGE) IMPLANT
STRIP CLOSURE SKIN 1/2X4 (GAUZE/BANDAGES/DRESSINGS) IMPLANT
SUT MNCRL AB 4-0 PS2 18 (SUTURE) ×2 IMPLANT
SUT VIC AB 0 CT1 18XCR BRD8 (SUTURE) IMPLANT
SUT VIC AB 0 CT1 8-18 (SUTURE)
SUT VIC AB 2-0 CP2 18 (SUTURE) ×2 IMPLANT
SUT VIC AB 3-0 SH 8-18 (SUTURE) ×2 IMPLANT
SYR 3ML LL SCALE MARK (SYRINGE) IMPLANT
TOWEL GREEN STERILE (TOWEL DISPOSABLE) ×2 IMPLANT
TOWEL GREEN STERILE FF (TOWEL DISPOSABLE) ×2 IMPLANT
WATER STERILE IRR 1000ML POUR (IV SOLUTION) ×2 IMPLANT

## 2021-02-05 NOTE — Anesthesia Postprocedure Evaluation (Signed)
Anesthesia Post Note  Patient: Kayla Medina  Procedure(s) Performed: RIGHT LUMBAR FOUR-FIVE LUMBAR LAMINECTOMY/ DECOMPRESSION WITH MET-RX (Right: Spine Lumbar)     Patient location during evaluation: PACU Anesthesia Type: General Level of consciousness: awake and alert Pain management: pain level controlled Vital Signs Assessment: post-procedure vital signs reviewed and stable Respiratory status: spontaneous breathing, nonlabored ventilation, respiratory function stable and patient connected to nasal cannula oxygen Cardiovascular status: blood pressure returned to baseline and stable Postop Assessment: no apparent nausea or vomiting Anesthetic complications: no   No notable events documented.  Last Vitals:  Vitals:   02/05/21 1834 02/05/21 1849  BP: (!) 149/73 (!) 158/71  Pulse: 77 78  Resp: 19 18  Temp:  37 C  SpO2: 92% 97%    Last Pain:  Vitals:   02/05/21 1849  TempSrc:   PainSc: 3                  [name] P [name]

## 2021-02-05 NOTE — Transfer of Care (Signed)
Immediate Anesthesia Transfer of Care Note  Patient: Kayla Medina  Procedure(s) Performed: RIGHT LUMBAR FOUR-FIVE LUMBAR LAMINECTOMY/ DECOMPRESSION WITH MET-RX (Right: Spine Lumbar)  Patient Location: PACU  Anesthesia Type:General  Level of Consciousness: drowsy, patient cooperative and responds to stimulation  Airway & Oxygen Therapy: Patient Spontanous Breathing and Patient connected to nasal cannula oxygen  Post-op Assessment: Report given to RN, Post -op Vital signs reviewed and stable and Patient moving all extremities X 4  Post vital signs: Reviewed and stable  Last Vitals:  Vitals Value Taken Time  BP 152/75 02/05/21 1649  Temp    Pulse 89 02/05/21 1650  Resp 26 02/05/21 1650  SpO2 97 % 02/05/21 1650  Vitals shown include unvalidated device data.  Last Pain:  Vitals:   02/05/21 1116  TempSrc:   PainSc: 5       Patients Stated Pain Goal: 0 (31/12/16 2446)  Complications: No notable events documented.

## 2021-02-05 NOTE — Discharge Instructions (Addendum)
Can remove transparent dressing and shower 48 hours after surgery Walk as much as possible No heavy lifting >10 lbs No excessive bending/twisting at the waist  

## 2021-02-05 NOTE — Anesthesia Procedure Notes (Signed)
Procedure Name: Intubation Date/Time: 02/05/2021 2:43 PM Performed by: Cathren Harsh, CRNA Pre-anesthesia Checklist: Patient identified, Emergency Drugs available, Suction available and Patient being monitored Patient Re-evaluated:Patient Re-evaluated prior to induction Oxygen Delivery Method: Circle System Utilized Preoxygenation: Pre-oxygenation with 100% oxygen Induction Type: IV induction, Cricoid Pressure applied and Rapid sequence Laryngoscope Size: Mac and 3 Grade View: Grade III Tube type: Oral Tube size: 7.0 mm Number of attempts: 1 Airway Equipment and Method: Stylet and Oral airway Placement Confirmation: ETT inserted through vocal cords under direct vision, positive ETCO2 and breath sounds checked- equal and bilateral Secured at: 21 cm Tube secured with: Tape Dental Injury: Teeth and Oropharynx as per pre-operative assessment  Difficulty Due To: Difficult Airway- due to anterior larynx

## 2021-02-05 NOTE — Op Note (Signed)
PREOP DIAGNOSIS: Herniated nucleus pulposus at L4-5 with radiculopathy  POSTOP DIAGNOSIS: Herniated nucleus pulposus at L4-5 with radiculopathy  PROCEDURE: 1. Right L4-5 laminotomy, medial facetectomy for excision of herniated disc using tubular retractor system 2.  Use of microscope for microdissection  SURGEON: Dr. Duffy Rhody, MD  ASSISTANT: None   ANESTHESIA: General Endotracheal  EBL: 25 ml  SPECIMENS: None  DRAINS: None  COMPLICATIONS: none  CONDITION: Stable to PCAU  HISTORY: Kayla Medina is a 87 y.o. female who initially presented to the outpatient clinic with right lumbar radiculopathy. MRI showed a large L4-5 herniated disc with inferior migration.  The patient had failed nonsurgical management.  Therefore, microdiscectomy was offered to the patient.  Risks, benefits, alternatives, and expected convalescence were discussed.  Risks discussed included, but were not limited to, bleeding, pain, infection, scar, recurrent disc, instability, CSF leak, weakness, numbness, paralysis, and death.  T informed consent was obtained and the patient wished to proceed.  PROCEDURE IN DETAIL: The patient was brought to the operating room. After induction of general anesthesia, the patient was positioned on the operative table in the prone position on a Wilson frame with all pressure points meticulously padded. The skin of the low back was then prepped and draped in the usual sterile fashion.  Under fluoroscopy, the correct levels were identified and marked out on the skin, and after timeout was conducted, the skin was infiltrated with local anesthetic.  Paramedian skin incision was then made sharply over the affected level and the subcutaneous tissue and fascia were incised.  Initial dilator was then passed to the interspace under fluoroscopic guidance.  The paraspinous muscle attachments were dissected from the L4 lamina using the dilators.  Successive dilators were used until a 20 mm  tubular retractor was placed under fluoroscopic guidance and locked in place with an attachment arm.  Microscope was then introduced into the field.  Small amount of remaining musculature was dissected from the lamina and the interspace was visualized as well.  The medial facet was also exposed.  High-speed drill was used to perform a laminotomy as well as a modest medial facetectomy.  The ligamentum flavum was then removed with rongeurs.  The thecal sac and traversing nerve root were identified.  The epidural disc space was dissected with a 4 Penfield.  Epidural veins were coagulated.  Upon retracting the nerve root medially, the large disc herniation was encountered just inferior to the disc space.  The disc herniation was incised with a 15 blade and the disc was dissected from the nerve root with nerve hooks.  It was partially calcified and fairly adherent.  Pituitary rongeur was used to remove the large disc fragment.  Smaller additional fragments were also removed.  There was also posterior herniation from the disc space causing some deflection of the traversing nerve root.  An annulotomy was performed and the herniated paracentral portion of the disc was removed with rongeurs.  Following removal of the herniated disc, the nerve root appeared much more relaxed.  Nerve hook was used to ensure good decompression and there was no longer significant's impingement of the nerve roots.  Meticulous hemostasis was obtained.  The wound was irrigated thoroughly with bacitracin impregnated irrigation.  Depo-Medrol was then placed over the nerve root.  The tubular retractor was then withdrawn with hemostasis in the muscle obtained with bipolar.  The muscles were injected with half percent Marcaine.  The fascia was closed with 0 Vicryl stitches.  The dermal layer was closed  with 2-0 Vicryl stitches in buried interrupted fashion.  The skin was closed with 4-0 Monocryl in subcuticular manner followed by Dermabond.  A sterile  dressing was placed.  Patient was then flipped supine and extubated by the anesthesia service.  All counts were correct at the end of surgery.  No complications were noted.

## 2021-02-05 NOTE — H&P (Signed)
CC: back and right leg pain  HPI:     This is a 87 year old woman with CHF, DM who presents for evaluation of severe right leg sciatica.  6 weeks ago, pain in her posterior hip/buttock radiating to dorsum of her right foot.  It is worse with walking to the point it is very hard for her to get around.  Prednisone taper has helped, but now that she is coming off the prednisone it has returned.   Tramadol did not help.  She attempted physical therapy but it made her pain worse.  It is very hard to stand up.  She denies any mechanical back pain or midline back pain.  She previously was very functional.  The pain was so severe that when she went to the emergency room, she was noted to be tachypneic and had a medical workup which she states revealed no active cardiopulmonary issues.  She suffered a fall in Feb which prompted a previous MRI which did not show a herniated disc at that time.    Patient Active Problem List   Diagnosis Date Noted   Dyspnea on exertion 11/19/2020   Hypothyroidism 11/19/2020   Gastritis 11/19/2020   CKD (chronic kidney disease) stage 3, GFR 30-59 ml/min (HCC) 11/19/2020   Hyponatremia 11/19/2020   FTT (failure to thrive) in adult 14/78/2956   Chronic systolic CHF (congestive heart failure) ([place]) 09/10/2014   HTN (hypertension) 06/12/2014   Takotsubo syndrome 05/10/2014   Chest pain 04/23/2014   Dyspnea 04/23/2014   Obstructive thrombus 04/23/2014   Orthostasis 06/01/2013   Ataxia 06/01/2013   Type 2 diabetes mellitus without complication ([place]) 21/30/8657   Hyperlipidemia 04/17/2009   CHEST PAIN-UNSPECIFIED 04/17/2009   PALPITATIONS 04/14/2009   DYSPNEA 04/14/2009   Past Medical History:  Diagnosis Date   Anal fissure    Ataxia 06/01/2013   Chest pain, unspecified    Chronic systolic CHF (congestive heart failure) ([place]) 09/10/2014   Diabetes mellitus without complication (HCC)    GERD (gastroesophageal reflux disease)    Glaucoma    pt. states no-glaucoma  07/20/13   Hypertension    Obstructive thrombus 04/23/2014   Orthostasis 06/01/2013   Other and unspecified hyperlipidemia    Palpitations    Pneumonia    PVC (premature ventricular contraction)    Rosacea    Shingles    hx   Shortness of breath    Subclinical hypothyroidism    Takotsubo syndrome 05/10/2014    Past Surgical History:  Procedure Laterality Date   APPENDECTOMY     bilateral eye surgery/cataract repair  05/21/2007   BLADDER REPAIR     BOWEL RESECTION     DILATION AND CURETTAGE OF UTERUS     EYE SURGERY Bilateral    LEFT HEART CATHETERIZATION WITH CORONARY ANGIOGRAM N/A 04/24/2014   Procedure: LEFT HEART CATHETERIZATION WITH CORONARY ANGIOGRAM;  Surgeon: Wellington Hampshire, MD;  Location: [facility];  Service: Cardiovascular;  Laterality: N/A;   TOTAL ABDOMINAL HYSTERECTOMY      Medications Prior to Admission  Medication Sig Dispense Refill Last Dose   acetaminophen (TYLENOL) 650 MG CR tablet Take 650-1,300 mg by mouth 2 (two) times daily as needed for pain.   Past Week   ALPRAZolam (XANAX) 0.5 MG tablet Take 0.5 mg by mouth at bedtime.   02/04/2021   atorvastatin (LIPITOR) 10 MG tablet Take 10 mg by mouth at bedtime.   02/04/2021   ENTRESTO 49-51 MG Take 1 tablet by mouth twice daily (Patient taking differently:  Take 1 tablet by mouth 2 (two) times daily.) 180 tablet 3 02/04/2021   EYSUVIS 0.25 % SUSP Place 1 drop into both eyes in the morning, at noon, in the evening, and at bedtime.   02/05/2021 at 0530   levothyroxine (SYNTHROID) 75 MCG tablet Take 0.5 tablets (37.5 mcg total) by mouth daily before breakfast. (Patient taking differently: Take 50 mcg by mouth daily before breakfast.) 15 tablet 1 02/05/2021 at 0530   metFORMIN (GLUCOPHAGE) 1000 MG tablet Take 1 tablet (1,000 mg total) by mouth 2 (two) times daily.   02/04/2021   nitroGLYCERIN (NITROSTAT) 0.4 MG SL tablet Place 1 tablet (0.4 mg total) under the tongue every 5 (five) minutes as needed for chest pain. 25  tablet 6    ondansetron (ZOFRAN) 4 MG tablet Take 1 tablet (4 mg total) by mouth every 8 (eight) hours as needed for nausea or vomiting. 20 tablet 0 Past Week   Polyethyl Glycol-Propyl Glycol (SYSTANE) 0.4-0.3 % GEL ophthalmic gel Place 1 application into both eyes at bedtime.   02/04/2021   benzonatate (TESSALON) 100 MG capsule Take 1 capsule (100 mg total) by mouth 3 (three) times daily as needed for cough. (Patient not taking: Reported on 02/03/2021) 20 capsule 0 Not Taking   Blood Glucose Monitoring Suppl ([place]) w/Device KIT 1 each by Other route. Check blood sugar every morning.      cyclobenzaprine (FLEXERIL) 5 MG tablet Take 5 mg by mouth every 8 (eight) hours as needed for muscle spasms.   More than a month   desonide (DESOWEN) 0.05 % cream Apply 1 application topically 2 (two) times daily as needed (for itching/dry skin).   More than a month   HYDROcodone-acetaminophen (NORCO/VICODIN) 5-325 MG tablet Take 0.25 tablets by mouth every 6 (six) hours as needed. (Patient not taking: Reported on 02/03/2021)   Not Taking   ipratropium-albuterol (DUONEB) 0.5-2.5 (3) MG/3ML SOLN Take 3 mLs by nebulization every 6 (six) hours as needed. (Patient not taking: Reported on 02/03/2021) 360 mL 0 Not Taking   meclizine (ANTIVERT) 25 MG tablet Take 1 tablet (25 mg total) by mouth 3 (three) times daily as needed for dizziness. 30 tablet 0 More than a month   metroNIDAZOLE (METROCREAM) 0.75 % cream Apply 1 application topically daily as needed (to all itchy sites- as directed).    More than a month   mometasone-formoterol (DULERA) 100-5 MCG/ACT AERO Inhale 2 puffs into the lungs 2 (two) times daily. (Patient not taking: Reported on 02/03/2021) 1 each 1 Not Taking   ONETOUCH VERIO test strip 1 each by Other route. Check blood sugar every morning      traMADol (ULTRAM) 50 MG tablet Take 50 mg by mouth every 6 (six) hours as needed. (Patient not taking: Reported on 02/03/2021)   Not Taking    Allergies  Allergen Reactions   Aspirin Other (See Comments)    Unknown reaction    Barbiturates Other (See Comments)    Unknown reaction    Codeine Nausea And Vomiting   Januvia [Sitagliptin] Nausea And Vomiting   Meperidine Hcl Itching   Other Nausea And Vomiting    *all narcotic meds*   Penicillins Hives    Has patient had a PCN reaction causing immediate rash, facial/tongue/throat swelling, SOB or lightheadedness with hypotension: Yes Has patient had a PCN reaction causing severe rash involving mucus membranes or skin necrosis: No Has patient had a PCN reaction that required hospitalization: No Has patient had a PCN reaction  occurring within the last 10 years: No If all of the above answers are "NO", then may proceed with Cephalosporin use.   Ramipril Other (See Comments)    Made her sick    Sulfonamide Derivatives Hives    Social History   Tobacco Use   Smoking status: Former    Packs/day: 0.01    Years: 5.00    Pack years: 0.05    Types: Cigarettes   Smokeless tobacco: Never   Tobacco comments:    Never daily tobacco use, socially only.  Substance Use Topics   Alcohol use: No    Family History  Problem Relation Age of Onset   Alzheimer's disease Mother    Hypertension Mother    Thyroid disease Mother    Alcoholism Father    Diabetes Paternal Grandmother        entire family   Diabetes Maternal Grandmother    Prostate cancer Brother    Seizures Sister    Diabetes Daughter        x 2   Thyroid disease Brother        x2    Thyroid disease Daughter    Thyroid disease Son    Other Sister        meningitis     Review of Systems Pertinent items noted in HPI and remainder of comprehensive ROS otherwise negative.  Objective:   Patient Vitals for the past 8 hrs:  BP Temp Temp src Pulse SpO2 Height Weight  02/05/21 1017 (!) 139/92 98.4 F (36.9 C) Oral 67 99 % '5\' 6"'  (1.676 m) 64.4 kg   No intake/output data recorded. No intake/output data  recorded.  General Level of Distress: no acute distress Overall Appearance: Normal  Head and Face  Right Left  Fundoscopic Exam:  unable to visualize unable to visualize    Cardiovascular  Right Left  Peripheral Pulses: normal normal  Carotid Pulses:  normal  Respiratory Lungs: non-labored  Neurological Orientation: normal Recent and Remote Memory: normal Attention Span and Concentration:   normal Language: normal Fund of Knowledge: normal  Right Left Sensation: normal normal Upper Extremity Coordination: normal normal  Lower Extremity Coordination: normal normal  Musculoskeletal Gait and Station: normal  Right Left Upper Extremity Muscle Tone:  normal  Lower Extremity Muscle Tone: normal    Motor Strength . Any abnormal findings will be noted below.   Right Left Deltoid: 5/5 5/5 Biceps: 5/5 5/5 Triceps: 5/5 5/5 Wrist Extensor: 5/5 5/5 Grip: 5/5 5/5 Finger Extensor: 5/5 5/5 Hip Flexor: 5/5 5/5 Knee Extensor: 5/5 5/5 Knee Flexor: 5/5 5/5 Tib Anterior: 5/5 5/5 EHL: 5/5 5/5 Medial Gastroc: 5/5 5/5  Gaze Normal Horizontal Gaze Stability: normal  Sensory Sensation was tested at C2 to T1 and L1 to S1.   Cranial Nerves II. Optic Nerve/Visual Fields: normal III. Oculomotor: EOMI IV. Trochlear: EOMI V. Trigeminal: sensory intact VI. Abducens: EOMI VII. Facial: no facial droop VIII. Acoustic/Vestibular: hearing intact IX. Glossopharyngeal: palate elevation symmetric X. Vagus: no hoarseness XI. Spinal Accessory: shoulder shrug full XII. Hypoglossal: tongue protrusion midline  Motor and other Tests    Right             Left SLR: positive negative   Additional Findings:  In wheelchair due to pain.  1+ patellar and Achilles reflexes bilaterally.    Data Review: Previous MRI from February 2022 was reviewed and showed multilevel degenerative disease.  Her most recent MRI performed January 20, 2021 was reviewed.  It shows a large  L4-5 disc  herniation extrusion and with inferior migration resulting in severe stenosis, and severe right L5 nerve root impingement.  She has grade 1 spondylolisthesis at this level.   Assessment:   Active Problems:   * No active hospital problems. *  This is a 87 year old woman who has severe right leg radiculopathy related to a L4-5 disc herniation.  Plan:   I had a long discussion with the patient discussed with her the natural history of disc herniations and radiculopathy.  I discussed with her the option of injections.  I also discussed with her the option of a microdiskectomy surgery.  Although she has spondylolisthesis at L4-5, she does not have any significant mechanical back pain so I think especially with her age, a simple microdiskectomy procedure would be an option.  Patient strongly preferred the more definitive route of microdiskectomy over injections.  Risks, benefits, alternatives, and expected convalescence were discussed with her and her daughter, as well as the general technique.  Risks discussed included, but were not limited to, bleeding, pain, infection, scar, recurrent disc herniation, instability, spinal fluid leak, neurologic deficit, and death.  Informed consent was obtained.

## 2021-02-06 ENCOUNTER — Encounter (HOSPITAL_COMMUNITY): Payer: Self-pay | Admitting: Neurosurgery

## 2021-02-09 ENCOUNTER — Encounter: Payer: Medicare Other | Admitting: Physical Therapy

## 2021-03-05 ENCOUNTER — Other Ambulatory Visit: Payer: Self-pay | Admitting: Cardiovascular Disease

## 2021-05-11 ENCOUNTER — Ambulatory Visit: Payer: Medicare Other | Admitting: Cardiovascular Disease

## 2021-05-19 ENCOUNTER — Other Ambulatory Visit: Payer: Self-pay | Admitting: Internal Medicine

## 2021-05-19 ENCOUNTER — Other Ambulatory Visit (HOSPITAL_COMMUNITY): Payer: Self-pay | Admitting: Internal Medicine

## 2021-05-19 DIAGNOSIS — M5416 Radiculopathy, lumbar region: Secondary | ICD-10-CM

## 2021-05-20 ENCOUNTER — Ambulatory Visit (HOSPITAL_COMMUNITY)
Admission: RE | Admit: 2021-05-20 | Discharge: 2021-05-20 | Disposition: A | Discharge status: Home / Self Care | Payer: Medicare Other | Source: Ambulatory Visit | Attending: Internal Medicine | Admitting: Internal Medicine

## 2021-05-20 ENCOUNTER — Other Ambulatory Visit: Payer: Self-pay

## 2021-05-20 DIAGNOSIS — M5416 Radiculopathy, lumbar region: Secondary | ICD-10-CM | POA: Diagnosis present

## 2021-05-20 MED ORDER — GADOBUTROL 1 MMOL/ML IV SOLN
6.5000 mL | Freq: Once | INTRAVENOUS | Status: AC | PRN
  Administered 2021-05-20: 6.5 mL via INTRAVENOUS

## 2021-06-05 ENCOUNTER — Other Ambulatory Visit: Payer: Self-pay | Admitting: Internal Medicine

## 2021-06-05 DIAGNOSIS — M5416 Radiculopathy, lumbar region: Secondary | ICD-10-CM

## 2021-06-24 ENCOUNTER — Ambulatory Visit: Payer: Medicare Other | Admitting: Cardiovascular Disease

## 2021-06-26 ENCOUNTER — Other Ambulatory Visit: Payer: Self-pay | Admitting: Cardiovascular Disease

## 2021-07-01 ENCOUNTER — Other Ambulatory Visit: Payer: Self-pay | Admitting: Cardiovascular Disease

## 2021-07-20 ENCOUNTER — Encounter: Payer: Self-pay | Admitting: Cardiovascular Disease

## 2021-07-20 ENCOUNTER — Ambulatory Visit: Payer: Medicare Other | Admitting: Cardiovascular Disease

## 2021-07-20 VITALS — BP 124/70 | HR 76 | Ht 66.0 in | Wt 145.8 lb

## 2021-07-20 DIAGNOSIS — I5022 Chronic systolic (congestive) heart failure: Secondary | ICD-10-CM

## 2021-07-20 NOTE — Patient Instructions (Signed)
Medication Instructions:  ?Your physician recommends that you continue on your current medications as directed. Please refer to the Current Medication list given to you today. ? ?*If you need a refill on your cardiac medications before your next appointment, please call your pharmacy* ? ? ?Lab Work: ?None ?If you have labs (blood work) drawn today and your tests are completely normal, you will receive your results only by: ?MyChart Message (if you have MyChart) OR ?A paper copy in the mail ?If you have any lab test that is abnormal or we need to change your treatment, we will call you to review the results. ? ? ?Follow-Up: ?At [facility], you and your health needs are our priority.  As part of our continuing mission to provide you with exceptional heart care, we have created designated Provider Care Teams.  These Care Teams include your primary Cardiologist (physician) and Advanced Practice Providers (APPs -  Physician Assistants and Nurse Practitioners) who all work together to provide you with the care you need, when you need it. ? ?Your next appointment:   ?6 month(s) ? ?The format for your next appointment:   ?In Person ? ?Provider:   ?Christen Bame, NP     { ? ?

## 2021-07-20 NOTE — Progress Notes (Signed)
? ?Cardiology Office Note ? ? ?Date:  07/20/2021  ? ?ID:  Kayla Medina, DOB [birth year], MRN [id number] ? ?PCP:  Kayla Organ., MD  ?Cardiologist:   Kayla Moores, MD  ? ?Chief Complaint  ?Patient presents with  ? Congestive Heart Failure  ?   ?  ? ?1. Takotsubo  Syndrome-  ?2.actue on chronic systolic CHF - EF 25-95% - , now 40-45  ?3. Hypertension- ?4. Diabetes mellitus ?5. LBBB ? ?   ? ?Kayla Medina is a 88 y.o. female who presents for f/u of her mild MI - Takotsubo syndrome. ?Feels weak, no CP,  Breathing is ok.  Has some DOE .  ? ?She works as a Quarry manager - Retail banker.  ?  ? ?Kayla Medina is a 88 y.o. female who presents for follow up of her Takotsubo.  BP at home is ok.  ?A bit elevated here. ?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  ? ?September 10, 2014: ?Kayla Medina's EF has fallen ( unexpectedly) .  She was diagnosed with Takotsubo  syndrome and has been on a beta blocker and angiotensin blocker since that time. Her ejection fraction has fallen to 25-30%. ? ?She was recently seen by Dr. Brigitte Medina and had some hypertension. Amlodipine was added to her medical regimen at that time. ? ?September 25, 2014: ? ?Kayla Medina is seen back today for follow up of her severe LV dysfunction ?She was started on valsartan ( instead of Losartan ) during her last visit. ?We increase her Coreg and DC'd the Amlodipine . ?Her BP has been better.  ?Breathing is better.   ?She had foot surgery 2 days ago.  Is getting along ok  ? ? ?October 30, 2014:  ? ?She is doing ok. ?No CP or dyspnea.  ? ?Nov. 28, 2016: ? ?Kayla Medina is seen today for follow up of her Takotsubo syndrome and chronic Systolic CHF. ?Hx of HTN ?BP is up a bit today -  Ate some ham over Thanksgiving . ?She is short of breath at times.  ? ?Kayla Medina, Kayla Medina: ?Has some dyspnea - particularly with exertion . ?Walking to the kitchen causes her to have dyspnea.  ?Is still eating lots of salt .   She craves salt.  ?Still eating lots of salty snacks.  ? ?Kayla Medina, Kayla Medina: ?Doing  well ?Staying fairly active ?Has some dyspnea. ? ?Oct . 23  , 2107 ? ?Doing well ?Still working PRN.   Nursing assistant .  ?Seems to be doing well. ? ?Jan. 23, 2018: ? ?Kayla Medina is doing ok.  Her last echocardiogram 11/14/Kayla Medina shows slight improvement of her left ventricle systolic function.  Her ejection fraction is now 40-45% range. ? ?Tolerating the meds well.   ? ?Feb. 27, 2018: ? ?Kayla Medina is seen today for Entresto follow up  ?Seems to be tolerating the Entresto ?She stopped the Spironolactone - she had orthostasis.  ? ?August 24, 2016 ?Kayla Medina is doing well.  ?Recent echo shows stable EF - 40-45%.  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 ?Dec. 4, 2018: ? ?Doing well   ?No CP or dyspnea ?discussed going up on the [facility]  She does not want to change any of her medications because she is doing so well. ? ?September 20, 2017:  ? ?Kayla Medina  is seen today for follow-up of her congestive heart failure.  She is doing well on the current dose of Entresto. ?  Still working - Systems analyst  ? ?Dec. 11, 2019: ? ?Kayla Medina is seen today .  Still works as a Copy  ?Went to the hospital with a pulled chest muscle.  ?Breathing is good  ?No CP  ?Tolerating Entresto  ? ? ?June 14, 2019:  ?Kayla Medina  is seen today for follow-up of her congestive heart failure and hypertension.  She has been on maximum dose Entresto for the past year.  I last saw her in the office 2 years ago.  We did a telemedicine visit last year.   Her last echocardiogram was in May, 2018 which revealed an ejection fraction of 40 to 45% and grade 1 diastolic dysfunction. ?She did not tolerate high dose entresto,  Is on Entresto 49-51 BID and is tolerating it well  ? ? ?Oct. 27, 2021: ? ?Kayla Medina is seen today for follow up of her CHF. ?Her EF has improved on Entresto and standard CHF therapy. ?Echo from Kayla, 2021 showed EF of 50%.  She has grade 1 diastolic dysfunction. ?Has developed vertigo.   Is on meclizine.  ? ?Feb. 18. 2022 ? ?Pt is  stable. ?No new complaints  ? ? ?Aug. 5, 2022: ?Kayla Medina is seen for work in visit  ?More dyspnea.for the past several weeks .  ?Short of breath all the time ?Barely able to make it to the bathroom  ?Takes all her meds   ? ?Last echo in Kayla , 2021 showed low normal EF 50 %. ?Lots of fatigue  ? ? ?November 12, 2020: ?Kayla Medina is seen today for a follow-up visit.  She has a known history of congestive heart failure.  She presented several weeks ago with progressive shortness of breath and lots of fatigue.  Her heart rate was fairly slow. ?Echocardiogram showed continued improvement of her left ventricular systolic function.  Her last EF is 50 to 55%. ?She stopped her coreg ?Is on entresto and DM meds.  ?Has been found to have gastritis  ? ?Has some tenderness of her left breast - associated with a mass or fibrous tissue.  ? ?Jul 20, 2021 ?Has had back surgery  ?Had pneumonia  ?Is now 33 ?Overall is dong well from cardiac standpoint  ? ?Past Medical History:  ?Diagnosis Date  ? Anal fissure   ? Ataxia 06/01/2013  ? Chest pain, unspecified   ? Chronic systolic CHF (congestive heart failure) ([place]) 09/10/2014  ? Diabetes mellitus without complication ([place])   ? GERD (gastroesophageal reflux disease)   ? Glaucoma   ? pt. states no-glaucoma 07/20/13  ? Hypertension   ? Obstructive thrombus 04/23/2014  ? Orthostasis 06/01/2013  ? Other and unspecified hyperlipidemia   ? Palpitations   ? Pneumonia   ? PVC (premature ventricular contraction)   ? Rosacea   ? Shingles   ? hx  ? Shortness of breath   ? Subclinical hypothyroidism   ? Takotsubo syndrome 05/10/2014  ? ? ?Past Surgical History:  ?Procedure Laterality Date  ? APPENDECTOMY    ? bilateral eye surgery/cataract repair  05/21/2007  ? BLADDER REPAIR    ? BOWEL RESECTION    ? DILATION AND CURETTAGE OF UTERUS    ? EYE SURGERY Bilateral   ? LEFT HEART CATHETERIZATION WITH CORONARY ANGIOGRAM N/A 04/24/2014  ? Procedure: LEFT HEART CATHETERIZATION WITH CORONARY ANGIOGRAM;  Surgeon:  Wellington Hampshire, MD;  Location: [facility]ATH [facility];  Service: Cardiovascular;  Laterality: N/A;  ? LUMBAR LAMINECTOMY/ DECOMPRESSION WITH MET-RX Right 11/Medina/2022  ? Procedure: RIGHT  LUMBAR FOUR-FIVE LUMBAR LAMINECTOMY/ DECOMPRESSION WITH MET-RX;  Surgeon: Vallarie Mare, MD;  Location: [facility];  Service: Neurosurgery;  Laterality: Right;  3C  ? TOTAL ABDOMINAL HYSTERECTOMY    ? ? ? ?Current Outpatient Medications  ?Medication Sig Dispense Refill  ? acetaminophen (TYLENOL) 650 MG CR tablet Take 650-1,300 mg by mouth 2 (two) times daily as needed for pain.    ? ALPRAZolam (XANAX) 0.5 MG tablet Take 0.5 mg by mouth at bedtime.    ? atorvastatin (LIPITOR) 10 MG tablet Take 10 mg by mouth at bedtime.    ? Blood Glucose Monitoring Suppl (ONETOUCH VERIO FLEX SYSTEM) w/Device KIT 1 each by Other route. Check blood sugar every morning.    ? cyclobenzaprine (FLEXERIL) 5 MG tablet Take 5 mg by mouth every 8 (eight) hours as needed for muscle spasms.    ? desonide (DESOWEN) 0.05 % cream Apply 1 application topically 2 (two) times daily as needed (for itching/dry skin).    ? docusate sodium (COLACE) 100 MG capsule Take 1 capsule (100 mg total) by mouth 2 (two) times daily. 60 capsule 2  ? ENTRESTO 49-51 MG Take 1 tablet by mouth twice daily 180 tablet 0  ? EYSUVIS 0.25 % SUSP Place 1 drop into both eyes in the morning, at noon, in the evening, and at bedtime.    ? meclizine (ANTIVERT) 25 MG tablet Take 1 tablet (25 mg total) by mouth 3 (three) times daily as needed for dizziness. 30 tablet 0  ? metFORMIN (GLUCOPHAGE) 1000 MG tablet Take 1 tablet (1,000 mg total) by mouth 2 (two) times daily.    ? metroNIDAZOLE (METROCREAM) 0.75 % cream Apply 1 application topically daily as needed (to all itchy sites- as directed).     ? mometasone-formoterol (DULERA) 100-5 MCG/ACT AERO Inhale 2 puffs into the lungs 2 (two) times daily. 1 each 1  ? nitroGLYCERIN (NITROSTAT) 0.4 MG SL tablet DISSOLVE ONE TABLET UNDER THE TONGUE EVERY 5 MINUTES AS  NEEDED FOR CHEST PAIN. 25 tablet 4  ? ondansetron (ZOFRAN) 4 MG tablet TAKE 1 TABLET BY MOUTH EVERY 8 HOURS AS NEEDED FOR NAUSEA OR VOMITING 20 tablet 0  ? ONETOUCH VERIO test strip 1 each by Other route. Check blood suga

## 2021-10-07 ENCOUNTER — Other Ambulatory Visit: Payer: Self-pay | Admitting: Cardiovascular Disease

## 2021-11-03 ENCOUNTER — Ambulatory Visit: Payer: Medicare Other | Admitting: Cardiovascular Disease

## 2021-11-12 ENCOUNTER — Emergency Department (HOSPITAL_COMMUNITY): Payer: Medicare Other

## 2021-11-12 ENCOUNTER — Encounter (HOSPITAL_COMMUNITY): Payer: Self-pay | Admitting: Emergency Medicine

## 2021-11-12 ENCOUNTER — Other Ambulatory Visit: Payer: Self-pay

## 2021-11-12 ENCOUNTER — Emergency Department (HOSPITAL_COMMUNITY)
Admission: EM | Admit: 2021-11-12 | Discharge: 2021-11-12 | Disposition: A | Discharge status: Home / Self Care | Payer: Medicare Other | Attending: Emergency Medicine | Admitting: Emergency Medicine

## 2021-11-12 DIAGNOSIS — M25551 Pain in right hip: Secondary | ICD-10-CM | POA: Insufficient documentation

## 2021-11-12 DIAGNOSIS — I110 Hypertensive heart disease with heart failure: Secondary | ICD-10-CM | POA: Insufficient documentation

## 2021-11-12 DIAGNOSIS — I509 Heart failure, unspecified: Secondary | ICD-10-CM | POA: Insufficient documentation

## 2021-11-12 DIAGNOSIS — E119 Type 2 diabetes mellitus without complications: Secondary | ICD-10-CM | POA: Insufficient documentation

## 2021-11-12 DIAGNOSIS — E039 Hypothyroidism, unspecified: Secondary | ICD-10-CM | POA: Diagnosis not present

## 2021-11-12 MED ORDER — LIDOCAINE 5 % EX PTCH
1.0000 | MEDICATED_PATCH | CUTANEOUS | Status: DC
  Administered 2021-11-12: 1 via TRANSDERMAL
  Filled 2021-11-12: qty 1

## 2021-11-12 MED ORDER — LIDOCAINE 5 % EX OINT: 1.0000 | TOPICAL_OINTMENT | Freq: Four times a day (QID) | CUTANEOUS | 0 refills | Status: AC | PRN

## 2021-11-12 MED ORDER — ACETAMINOPHEN 500 MG PO TABS
1000.0000 mg | ORAL_TABLET | Freq: Once | ORAL | Status: AC
  Administered 2021-11-12: 1000 mg via ORAL
  Filled 2021-11-12: qty 2

## 2021-11-12 NOTE — ED Notes (Signed)
Discharge instructions were discussed with pt and son at bedside while son was wheeling pt away. Pt did not stay for discharge vs

## 2021-11-12 NOTE — ED Provider Notes (Signed)
Emergency Department Provider Note   I have reviewed the triage vital signs and the nursing notes.   HISTORY  Chief Complaint Hip Pain   HPI Kayla Medina is a 88 y.o. female with past history reviewed presents to the emergency department with her son for right hip and thigh pain after a fall 2 days ago.  She had a mechanical fall with pain in the right hip.  She has been ambulatory since with some discomfort and has had to use a rollator at home.  She takes gabapentin for discomfort along with Tylenol.  She has not tried any topical medicines. No numbness.    Past Medical History:  Diagnosis Date   Anal fissure    Ataxia 06/01/2013   Chest pain, unspecified    Chronic systolic CHF (congestive heart failure) ([place]) 09/10/2014   Diabetes mellitus without complication ([facility])    GERD (gastroesophageal reflux disease)    Glaucoma    pt. states no-glaucoma 07/20/13   Hypertension    Obstructive thrombus 04/23/2014   Orthostasis 06/01/2013   Other and unspecified hyperlipidemia    Palpitations    Pneumonia    PVC (premature ventricular contraction)    Rosacea    Shingles    hx   Shortness of breath    Subclinical hypothyroidism    Takotsubo syndrome 05/10/2014    Review of Systems  Constitutional: No fever/chills Cardiovascular: Denies chest pain. Respiratory: Denies shortness of breath. Gastrointestinal: No abdominal pain.   Genitourinary: Negative for dysuria. Musculoskeletal: Negative for back pain. Positive right hip pain.  Skin: Negative for rash. Neurological: Negative for headaches, focal weakness or numbness.   ____________________________________________   PHYSICAL EXAM:  VITAL SIGNS: ED Triage Vitals [11/12/21 0222]  Enc Vitals Group     BP 126/76     Pulse Rate 89     Resp 16     Temp 98.1 F (36.7 C)     Temp Source Oral     SpO2 97 %   Constitutional: Alert and oriented. Well appearing and in no acute distress. Eyes: Conjunctivae are  normal.  Head: Atraumatic. Nose: No congestion/rhinnorhea. Mouth/Throat: Mucous membranes are moist. Neck: No stridor.   Cardiovascular: Normal rate, regular rhythm. Good peripheral circulation. Grossly normal heart sounds.   Respiratory: Normal respiratory effort.  No retractions. Lungs CTAB. Gastrointestinal: Soft and nontender. No distention.  Musculoskeletal: Normal active range of motion of the right hip.  Patient basically here with muscle tenderness to the lateral thigh.  Normal range of motion of the left hip.  No knee or ankle tenderness. Neurologic:  Normal speech and language. No gross focal neurologic deficits are appreciated.  Skin:  Skin is warm, dry and intact. No rash noted.  ____________________________________________  RADIOLOGY  DG Hip Unilat  With Pelvis 2-3 Views Right  Result Date: 11/12/2021 CLINICAL DATA:  Fall, right hip pain EXAM: DG HIP (WITH OR WITHOUT PELVIS) 2-3V RIGHT COMPARISON:  None Available. FINDINGS: Normal alignment. No acute fracture or dislocation. Mild bilateral degenerative hip arthritis, left greater than right. Multiple phleboliths noted within the pelvis bilaterally. IMPRESSION: No acute fracture or dislocation. Electronically Signed   By: Fidela Salisbury M.D.   On: 11/12/2021 02:56    ____________________________________________   PROCEDURES  Procedure(s) performed:   Procedures  None  ____________________________________________   INITIAL IMPRESSION / ASSESSMENT AND PLAN / ED COURSE  Pertinent labs & imaging results that were available during my care of the patient were reviewed by me and considered in  my medical decision making (see chart for details).   This patient is Presenting for Evaluation of hip pain, which does require a range of treatment options, and is a complaint that involves a high risk of morbidity and mortality.  The Differential Diagnoses includes hip fracture, dislocation, MSK strain, contusion, etc  Critical  Interventions-    Medications  lidocaine (LIDODERM) 5 % 1 patch (1 patch Transdermal Patch Applied 11/12/21 0555)  acetaminophen (TYLENOL) tablet 1,000 mg (1,000 mg Oral Given 11/12/21 0555)    Reassessment after intervention:  pain improved.    I did obtain Additional Historical Information from son at bedside.   Radiologic Tests Ordered, included hip fracture. I independently interpreted the images and agree with radiology interpretation.    Medical Decision Making: Summary:  Patient presents with right hip pain after fall 2 days ago.  She has been ambulatory with a rollator but some increased discomfort.  On exam she has normal range of motion of the hip and knee.  No point/bony tenderness.  Seems to be more sore along the IT band of the right and lateral thigh.  No contusion.  Plain film without bony abnormality.  Very low suspicion for occult fracture.  Plan for Tylenol and topical lidocaine.  Have placed a referral for case management to consider referral of home health.  Face-to-face order placed in the chart as well. Patient and son comfortable with the plan at discharge.   Disposition: discharge  ____________________________________________  FINAL CLINICAL IMPRESSION(S) / ED DIAGNOSES  Final diagnoses:  Right hip pain     NEW OUTPATIENT MEDICATIONS STARTED DURING THIS VISIT:  New Prescriptions   LIDOCAINE (XYLOCAINE) 5 % OINTMENT    Apply 1 Application topically 4 (four) times daily as needed for moderate pain.    Note:  This document was prepared using Dragon voice recognition software and may include unintentional dictation errors.  Nanda Quinton, MD, [facility] Emergency Medicine    Kaisei Gilbo, [facility], MD 11/12/21 [phone]

## 2021-11-12 NOTE — Discharge Instructions (Signed)
You were seen in the emergency room today with right hip pain after a fall.  The x-ray did not show any fracture.  Continue your home pain medications and take Tylenol along with the topical pain medicine that I have called into your pharmacy.  I have also placed an order/referral for home health which includes physical and occupational therapy at home.  Please call your primary care physician to schedule a close follow-up appointment in the coming week to follow your progress and continue management as an outpatient.

## 2021-11-12 NOTE — ED Triage Notes (Signed)
Pt c/o right hip pain after a fall x 2 days ago. Denies LOC or hitting her head.

## 2021-11-29 ENCOUNTER — Emergency Department (HOSPITAL_COMMUNITY)
Admission: EM | Admit: 2021-11-29 | Discharge: 2021-11-29 | Disposition: A | Discharge status: Home / Self Care | Payer: Medicare Other | Attending: Emergency Medicine | Admitting: Emergency Medicine

## 2021-11-29 ENCOUNTER — Other Ambulatory Visit: Payer: Self-pay

## 2021-11-29 ENCOUNTER — Encounter (HOSPITAL_COMMUNITY): Payer: Self-pay

## 2021-11-29 ENCOUNTER — Emergency Department (HOSPITAL_COMMUNITY): Payer: Medicare Other

## 2021-11-29 DIAGNOSIS — I509 Heart failure, unspecified: Secondary | ICD-10-CM | POA: Insufficient documentation

## 2021-11-29 DIAGNOSIS — R079 Chest pain, unspecified: Secondary | ICD-10-CM | POA: Insufficient documentation

## 2021-11-29 DIAGNOSIS — Z7984 Long term (current) use of oral hypoglycemic drugs: Secondary | ICD-10-CM | POA: Diagnosis not present

## 2021-11-29 DIAGNOSIS — N189 Chronic kidney disease, unspecified: Secondary | ICD-10-CM | POA: Diagnosis not present

## 2021-11-29 DIAGNOSIS — E1165 Type 2 diabetes mellitus with hyperglycemia: Secondary | ICD-10-CM | POA: Diagnosis not present

## 2021-11-29 DIAGNOSIS — I130 Hypertensive heart and chronic kidney disease with heart failure and stage 1 through stage 4 chronic kidney disease, or unspecified chronic kidney disease: Secondary | ICD-10-CM | POA: Diagnosis not present

## 2021-11-29 LAB — BASIC METABOLIC PANEL
Anion gap: 11 (ref 5–15)
BUN: 20 mg/dL (ref 8–23)
CO2: 18 mmol/L — ABNORMAL LOW (ref 22–32)
Calcium: 9.4 mg/dL (ref 8.9–10.3)
Chloride: 109 mmol/L (ref 98–111)
Creatinine, Ser: 1.06 mg/dL — ABNORMAL HIGH (ref 0.44–1.00)
GFR, Estimated: 51 mL/min — ABNORMAL LOW (ref >60)
Glucose, Bld: 238 mg/dL — ABNORMAL HIGH (ref 70–99)
Potassium: 4.1 mmol/L (ref 3.5–5.1)
Sodium: 138 mmol/L (ref 135–145)

## 2021-11-29 LAB — CBC
HCT: 37.1 % (ref 36.0–46.0)
Hemoglobin: 12.3 g/dL (ref 12.0–15.0)
MCH: 31.7 pg (ref 26.0–34.0)
MCHC: 33.2 g/dL (ref 30.0–36.0)
MCV: 95.6 fL (ref 80.0–100.0)
Platelets: 240 10*3/uL (ref 150–400)
RBC: 3.88 MIL/uL (ref 3.87–5.11)
RDW: 14.4 % (ref 11.5–15.5)
WBC: 7.2 10*3/uL (ref 4.0–10.5)
nRBC: 0 % (ref 0.0–0.2)

## 2021-11-29 LAB — TROPONIN I (HIGH SENSITIVITY)
Troponin I (High Sensitivity): 11 ng/L (ref <18)
Troponin I (High Sensitivity): 13 ng/L (ref <18)

## 2021-11-29 MED ORDER — FAMOTIDINE 20 MG PO TABS
20.0000 mg | ORAL_TABLET | Freq: Once | ORAL | Status: AC
  Administered 2021-11-29: 20 mg via ORAL
  Filled 2021-11-29: qty 1

## 2021-11-29 NOTE — ED Triage Notes (Signed)
Patient complains of chest pain intermittently since last night and took her own nitro throughout the night. This am recurrent pain chest pain and received zofran and baby asa pta. On arrival alert and oriented, pale on arrival. Complains of back pain only at present.

## 2021-11-29 NOTE — ED Provider Triage Note (Signed)
Emergency Medicine Provider Triage Evaluation Note  Kayla Medina , a 88 y.o. female  was evaluated in triage.  Pt complains of chest pain. Onset was last night and lasted until this morning. It was left sided. She ended up taking Nitroglycerin this morning around 8 am and took 5 doses by 11 am until the pain went away. She is now chest pain free.  She states that she normally has chest pain like this but it does not normally last so long. She denies any dizziness, nausea, vomiting, shortness of breath, numbness, or weakness  Review of Systems  Positive:  Negative:   Physical Exam  BP (!) 158/75 (BP Location: Left Arm)   Pulse 84   Temp 98.5 F (36.9 C) (Oral)   Resp 18   SpO2 96%  Gen:   Awake, no distress   Resp:  Normal effort  MSK:   Moves extremities without difficulty  Other:    Medical Decision Making  Medically screening exam initiated at 12:48 PM.  Appropriate orders placed.  PRIYANKA CAUSEY was informed that the remainder of the evaluation will be completed by another provider, this initial triage assessment does not replace that evaluation, and the importance of remaining in the ED until their evaluation is complete.     Adolphus Birchwood, PA-C 11/29/21 1249

## 2021-11-29 NOTE — ED Provider Notes (Signed)
[facility] Provider Note   CSN: [id number] Arrival date & time: 11/29/21  1229     History  No chief complaint on file.   Kayla Medina is a 88 y.o. female.  HPI Patient is an 88 year old female with past medical history significant for DM 2, HLD, palpitations, chest pain, Takotsubo with improving CHF, CKD, hyponatremia  She is presented emergency room today with complaints of chest pain that resolved after she took several doses of nitroglycerin this morning.  She states that it was approximately 8 AM when she had the episode of chest pain.  She states that she took 5 individual doses of nitroglycerin and the pain went away completely.  She has not had any chest pain since.  She has not had any dyspnea, nausea, vomiting lightheadedness or dizziness.  She states that she has episodes of chest pain not infrequently.  However she states that typically the chest pain resolves more quickly.  She denies any symptoms currently.    Home Medications Prior to Admission medications   Medication Sig Start Date End Date Taking? Authorizing Provider  acetaminophen (TYLENOL) 650 MG CR tablet Take 650-1,300 mg by mouth 2 (two) times daily as needed for pain.    [provider]  ALPRAZolam Duanne Moron) 0.5 MG tablet Take 0.5 mg by mouth at bedtime.    [provider]  atorvastatin (LIPITOR) 10 MG tablet Take 10 mg by mouth at bedtime.    [provider]  benzonatate (TESSALON) 100 MG capsule Take 1 capsule (100 mg total) by mouth 3 (three) times daily as needed for cough. Patient not taking: Reported on 07/20/2021 11/23/20   Bonnielee Haff, MD  Blood Glucose Monitoring Suppl ([place]) w/Device KIT 1 each by Other route. Check blood sugar every morning. 02/12/19   [provider]  cyclobenzaprine (FLEXERIL) 5 MG tablet Take 5 mg by mouth every 8 (eight) hours as needed for muscle spasms. 07/21/20   [provider]  desonide (DESOWEN) 0.05 % cream Apply 1 application topically 2 (two) times daily as needed (for itching/dry skin).    [provider]  docusate sodium (COLACE) 100 MG capsule Take 1 capsule (100 mg total) by mouth 2 (two) times daily. 02/05/21 02/05/22  Vallarie Mare, MD  EYSUVIS 0.25 % SUSP Place 1 drop into both eyes in the morning, at noon, in the evening, and at bedtime. 01/09/20   [provider]  HYDROcodone-acetaminophen (NORCO/VICODIN) 5-325 MG tablet Take 1 tablet by mouth every 6 (six) hours as needed. Patient not taking: Reported on 07/20/2021 02/05/21   Vallarie Mare, MD  ipratropium-albuterol (DUONEB) 0.5-2.5 (3) MG/3ML SOLN Take 3 mLs by nebulization every 6 (six) hours as needed. Patient not taking: Reported on 02/03/2021 11/23/20 01/22/21  Bonnielee Haff, MD  levothyroxine (SYNTHROID) 75 MCG tablet Take 0.5 tablets (37.5 mcg total) by mouth daily before breakfast. Patient taking differently: Take 50 mcg by mouth daily before breakfast. 11/24/20 02/05/21  Bonnielee Haff, MD  lidocaine (XYLOCAINE) 5 % ointment Apply 1 Application topically 4 (four) times daily as needed for moderate pain. 11/12/21   Long, Wonda Olds, MD  meclizine (ANTIVERT) 25 MG tablet Take 1 tablet (25 mg total) by mouth 3 (three) times daily as needed for dizziness. 06/03/13   Shon Baton, MD  metFORMIN (GLUCOPHAGE) 1000 MG tablet Take 1 tablet (1,000 mg total) by mouth 2 (two) times daily. 04/26/14   Elgergawy, Silver Huguenin, MD  metroNIDAZOLE (METROCREAM)  0.75 % cream Apply 1 application topically daily as needed (to all itchy sites- as directed).  01/11/18   [provider]  mometasone-formoterol (DULERA) 100-5 MCG/ACT AERO Inhale 2 puffs into the lungs 2 (two) times daily. 11/23/20   Bonnielee Haff, MD  nitroGLYCERIN (NITROSTAT) 0.4 MG SL tablet DISSOLVE ONE TABLET UNDER THE TONGUE EVERY 5 MINUTES AS NEEDED FOR CHEST PAIN. 06/26/21   Nahser, Wonda Cheng, MD  ondansetron (ZOFRAN) 4 MG tablet  TAKE 1 TABLET BY MOUTH EVERY 8 HOURS AS NEEDED FOR NAUSEA OR VOMITING 03/08/21   Nahser, Wonda Cheng, MD  [facility] VERIO test strip 1 each by Other route. Check blood sugar every morning 01/08/18   [provider]  pantoprazole (PROTONIX) 40 MG tablet 1 Tablet(s) By Mouth Morning-Evening 06/09/21   [provider]  Polyethyl Glycol-Propyl Glycol (SYSTANE) 0.4-0.3 % GEL ophthalmic gel Place 1 application into both eyes at bedtime.    [provider]  pregabalin (LYRICA) 100 MG capsule Take 100 mg by mouth 3 (three) times daily. 07/15/21   [provider]  sacubitril-valsartan (ENTRESTO) 49-51 MG Take 1 tablet by mouth twice daily 10/07/21   Nahser, Wonda Cheng, MD  SYNTHROID 50 MCG tablet Take 50 mcg by mouth daily. 05/06/21   [provider]  traMADol (ULTRAM) 50 MG tablet Take 50 mg by mouth every 6 (six) hours as needed. Patient not taking: Reported on 07/20/2021 12/31/20   [provider]      Allergies    Aspirin, Barbiturates, Codeine, Januvia [sitagliptin], Meperidine hcl, Other, Penicillins, Ramipril, and Sulfonamide derivatives    Review of Systems   Review of Systems  Physical Exam Updated Vital Signs BP (!) 151/96   Pulse 77   Temp 98.7 F (37.1 C) (Oral)   Resp 18   SpO2 94%  Physical Exam Vitals and nursing note reviewed.  Constitutional:      General: She is not in acute distress.    Comments: Pleasant well-appearing 88 year old.  In no acute distress.  Sitting comfortably in bed.  Able answer questions appropriately follow commands. No increased work of breathing. Speaking in full sentences.   HENT:     Head: Normocephalic and atraumatic.     Nose: Nose normal.     Mouth/Throat:     Mouth: Mucous membranes are moist.  Eyes:     General: No scleral icterus. Cardiovascular:     Rate and Rhythm: Normal rate and regular rhythm.     Pulses: Normal pulses.     Heart sounds: Normal heart sounds.  Pulmonary:     Effort:  Pulmonary effort is normal. No respiratory distress.     Breath sounds: Normal breath sounds. No wheezing.  Abdominal:     Palpations: Abdomen is soft.     Tenderness: There is no abdominal tenderness. There is no guarding or rebound.  Musculoskeletal:     Cervical back: Normal range of motion.     Right lower leg: No edema.     Left lower leg: No edema.  Skin:    General: Skin is warm and dry.     Capillary Refill: Capillary refill takes less than 2 seconds.  Neurological:     Mental Status: She is alert. Mental status is at baseline.  Psychiatric:        Mood and Affect: Mood normal.        Behavior: Behavior normal.     ED Results / Procedures / Treatments   Labs (all labs ordered are  listed, but only abnormal results are displayed) Labs Reviewed  BASIC METABOLIC PANEL - Abnormal; Notable for the following components:      Result Value   CO2 18 (*)    Glucose, Bld 238 (*)    Creatinine, Ser 1.06 (*)    GFR, Estimated 51 (*)    All other components within normal limits  CBC  TROPONIN I (HIGH SENSITIVITY)  TROPONIN I (HIGH SENSITIVITY)    EKG EKG Interpretation  Date/Time:  Sunday November 29 2021 12:40:10 EDT Ventricular Rate:  95 PR Interval:  166 QRS Duration: 124 QT Interval:  400 QTC Calculation: 502 R Axis:   24 Text Interpretation: Sinus rhythm with Premature atrial complexes Left bundle branch block Abnormal ECG No previous ECGs available similar to prior Confirmed by Wynona Dove ([id number]) on 11/29/2021 3:11:29 PM  Radiology DG Chest 1 View  Result Date: 11/29/2021 CLINICAL DATA:  Left chest pain EXAM: CHEST  1 VIEW COMPARISON:  Previous studies including the examination of 11/21/2020 FINDINGS: Cardiac size is within normal limits. Low position of diaphragms suggests possible COPD. There are no signs of pulmonary edema or focal pulmonary consolidation. Degenerative changes are noted in the [facility] joints, more so on the right side. IMPRESSION: There are no focal  infiltrates or signs of pulmonary edema. Electronically Signed   By: Elmer Picker M.D.   On: 11/29/2021 13:28    Procedures Procedures    Medications Ordered in ED Medications  famotidine (PEPCID) tablet 20 mg (20 mg Oral Given 11/29/21 1603)    ED Course/ Medical Decision Making/ A&P                           Medical Decision Making   This patient presents to the ED for concern of CP now resolved, this involves a number of treatment options, and is a complaint that carries with it a high risk of complications and morbidity.  The differential diagnosis includes below  The emergent causes of chest pain include: Acute coronary syndrome, tamponade, pericarditis/myocarditis, aortic dissection, pulmonary embolism, tension pneumothorax, pneumonia, and esophageal rupture.  Other urgent/non-acute considerations include, but are not limited to: chronic angina, aortic stenosis, cardiomyopathy, mitral valve prolapse, pulmonary hypertension, aortic insufficiency, right ventricular hypertrophy, pleuritis, bronchitis, pneumothorax, tumor, gastroesophageal reflux disease (GERD), esophageal spasm, Mallory-Weiss syndrome, peptic ulcer disease, pancreatitis, functional gastrointestinal pain, cervical or thoracic disk disease or arthritis, shoulder arthritis, costochondritis, subacromial bursitis, anxiety or panic attack, herpes zoster, breast disorders, chest wall tumors, thoracic outlet syndrome, mediastinitis.    Co morbidities: Discussed in HPI   Brief History:  Patient is an 88 year old female with past medical history significant for DM 2, HLD, palpitations, chest pain, Takotsubo with improving CHF, CKD, hyponatremia  She is presented emergency room today with complaints of chest pain that resolved after she took several doses of nitroglycerin this morning.  She states that it was approximately 8 AM when she had the episode of chest pain.  She states that she took 5 individual doses of  nitroglycerin and the pain went away completely.  She has not had any chest pain since.  She has not had any dyspnea, nausea, vomiting lightheadedness or dizziness.  She states that she has episodes of chest pain not infrequently.  However she states that typically the chest pain resolves more quickly.  She denies any symptoms currently.    EMR reviewed including pt PMHx, past surgical history and past visits to ER.   See  HPI for more details   Lab Tests:   I ordered and independently interpreted labs. Labs notable for flat, not elevated troponins consistent with prior.  CBC unremarkable, BMP notable for hyperglycemia consistent with history of diabetes   Imaging Studies:  NAD. I personally reviewed all imaging studies and no acute abnormality found. I agree with radiology interpretation.    Cardiac Monitoring:  The patient was maintained on a cardiac monitor.  I personally viewed and interpreted the cardiac monitored which showed an underlying rhythm of: NSR EKG non-ischemic   Medicines ordered:  I ordered medication including Pepcid for some dyspepsia Reevaluation of the patient after these medicines showed that the patient stayed the same currently no symptoms I have reviewed the patients home medicines and have made adjustments as needed   Critical Interventions:     Consults/Attending Physician      Reevaluation:  After the interventions noted above I re-evaluated patient and found that they have :stayed the same no pain I discussed this case with my attending physician who cosigned this note including patient's presenting symptoms, physical exam, and planned diagnostics and interventions. Attending physician stated agreement with plan or made changes to plan which were implemented.    Social Determinants of Health:      Problem List / ED Course:  Chest pain this morning with complete resolution of symptoms. She is requesting discharge home.  Work-up  reassuring here.  History of angina.  Recommend strict return precautions.  Dispostion:  After consideration of the diagnostic results and the patients response to treatment, I feel that the patent would benefit from follow-up with cardiology    Final Clinical Impression(s) / ED Diagnoses Final diagnoses:  Chest pain, unspecified type    Rx / DC Orders ED Discharge Orders     None         Tedd Sias, [place] 11/29/21 1820    Jeanell Sparrow, DO 11/30/21 0014

## 2021-11-29 NOTE — Discharge Instructions (Signed)
Please follow-up with your cardiologist.  Return to the emergency room for any new or concerning symptoms.  Make sure you take all your medications as prescribed.  Do not take more than 3 doses of nitroglycerin spaced 5 minutes apart each.

## 2021-12-15 ENCOUNTER — Other Ambulatory Visit (HOSPITAL_COMMUNITY): Payer: Self-pay | Admitting: Neurosurgery

## 2021-12-15 DIAGNOSIS — M5416 Radiculopathy, lumbar region: Secondary | ICD-10-CM

## 2022-01-02 ENCOUNTER — Ambulatory Visit (HOSPITAL_COMMUNITY)
Admission: RE | Admit: 2022-01-02 | Discharge: 2022-01-02 | Disposition: A | Discharge status: Home / Self Care | Payer: Medicare Other | Source: Ambulatory Visit | Attending: Neurosurgery | Admitting: Neurosurgery

## 2022-01-02 DIAGNOSIS — M5416 Radiculopathy, lumbar region: Secondary | ICD-10-CM | POA: Diagnosis present

## 2022-01-02 MED ORDER — GADOBUTROL 1 MMOL/ML IV SOLN
7.0000 mL | Freq: Once | INTRAVENOUS | Status: AC | PRN
Start: 2022-01-02 — End: 2022-01-02
  Administered 2022-01-02: 7 mL via INTRAVENOUS

## 2022-02-16 ENCOUNTER — Encounter: Payer: Self-pay | Admitting: Internal Medicine

## 2022-07-08 ENCOUNTER — Other Ambulatory Visit: Payer: Self-pay | Admitting: Cardiovascular Disease

## 2022-10-25 ENCOUNTER — Telehealth: Payer: Self-pay | Admitting: Cardiovascular Disease

## 2022-10-25 MED ORDER — ENTRESTO 49-51 MG PO TABS: 1.0000 | ORAL_TABLET | Freq: Two times a day (BID) | ORAL | 1 refills | Status: DC

## 2022-10-25 NOTE — Telephone Encounter (Signed)
Pt's medication was sent to pt's pharmacy as requested. Confirmation received.  °

## 2022-10-25 NOTE — Telephone Encounter (Signed)
*  STAT* If patient is at the pharmacy, call can be transferred to refill team.   1. Which medications need to be refilled? (please list name of each medication and dose if known)  Entresto   2. Would you like to learn more about the convenience, safety, & potential cost savings by using the [facility]?     3. Are you open to using the [facility] (Type [facility]. .   4. Which pharmacy/location (including street and city if local pharmacy) is medication to be sent to? [facility] [address], [place]   5. Do they need a 30 day or 90 day supply? Enough until her appointment on [birth year]27[birth year]

## 2023-01-28 ENCOUNTER — Encounter: Payer: Self-pay | Admitting: Nurse Practitioner

## 2023-01-28 ENCOUNTER — Ambulatory Visit: Payer: Medicare Other | Attending: Nurse Practitioner | Admitting: Nurse Practitioner

## 2023-01-28 ENCOUNTER — Other Ambulatory Visit: Payer: Self-pay | Admitting: *Deleted

## 2023-01-28 VITALS — BP 118/70 | HR 71 | Resp 16 | Ht 66.0 in | Wt 131.4 lb

## 2023-01-28 DIAGNOSIS — I5022 Chronic systolic (congestive) heart failure: Secondary | ICD-10-CM

## 2023-01-28 DIAGNOSIS — R634 Abnormal weight loss: Secondary | ICD-10-CM

## 2023-01-28 DIAGNOSIS — I10 Essential (primary) hypertension: Secondary | ICD-10-CM

## 2023-01-28 DIAGNOSIS — N183 Chronic kidney disease, stage 3 unspecified: Secondary | ICD-10-CM | POA: Diagnosis not present

## 2023-01-28 DIAGNOSIS — I491 Atrial premature depolarization: Secondary | ICD-10-CM

## 2023-01-28 DIAGNOSIS — I5181 Takotsubo syndrome: Secondary | ICD-10-CM

## 2023-01-28 NOTE — Patient Instructions (Signed)
Medication Instructions:   Your physician recommends that you continue on your current medications as directed. Please refer to the Current Medication list given to you today.   *If you need a refill on your cardiac medications before your next appointment, please call your pharmacy*   Lab Work:  TODAY!!!! BMET  If you have labs (blood work) drawn today and your tests are completely normal, you will receive your results only by: MyChart Message (if you have MyChart) OR A paper copy in the mail If you have any lab test that is abnormal or we need to change your treatment, we will call you to review the results.   Testing/Procedures:  None ordered.   Follow-Up: At [facility], you and your health needs are our priority.  As part of our continuing mission to provide you with exceptional heart care, we have created designated Provider Care Teams.  These Care Teams include your primary Cardiologist (physician) and Advanced Practice Providers (APPs -  Physician Assistants and Nurse Practitioners) who all work together to provide you with the care you need, when you need it.  We recommend signing up for the patient portal called "MyChart".  Sign up information is provided on this After Visit Summary.  MyChart is used to connect with patients for Virtual Visits (Telemedicine).  Patients are able to view lab/test results, encounter notes, upcoming appointments, etc.  Non-urgent messages can be sent to your provider as well.   To learn more about what you can do with MyChart, go to [email or web address].    Your next appointment:   1 year(s)  Provider:   Kristeen Miss, MD  or Lebron Conners   Other Instructions  Your physician wants you to follow-up in: 1 year.  You will receive a reminder letter in the mail two months in advance. If you don't receive a letter, please call our office to schedule the follow-up appointment.

## 2023-01-28 NOTE — Progress Notes (Signed)
Cardiology Office Note:  .   Date:  01/28/2023  ID:  TEAGYN GIERKE, DOB 23-Aug-1932, MRN [id number] PCP: Cleatis Polka., MD  [facility] Providers Cardiologist:  Kristeen Miss, MD    Patient Profile: .      PMH Takotsubo cardiomyopathy Chronic HFrEF Hypertension LBBB Type 2 DM  Established care with cardiology in 2016 following admission for mild MI thought to be secondary to Takotsubo syndrome with normal coronary arteries on cath.  Found to have EF 25 to 30% on follow-up echo 08/2014. She had improvement of LVEF 01/2016 to 40-45%. She has maintained consistent follow-up. She underwent back surgery in 01/2021 with no significant complications.   Most recent echocardiogram 10/2020 shows mildly reduced LVEF 50 to 55%, mild LVH, indeterminate diastolic parameters, normal RV, no significant valve disease.   Last cardiology clinic visit was 07/20/2021 with Dr. Elease Hashimoto. She had painful fibrotic area along the medial aspect of her left breast with no erythema but very tender.  She had no significant cardiac symptoms and was advised to return in 6 months for follow-up.       History of Present Illness: .   Kayla Medina is a very pleasant 89 y.o. female who is here today for overdue follow-up of chronic HFrEF. She is accompanied by her caregiver. Reports she has had more problems since she suffered a fall in 2022. Has subsequent ankle swelling and chronic back pain despite having back surgery for crushed vertebrae. She has chronic shortness of breath that she feels is stable. Sleeps on her right side due to discomfort when lying on the left side, but otherwise reports sleeping well. Lost approximately 30 pounds and has a decrease in appetite. She was prescribed medication to stimulate appetite, but stopped taking it due to drowsiness. Occasional extra heartbeats, particularly before sleep. Reports hearing strange music when taking Lyrica for pain. She denies chest pain, orthopnea, PND,  presyncope or syncope. She is still very sharp, calls herself "mouthy."   Discussed the use of AI scribe software for clinical note transcription with the patient, who gave verbal consent to proceed.   ROS: See HPI       Studies Reviewed: Marland Kitchen   EKG Interpretation Date/Time:  Friday January 28 2023 16:02:06 EST Ventricular Rate:  80 PR Interval:  170 QRS Duration:  134 QT Interval:  448 QTC Calculation: 516 R Axis:   2  Text Interpretation: Sinus rhythm with Premature atrial complexes with Abberant conduction Left bundle branch block When compared with ECG of 29-Nov-2021 12:40, No significant change was found Confirmed by Eligha Bridegroom [phone]) on 01/28/2023 4:15:48 PM     Risk Assessment/Calculations:             Physical Exam:   VS:  BP 118/70 (BP Location: Left Arm, Patient Position: Sitting, Cuff Size: Normal)   Pulse 71   Resp 16   Ht 5\' 6"  (1.676 m)   Wt 131 lb 6.4 oz (59.6 kg)   SpO2 97%   BMI 21.21 kg/m    Wt Readings from Last 3 Encounters:  01/28/23 131 lb 6.4 oz (59.6 kg)  07/20/21 145 lb 12.8 oz (66.1 kg)  02/05/21 142 lb (64.4 kg)    GEN: Frail elderly in no acute distress NECK: No JVD; No carotid bruits CARDIAC: RRR, no murmurs, rubs, gallops RESPIRATORY:  Clear to auscultation without rales, wheezing or rhonchi  ABDOMEN: Soft, non-tender, non-distended EXTREMITIES:  LLE edema; No deformity     ASSESSMENT AND  PLAN: .    Chronic HFrEF: LVEF 50 to 55%, indeterminate diastolic parameters, mild LVH, normal RV on most recent echo 10/2020.  She reports chronic shortness of breath that she feels is stable.  Has LLE edema that she also feels is stable. No dyspnea, orthopnea, PND. Volume status appears stable. She has lost 14 lbs in 2 years, unintentional. Not as active as she was previously. Continues to work on increasing caloric intake.  She does not like to take medications but is compliant with Entresto.  We will check BMP today.  CKD: Reports PCP reported  decline in kidney function at last office visit.  I do not have the lab work to review.  We will recheck BMP today to monitor renal function on Entresto.   PACs: EKG today reveals sinus rhythm with PACs at 80 bpm.  She has some palpitations when lying down but these are not particularly bothersome. She prefers not to take beta blocker.   Weight loss: Unintentional weight loss over the past 2 years since she suffered a fall. Tried appetite stimulant but says it made her tired.  Encouraged her to continue efforts to find foods that the pill to her.       Dispo: 1 year with Dr. Elease Hashimoto or me  Signed, Eligha Bridegroom, NP-C

## 2023-01-29 LAB — BASIC METABOLIC PANEL
BUN/Creatinine Ratio: 21 (ref 12–28)
BUN: 25 mg/dL (ref 8–27)
CO2: 21 mmol/L (ref 20–29)
Calcium: 9.7 mg/dL (ref 8.7–10.3)
Chloride: 105 mmol/L (ref 96–106)
Creatinine, Ser: 1.19 mg/dL — ABNORMAL HIGH (ref 0.57–1.00)
Glucose: 159 mg/dL — ABNORMAL HIGH (ref 70–99)
Potassium: 4.1 mmol/L (ref 3.5–5.2)
Sodium: 141 mmol/L (ref 134–144)
eGFR: 44 mL/min/{1.73_m2} — ABNORMAL LOW (ref >59)

## 2023-03-04 IMAGING — US US ABDOMEN LIMITED
1 series · 14 of 25 positions shown · non-contrast
Comparison: No recent prior.

CLINICAL DATA: Elevated LFTs.

EXAM:
ULTRASOUND ABDOMEN LIMITED RIGHT UPPER QUADRANT

[Series 1: us abdomen limited · 0.19mm/px · 14 of 48 slices shown]
[im 1/48]
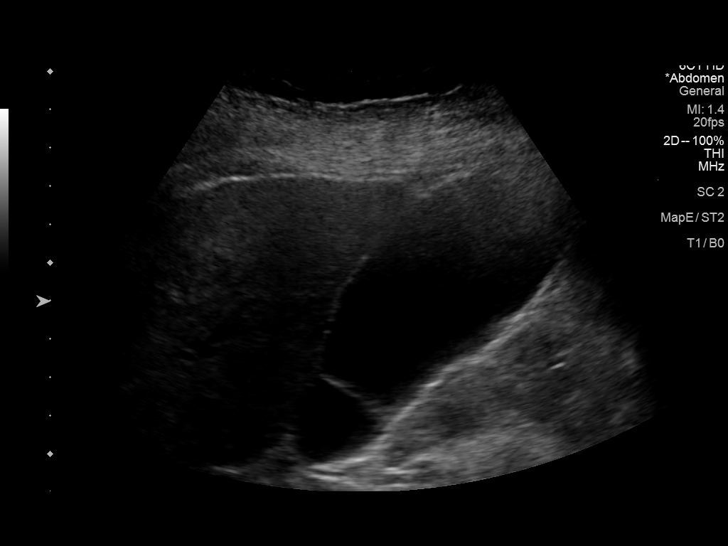
[im 4/48]
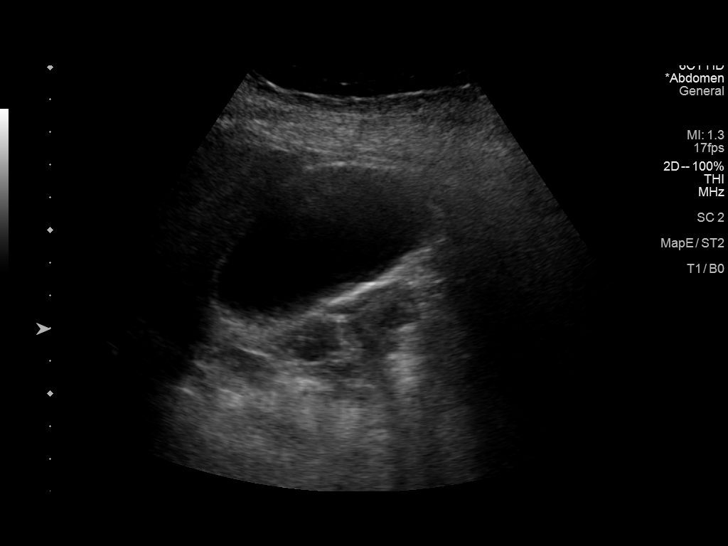
[im 8/48]
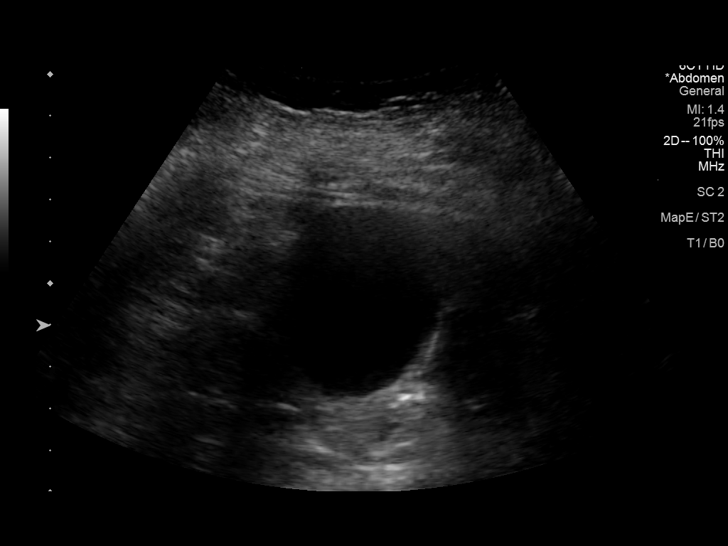
[im 12/48]
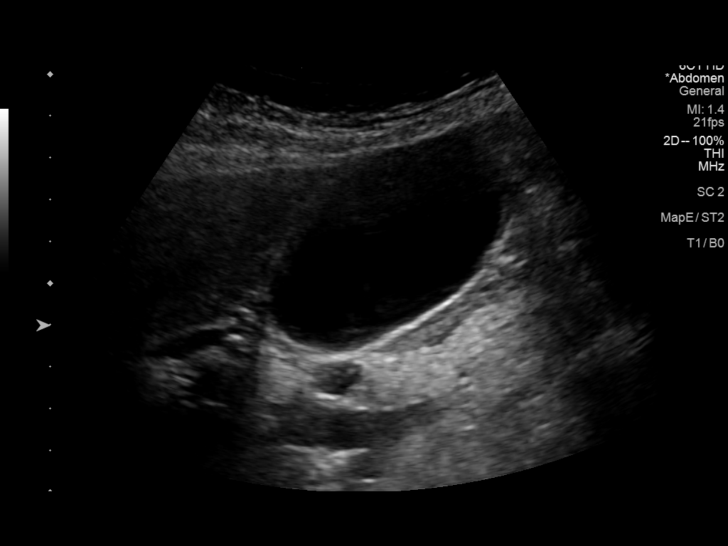
[im 16/48]
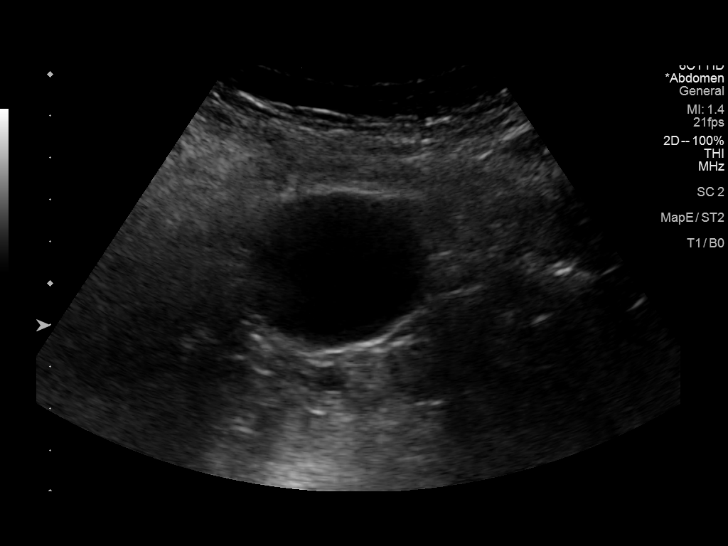
[im 18/48]
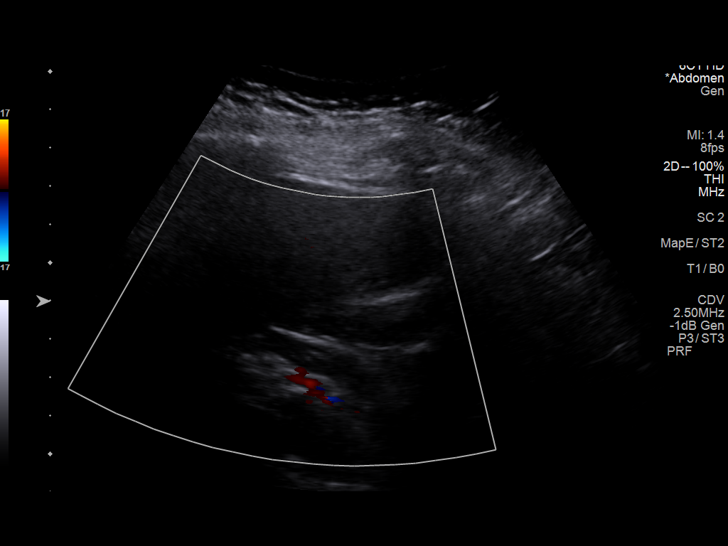
[im 22/48]
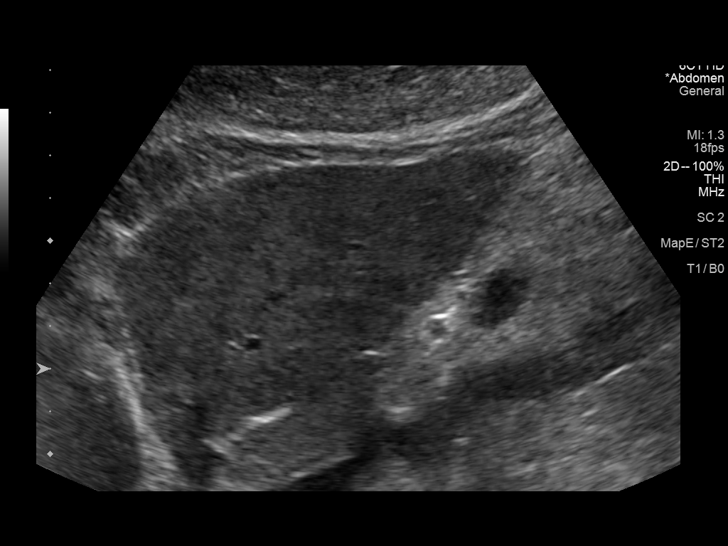
[im 26/48]
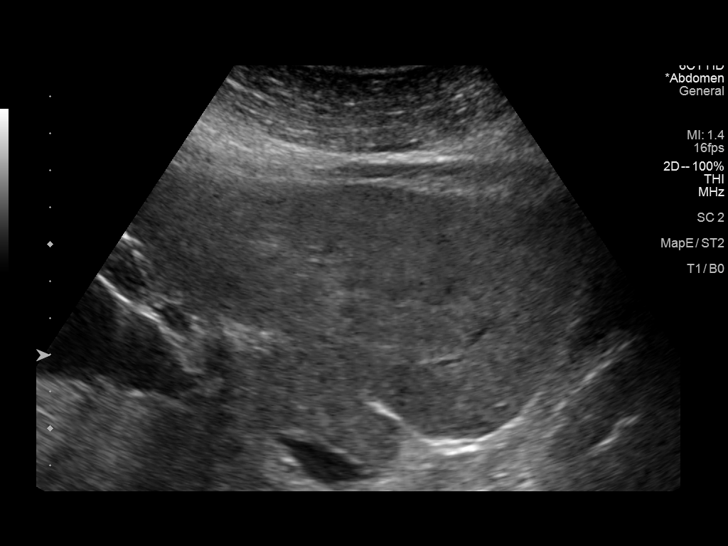
[im 30/48]
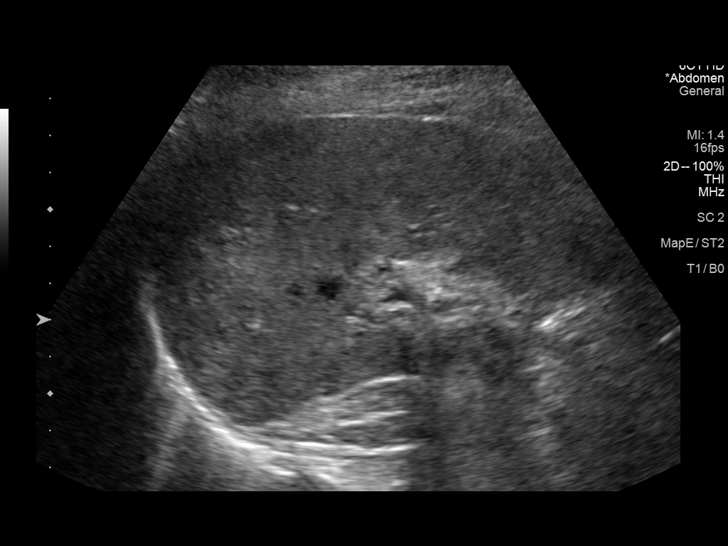
[im 32/48]
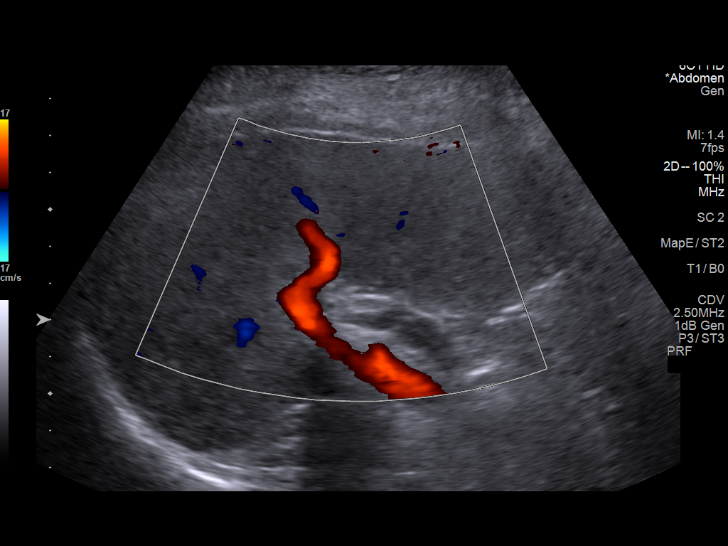
[im 36/48]
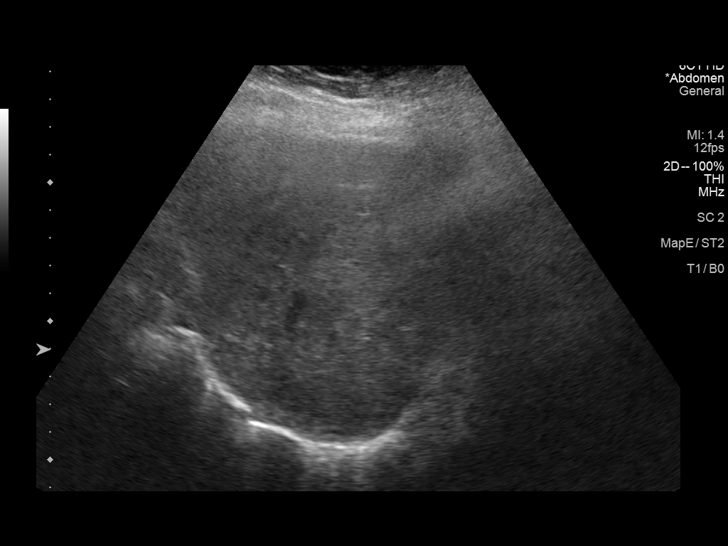
[im 40/48]
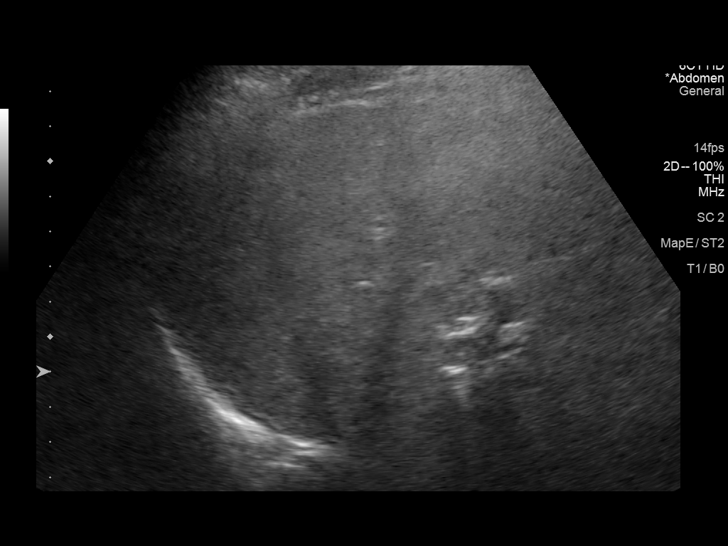
[im 44/48]
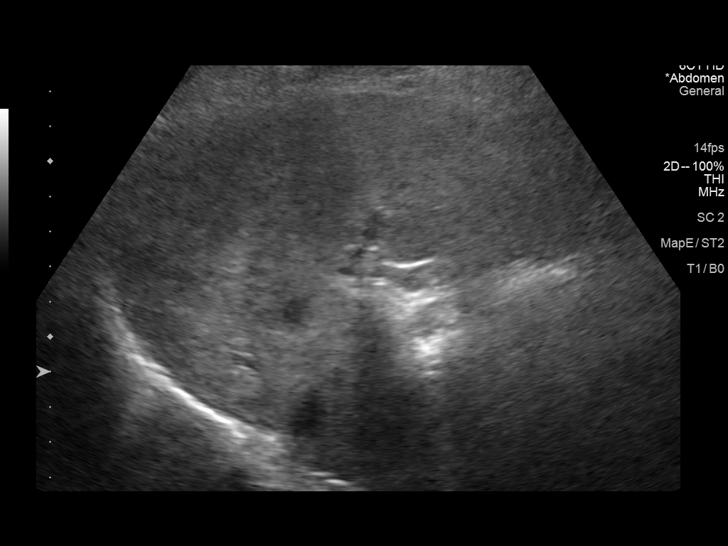
[im 48/48]
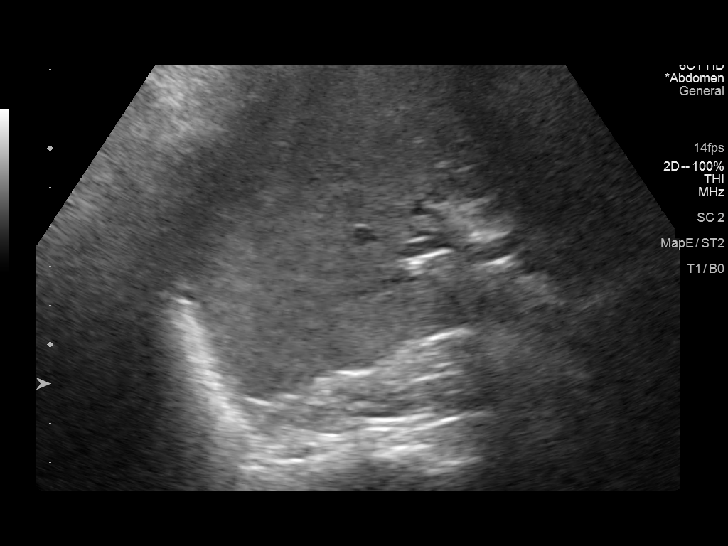

[14 of 25 positions shown; findings below may reference images not displayed]

FINDINGS: Gallbladder:

Gallbladder slightly distended. No gallstones or wall thickening
visualized. No sonographic Murphy sign noted by sonographer.

Common bile duct:

Diameter: 7.3 mm

Liver:

Increased hepatic echogenicity consistent fatty infiltration and or
hepatocellular disease. No focal hepatic abnormality identified.
Portal vein is patent on color Doppler imaging with normal direction
of blood flow towards the liver.

Other: None.
IMPRESSION: 1. Gallbladder slightly distended. No gallstones. Common bile duct
is slightly prominent at 7.3 mm. Further evaluation of the
gallbladder and biliary system can be obtained with MRI of the
abdomen/MRCP.

2. Increased hepatic echogenicity consistent with fatty infiltration
and or hepatocellular disease.

## 2023-04-18 ENCOUNTER — Other Ambulatory Visit: Payer: Self-pay | Admitting: Cardiovascular Disease

## 2023-04-29 IMAGING — DX DG CHEST 2V
2 series · 2 of 2 positions shown · non-contrast
Comparison: None.

CLINICAL DATA: Chest x-ray dated February 06, 2018

EXAM:
CHEST - 2 VIEW

[chest lat]
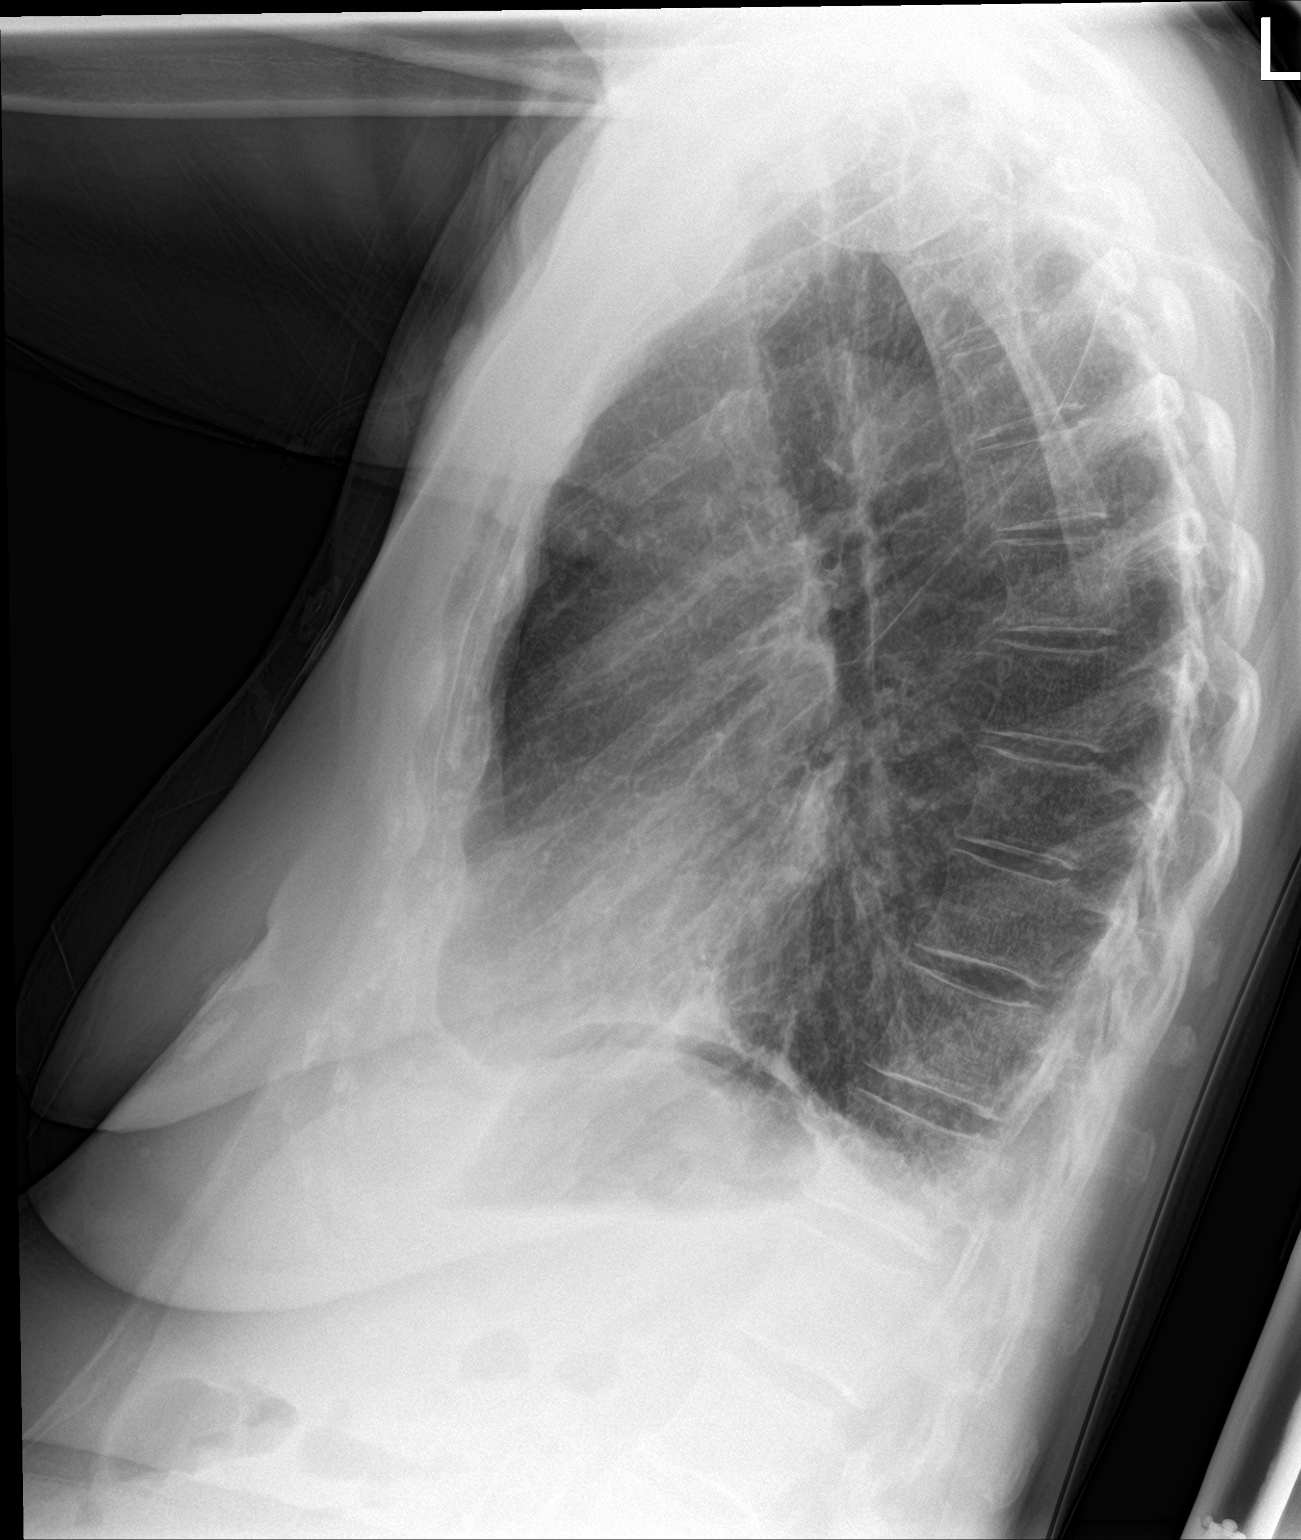

[chest ap]
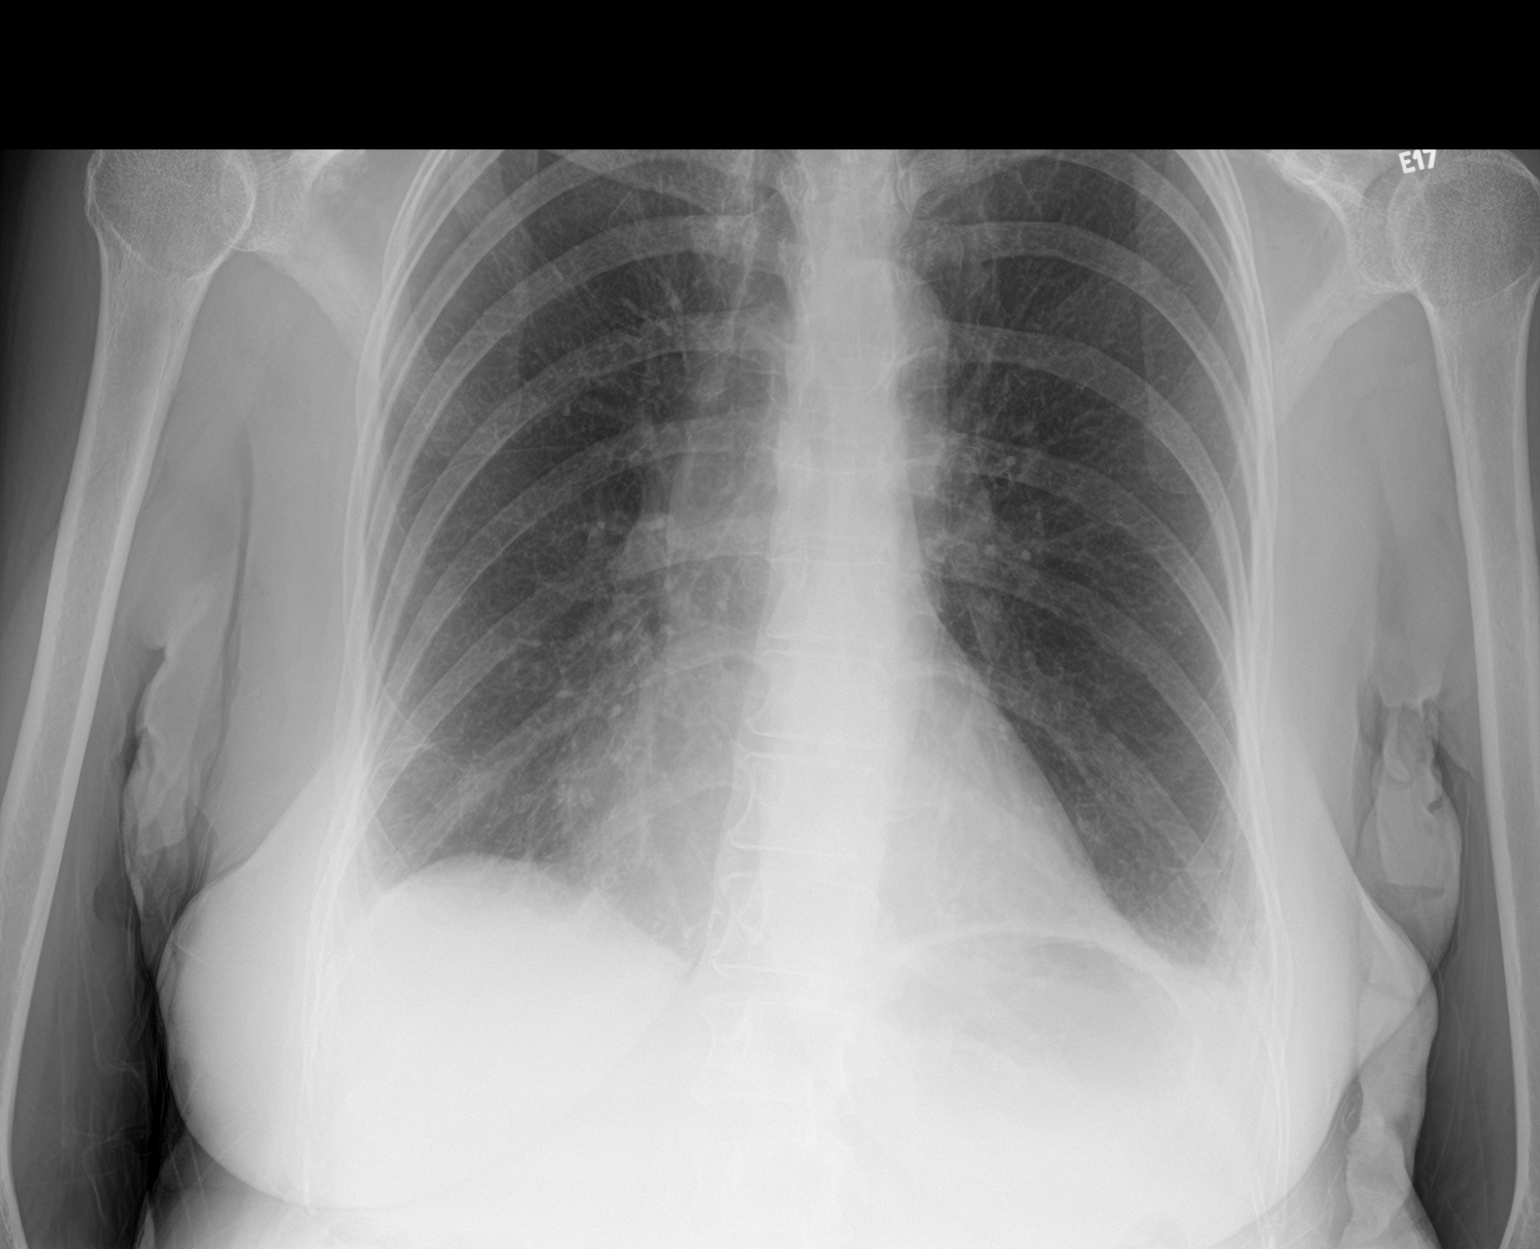

[2 of 2 positions shown; findings below may reference images not displayed]

FINDINGS: Cardiac and mediastinal contours are within normal limits. Trace
bilateral pleural effusions and bibasilar atelectasis.More focal
nodular opacity is seen adjacent to the left diaphragm on lateral
view. No evidence of pneumothorax.
IMPRESSION: New trace bilateral pleural effusions and atelectasis. More focal
nodular opacity is seen adjacent to the left diaphragm on lateral
view, possibly an additional area of atelectasis. Recommend
follow-up PA and lateral chest x-ray 4-6 weeks to ensure resolution.

## 2023-04-29 IMAGING — CT CT ANGIO CHEST
2 of 7 series · 19 of 46 positions shown · IV contrast (APPLIED)
Comparison: None.

CLINICAL DATA: PE suspected, high prob

EXAM:
CT ANGIOGRAPHY CHEST WITH CONTRAST
TECHNIQUE: Multidetector CT imaging of the chest was performed using the
standard protocol during bolus administration of intravenous
contrast. Multiplanar CT image reconstructions and MIPs were
obtained to evaluate the vascular anatomy.
CONTRAST:  60mL OMNIPAQUE IOHEXOL 350 MG/ML SOLN

[Series 7: thins · axial · 0.75mm/px · z∈[-261,+7]mm · 16 of 431 slices shown]
[im 24/431  lung]
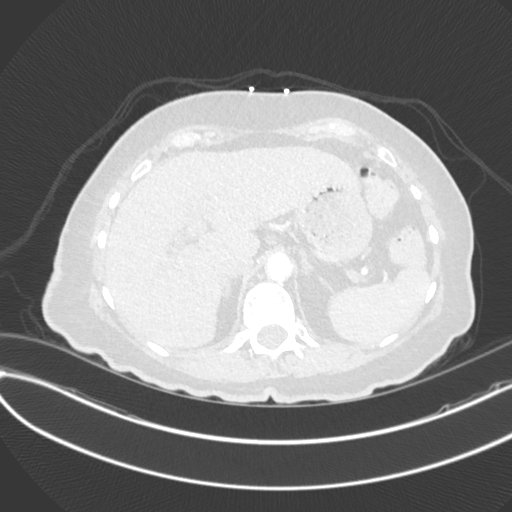
[im 48/431  soft-tissue]
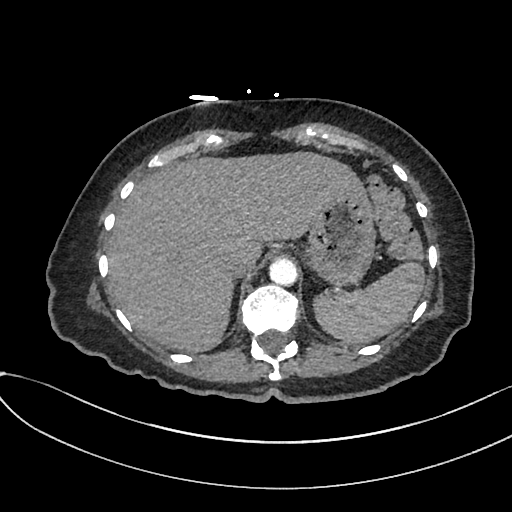
[im 72/431  lung]
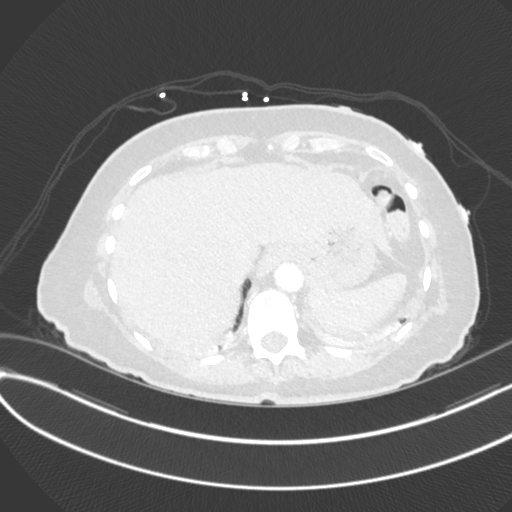
[im 96/431  soft-tissue]
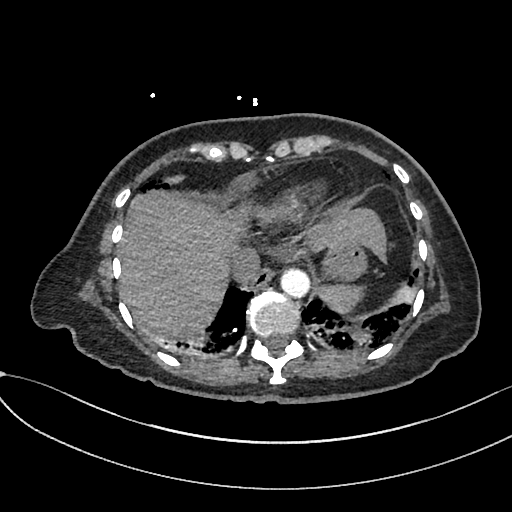
[im 120/431  lung]
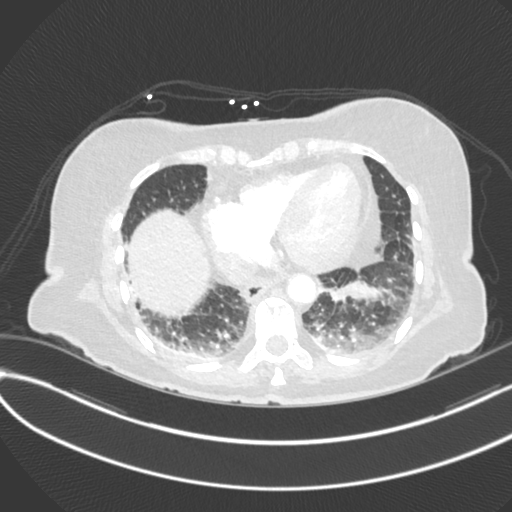
[im 144/431  soft-tissue]
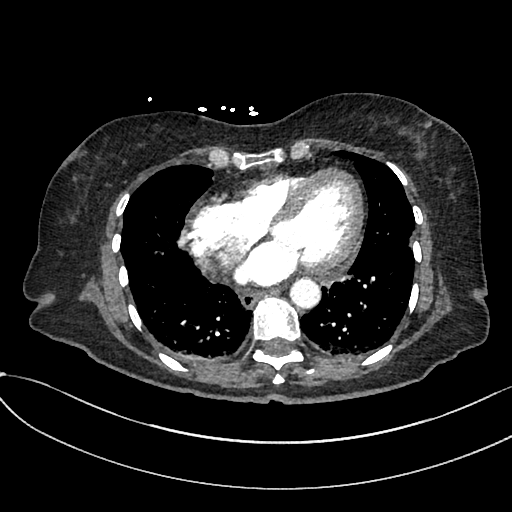
[im 168/431  lung]
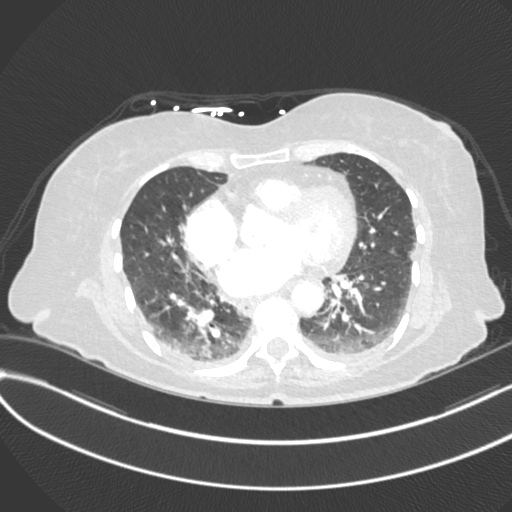
[im 192/431  soft-tissue]
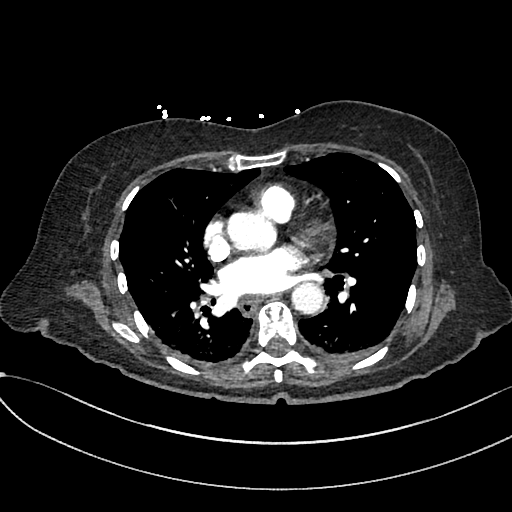
[im 239/431  lung]
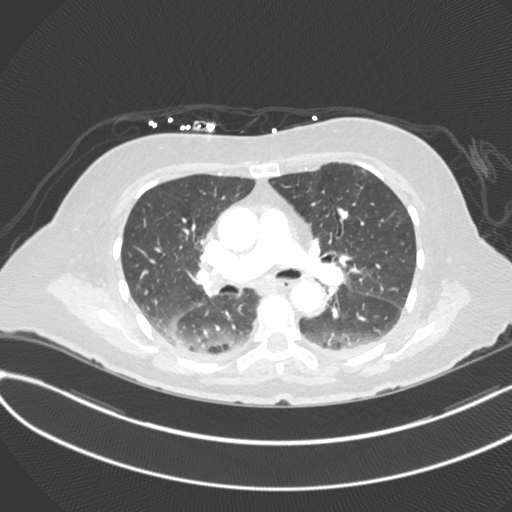
[im 263/431  soft-tissue]
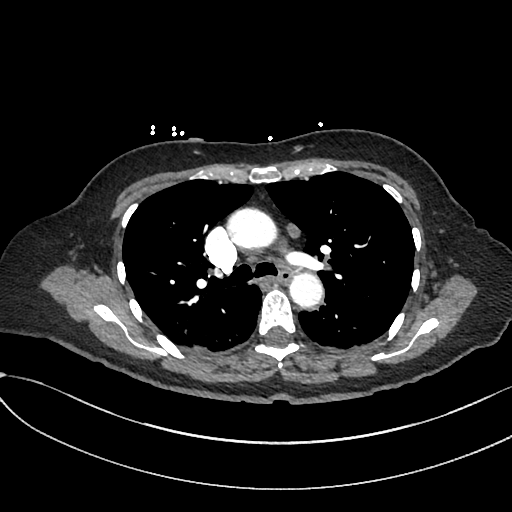
[im 287/431  lung]
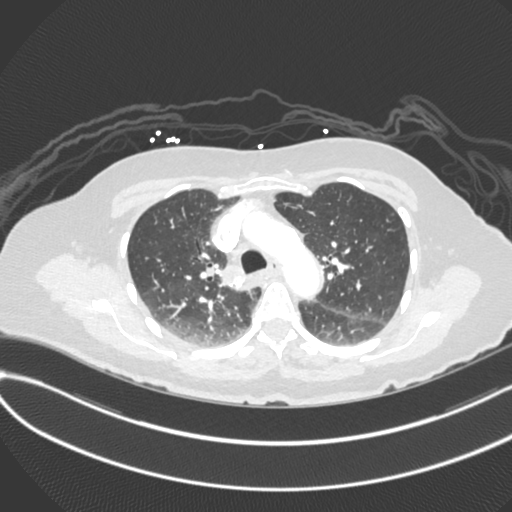
[im 311/431  soft-tissue]
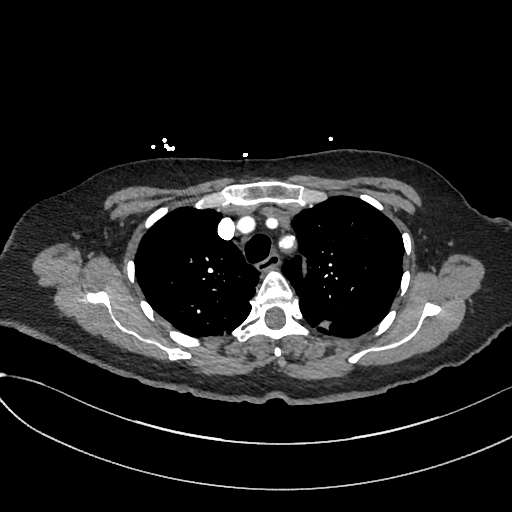
[im 335/431  lung]
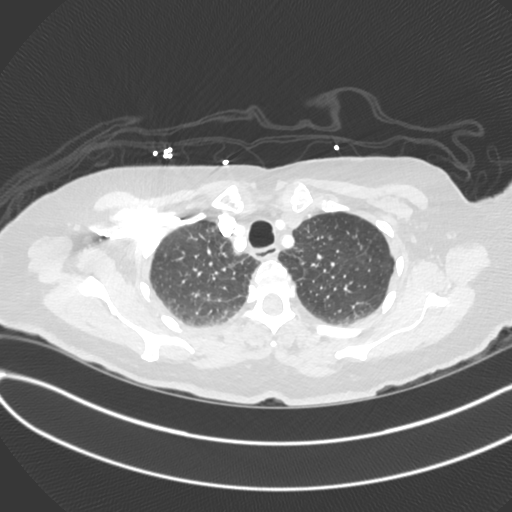
[im 359/431  soft-tissue]
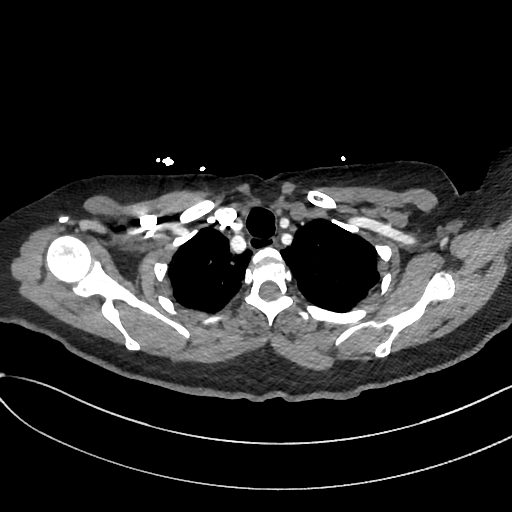
[im 383/431  lung]
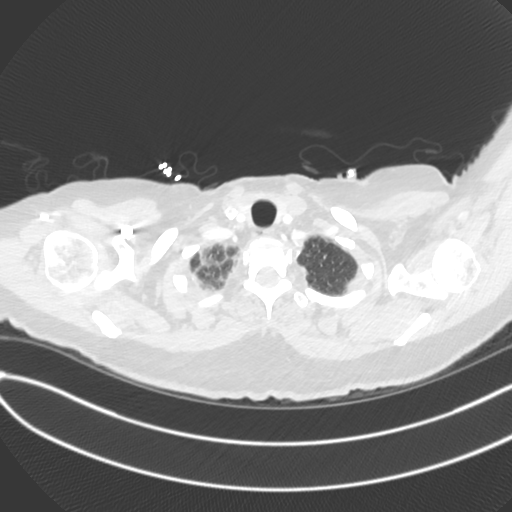
[im 407/431  soft-tissue]
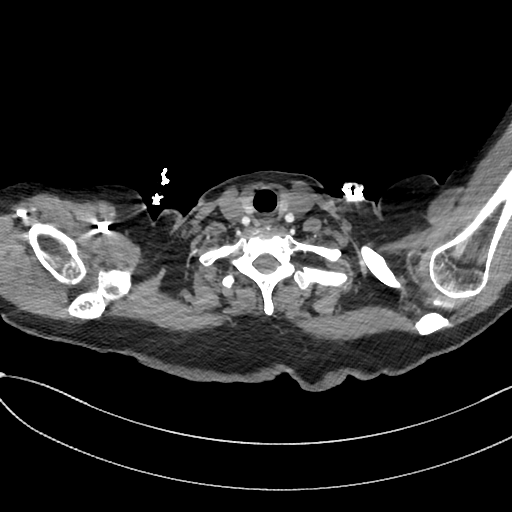

[Series 8: cor · coronal · 0.58mm/px · 3 of 117 slices shown]
[im 30/117  soft-tissue]
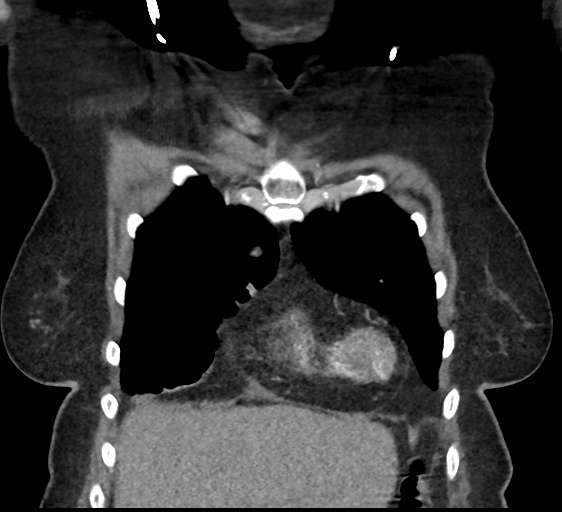
[im 59/117  soft-tissue]
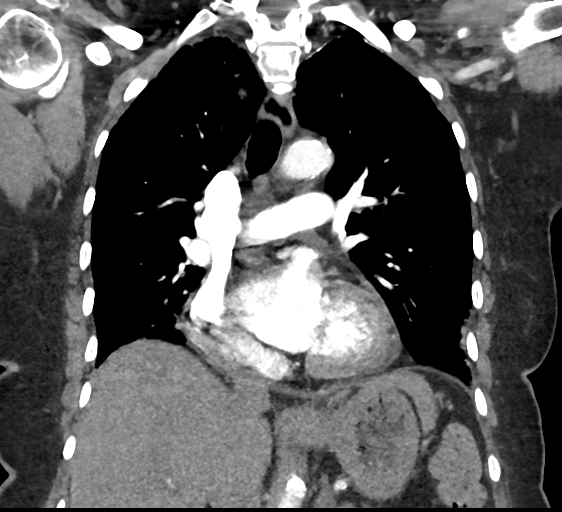
[im 88/117  soft-tissue]
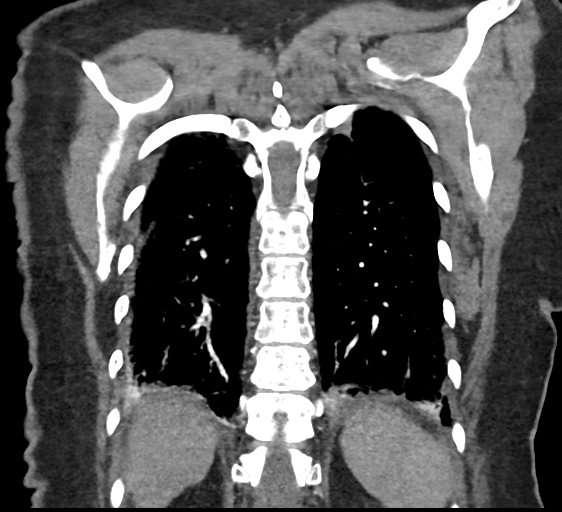

[19 of 46 positions shown; findings below may reference images not displayed]

FINDINGS: Cardiovascular: Satisfactory opacification of the pulmonary arteries
to the segmental level. No evidence of pulmonary embolism. The
thoracic aorta is normal in caliber. Normal heart size. No
pericardial effusion.

Mediastinum/Nodes: No pathologically enlarged mediastinal, hilar, or
axillary lymph nodes. Prominent AP window lymph node measures 7 mm,
within normal limits. The thyroid gland appears normal.

Lungs/Pleura: No pleural effusion. No pneumothorax. Subsegmental
atelectasis in the dependent aspects of the lungs. No suspicious
pulmonary nodules.

Musculoskeletal: No aggressive osseous lesions.

Upper abdomen: The visualized upper abdomen is unremarkable.

Review of the MIP images confirms the above findings.
IMPRESSION: No evidence of pulmonary embolism.

Subsegmental atelectasis in the dependent aspects of the lungs
without other acute findings in the chest.

## 2023-05-01 IMAGING — DX DG CHEST 1V PORT
1 series · 1 of 1 positions shown · non-contrast
Comparison: 11/19/2020

CLINICAL DATA: Abnormal respiration.

EXAM:
PORTABLE CHEST 1 VIEW

[chest]
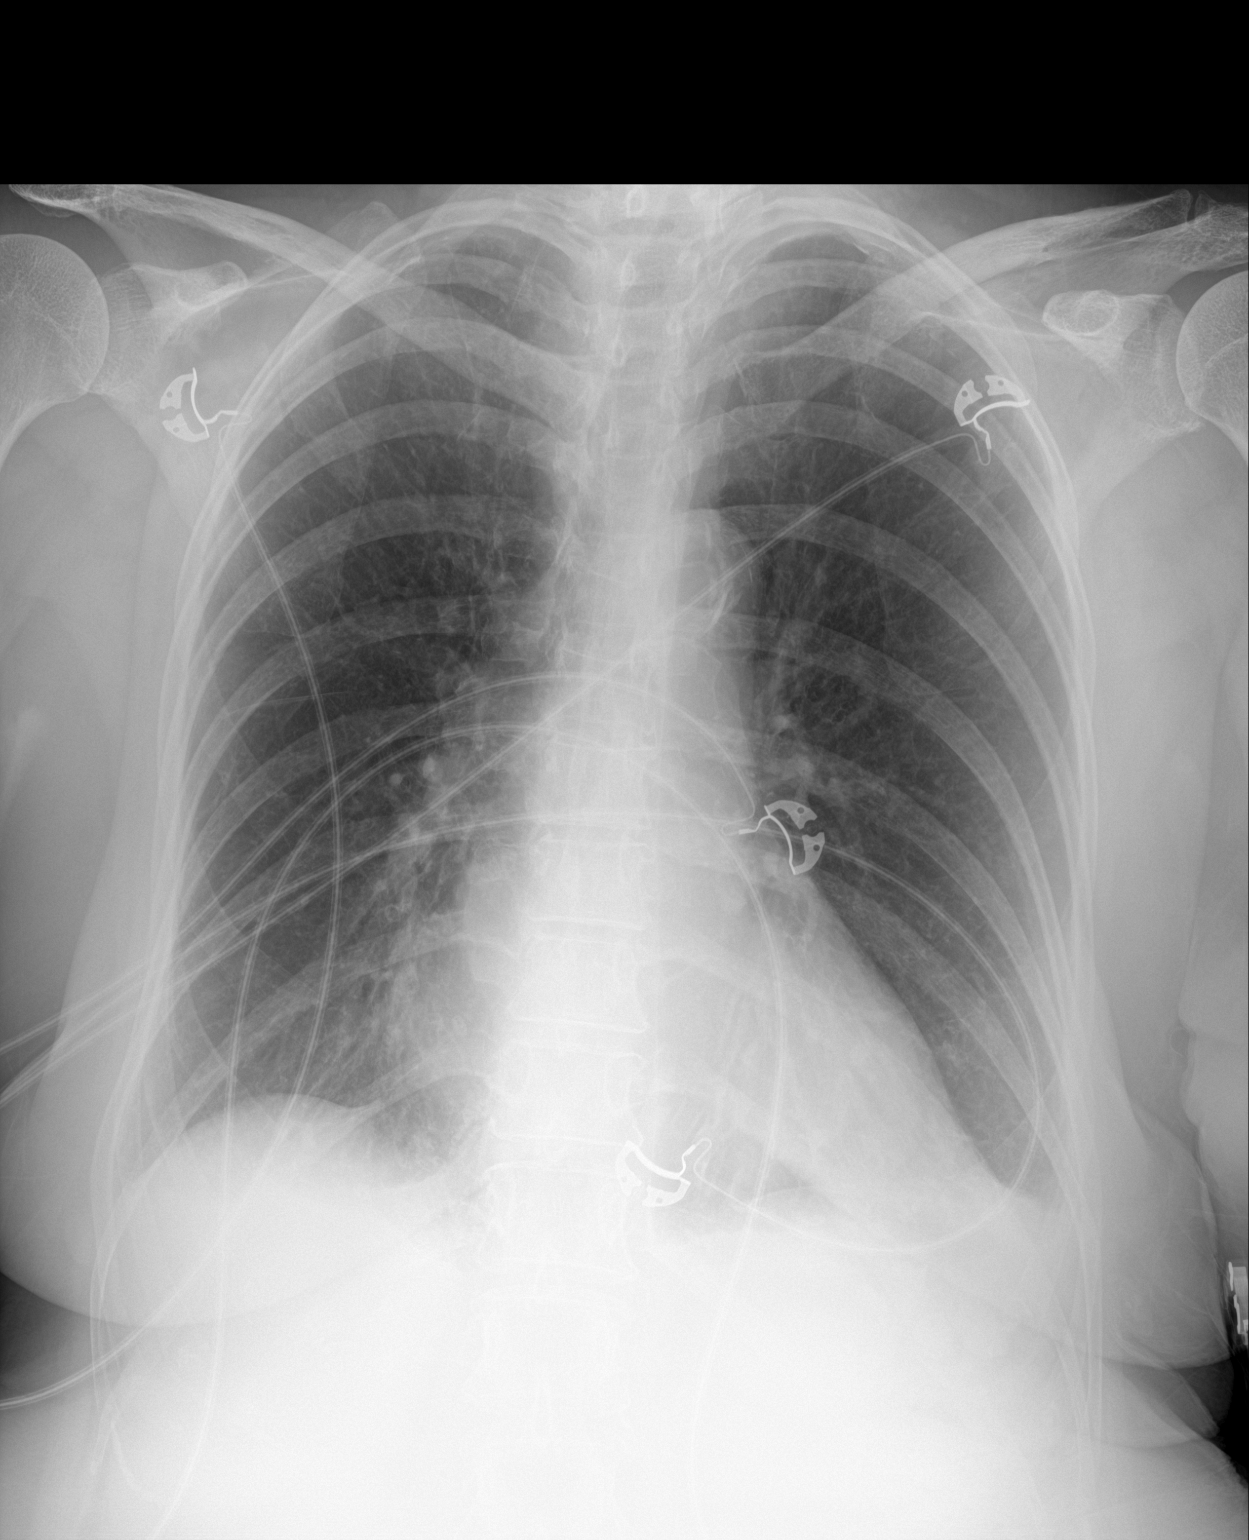

[1 of 1 positions shown; findings below may reference images not displayed]

FINDINGS: 5150 hours. The lungs are clear without focal pneumonia, edema,
pneumothorax or pleural effusion. Basilar atelectasis noted
bilaterally. Cardiopericardial silhouette is at upper limits of
normal for size. The visualized bony structures of the thorax show
no acute abnormality. Telemetry leads overlie the chest.
IMPRESSION: Basilar atelectasis.  No other acute cardiopulmonary finding.
